# Patient Record
Sex: Female | Born: 1965
Health system: Southern US, Community
[De-identification: ages and names within clinical notes are randomized; demographics above are authoritative.]

## PROBLEM LIST (undated history)

## (undated) DIAGNOSIS — G8929 Other chronic pain: Secondary | ICD-10-CM

## (undated) DIAGNOSIS — K219 Gastro-esophageal reflux disease without esophagitis: Secondary | ICD-10-CM

## (undated) DIAGNOSIS — I471 Supraventricular tachycardia, unspecified: Secondary | ICD-10-CM

## (undated) DIAGNOSIS — I499 Cardiac arrhythmia, unspecified: Secondary | ICD-10-CM

## (undated) DIAGNOSIS — J45909 Unspecified asthma, uncomplicated: Secondary | ICD-10-CM

## (undated) DIAGNOSIS — J329 Chronic sinusitis, unspecified: Secondary | ICD-10-CM

## (undated) DIAGNOSIS — I4891 Unspecified atrial fibrillation: Secondary | ICD-10-CM

## (undated) DIAGNOSIS — J069 Acute upper respiratory infection, unspecified: Secondary | ICD-10-CM

## (undated) DIAGNOSIS — F419 Anxiety disorder, unspecified: Secondary | ICD-10-CM

## (undated) DIAGNOSIS — M1990 Unspecified osteoarthritis, unspecified site: Secondary | ICD-10-CM

## (undated) HISTORY — PX: TUBAL LIGATION: SHX77

## (undated) HISTORY — DX: Supraventricular tachycardia: I47.1

## (undated) HISTORY — DX: Chronic sinusitis, unspecified: J32.9

## (undated) HISTORY — DX: Anxiety disorder, unspecified: F41.9

## (undated) HISTORY — PX: CHOLECYSTECTOMY: SHX55

## (undated) HISTORY — DX: Acute upper respiratory infection, unspecified: J06.9

## (undated) HISTORY — DX: Other chronic pain: G89.29

## (undated) HISTORY — DX: Supraventricular tachycardia, unspecified: I47.10

---

## 2004-01-10 ENCOUNTER — Ambulatory Visit: Payer: Self-pay | Admitting: Cardiology

## 2004-01-30 ENCOUNTER — Ambulatory Visit: Payer: Self-pay | Admitting: Cardiology

## 2004-01-31 ENCOUNTER — Ambulatory Visit: Payer: Self-pay | Admitting: Internal Medicine

## 2004-02-12 ENCOUNTER — Ambulatory Visit: Payer: Self-pay | Admitting: Cardiology

## 2004-02-19 ENCOUNTER — Ambulatory Visit (HOSPITAL_COMMUNITY): Admission: RE | Admit: 2004-02-19 | Discharge: 2004-02-19 | Payer: Self-pay | Admitting: Internal Medicine

## 2004-02-19 ENCOUNTER — Ambulatory Visit: Payer: Self-pay | Admitting: Internal Medicine

## 2004-05-14 ENCOUNTER — Ambulatory Visit: Payer: Self-pay | Admitting: Internal Medicine

## 2004-10-16 ENCOUNTER — Ambulatory Visit: Payer: Self-pay | Admitting: Cardiology

## 2006-04-08 ENCOUNTER — Emergency Department (HOSPITAL_COMMUNITY): Admission: EM | Admit: 2006-04-08 | Discharge: 2006-04-08 | Payer: Self-pay | Admitting: Emergency Medicine

## 2010-07-30 ENCOUNTER — Encounter: Payer: Self-pay | Admitting: Physician Assistant

## 2010-07-30 DIAGNOSIS — E785 Hyperlipidemia, unspecified: Secondary | ICD-10-CM | POA: Insufficient documentation

## 2010-07-30 DIAGNOSIS — E1010 Type 1 diabetes mellitus with ketoacidosis without coma: Secondary | ICD-10-CM | POA: Insufficient documentation

## 2010-07-30 DIAGNOSIS — G43909 Migraine, unspecified, not intractable, without status migrainosus: Secondary | ICD-10-CM | POA: Insufficient documentation

## 2010-08-22 ENCOUNTER — Emergency Department (HOSPITAL_COMMUNITY)
Admission: EM | Admit: 2010-08-22 | Discharge: 2010-08-22 | Disposition: A | Discharge status: Home / Self Care | Payer: Medicare Other | Attending: Emergency Medicine | Admitting: Emergency Medicine

## 2010-08-22 ENCOUNTER — Emergency Department (HOSPITAL_COMMUNITY): Payer: Medicare Other

## 2010-08-22 DIAGNOSIS — E109 Type 1 diabetes mellitus without complications: Secondary | ICD-10-CM | POA: Insufficient documentation

## 2010-08-22 DIAGNOSIS — Z794 Long term (current) use of insulin: Secondary | ICD-10-CM | POA: Insufficient documentation

## 2010-08-22 DIAGNOSIS — Z79899 Other long term (current) drug therapy: Secondary | ICD-10-CM | POA: Insufficient documentation

## 2010-08-22 DIAGNOSIS — J45909 Unspecified asthma, uncomplicated: Secondary | ICD-10-CM | POA: Insufficient documentation

## 2010-08-22 DIAGNOSIS — J40 Bronchitis, not specified as acute or chronic: Secondary | ICD-10-CM | POA: Insufficient documentation

## 2010-08-22 LAB — DIFFERENTIAL
Basophils Absolute: 0.1 10*3/uL (ref 0.0–0.1)
Basophils Relative: 2 % — ABNORMAL HIGH (ref 0–1)
Eosinophils Absolute: 0.2 10*3/uL (ref 0.0–0.7)
Neutrophils Relative %: 55 % (ref 43–77)

## 2010-08-22 LAB — GLUCOSE, CAPILLARY
Glucose-Capillary: 315 mg/dL — ABNORMAL HIGH (ref 70–99)
Glucose-Capillary: 195 mg/dL — ABNORMAL HIGH (ref 70–99)

## 2010-08-22 LAB — BASIC METABOLIC PANEL
BUN: 8 mg/dL (ref 6–23)
Calcium: 8.6 mg/dL (ref 8.4–10.5)
GFR calc non Af Amer: >60 mL/min (ref >60)
Glucose, Bld: 349 mg/dL — ABNORMAL HIGH (ref 70–99)

## 2010-08-22 LAB — URINALYSIS, ROUTINE W REFLEX MICROSCOPIC
Bilirubin Urine: NEGATIVE
Ketones, ur: >80 mg/dL — AB
Nitrite: NEGATIVE
Urobilinogen, UA: 0.2 mg/dL (ref 0.0–1.0)

## 2010-08-22 LAB — CBC
HCT: 33.2 % — ABNORMAL LOW (ref 36.0–46.0)
MCHC: 33.7 g/dL (ref 30.0–36.0)
MCV: 88.1 fL (ref 78.0–100.0)
RDW: 13.6 % (ref 11.5–15.5)

## 2010-08-27 ENCOUNTER — Encounter: Payer: Self-pay | Admitting: Physician Assistant

## 2010-12-16 ENCOUNTER — Institutional Professional Consult (permissible substitution): Payer: Medicare Other | Admitting: Cardiovascular Disease

## 2010-12-19 ENCOUNTER — Other Ambulatory Visit: Payer: Self-pay

## 2010-12-19 ENCOUNTER — Emergency Department (HOSPITAL_COMMUNITY)
Admission: EM | Admit: 2010-12-19 | Discharge: 2010-12-19 | Disposition: A | Discharge status: Home / Self Care | Payer: PRIVATE HEALTH INSURANCE | Attending: Emergency Medicine | Admitting: Emergency Medicine

## 2010-12-19 ENCOUNTER — Emergency Department (HOSPITAL_COMMUNITY): Payer: PRIVATE HEALTH INSURANCE

## 2010-12-19 ENCOUNTER — Encounter (HOSPITAL_COMMUNITY): Payer: Self-pay

## 2010-12-19 DIAGNOSIS — G8929 Other chronic pain: Secondary | ICD-10-CM | POA: Insufficient documentation

## 2010-12-19 DIAGNOSIS — E1169 Type 2 diabetes mellitus with other specified complication: Secondary | ICD-10-CM | POA: Insufficient documentation

## 2010-12-19 DIAGNOSIS — R112 Nausea with vomiting, unspecified: Secondary | ICD-10-CM | POA: Insufficient documentation

## 2010-12-19 DIAGNOSIS — IMO0001 Reserved for inherently not codable concepts without codable children: Secondary | ICD-10-CM | POA: Insufficient documentation

## 2010-12-19 DIAGNOSIS — R079 Chest pain, unspecified: Secondary | ICD-10-CM | POA: Insufficient documentation

## 2010-12-19 DIAGNOSIS — G43909 Migraine, unspecified, not intractable, without status migrainosus: Secondary | ICD-10-CM | POA: Insufficient documentation

## 2010-12-19 DIAGNOSIS — F411 Generalized anxiety disorder: Secondary | ICD-10-CM | POA: Insufficient documentation

## 2010-12-19 DIAGNOSIS — Z79899 Other long term (current) drug therapy: Secondary | ICD-10-CM | POA: Insufficient documentation

## 2010-12-19 DIAGNOSIS — E162 Hypoglycemia, unspecified: Secondary | ICD-10-CM

## 2010-12-19 DIAGNOSIS — Z794 Long term (current) use of insulin: Secondary | ICD-10-CM | POA: Insufficient documentation

## 2010-12-19 LAB — CARDIAC PANEL(CRET KIN+CKTOT+MB+TROPI)
Relative Index: INVALID (ref 0.0–2.5)
Troponin I: <0.3 ng/mL (ref <0.30)

## 2010-12-19 LAB — BASIC METABOLIC PANEL
CO2: 26 mEq/L (ref 19–32)
Calcium: 8.7 mg/dL (ref 8.4–10.5)
Chloride: 100 mEq/L (ref 96–112)
Glucose, Bld: 55 mg/dL — ABNORMAL LOW (ref 70–99)
Sodium: 135 mEq/L (ref 135–145)

## 2010-12-19 LAB — CBC
HCT: 36.6 % (ref 36.0–46.0)
MCV: 88.8 fL (ref 78.0–100.0)
Platelets: 355 10*3/uL (ref 150–400)
RBC: 4.12 MIL/uL (ref 3.87–5.11)
WBC: 10.3 10*3/uL (ref 4.0–10.5)

## 2010-12-19 LAB — GLUCOSE, CAPILLARY: Glucose-Capillary: 63 mg/dL — ABNORMAL LOW (ref 70–99)

## 2010-12-19 LAB — HEPATIC FUNCTION PANEL
ALT: 19 U/L (ref 0–35)
AST: 19 U/L (ref 0–37)
Albumin: 3.7 g/dL (ref 3.5–5.2)
Bilirubin, Direct: 0.1 mg/dL (ref 0.0–0.3)
Total Protein: 7.3 g/dL (ref 6.0–8.3)

## 2010-12-19 LAB — BLOOD GAS, VENOUS
Acid-Base Excess: 0.1 mmol/L (ref 0.0–2.0)
Bicarbonate: 25 mEq/L — ABNORMAL HIGH (ref 20.0–24.0)
O2 Saturation: 49.7 %
TCO2: 23.2 mmol/L (ref 0–100)
pO2, Ven: 27.1 mmHg — CL (ref 30.0–45.0)

## 2010-12-19 LAB — LIPASE, BLOOD: Lipase: 18 U/L (ref 11–59)

## 2010-12-19 LAB — DIFFERENTIAL
Eosinophils Relative: 0 % (ref 0–5)
Lymphocytes Relative: 14 % (ref 12–46)
Lymphs Abs: 1.5 10*3/uL (ref 0.7–4.0)

## 2010-12-19 MED ORDER — DEXTROSE 50 % IV SOLN
50.0000 mL | Freq: Once | INTRAVENOUS | Status: AC
  Administered 2010-12-19: 50 mL via INTRAVENOUS

## 2010-12-19 MED ORDER — SODIUM CHLORIDE 0.9 % IV SOLN
Freq: Once | INTRAVENOUS | Status: AC
  Administered 2010-12-19: 13:00:00 via INTRAVENOUS

## 2010-12-19 MED ORDER — DEXTROSE 50 % IV SOLN
INTRAVENOUS | Status: AC
  Filled 2010-12-19: qty 50

## 2010-12-19 MED ORDER — KETOROLAC TROMETHAMINE 30 MG/ML IJ SOLN
30.0000 mg | Freq: Once | INTRAMUSCULAR | Status: AC
  Administered 2010-12-19: 30 mg via INTRAVENOUS
  Filled 2010-12-19: qty 1

## 2010-12-19 MED ORDER — METOCLOPRAMIDE HCL 5 MG/ML IJ SOLN
10.0000 mg | Freq: Once | INTRAMUSCULAR | Status: AC
  Administered 2010-12-19: 10 mg via INTRAVENOUS
  Filled 2010-12-19: qty 2

## 2010-12-19 MED ORDER — DIPHENHYDRAMINE HCL 25 MG PO CAPS
25.0000 mg | ORAL_CAPSULE | Freq: Once | ORAL | Status: AC
  Administered 2010-12-19: 25 mg via ORAL
  Filled 2010-12-19: qty 1

## 2010-12-19 MED ORDER — ONDANSETRON HCL 4 MG/2ML IJ SOLN
4.0000 mg | Freq: Once | INTRAMUSCULAR | Status: AC
  Administered 2010-12-19: 4 mg via INTRAVENOUS
  Filled 2010-12-19: qty 2

## 2010-12-19 MED ORDER — SODIUM CHLORIDE 0.9 % IV BOLUS (SEPSIS)
1000.0000 mL | Freq: Once | INTRAVENOUS | Status: AC
  Administered 2010-12-19: 1000 mL via INTRAVENOUS

## 2010-12-19 NOTE — ED Notes (Signed)
Pt and family updated on plan of care,  

## 2010-12-19 NOTE — ED Notes (Signed)
Pt also states that she has been having chest pain off and on since having her mi three weeks ago, pt placed on cardiac monitor, ekg done

## 2010-12-19 NOTE — ED Notes (Signed)
Pt states that she has been having problems with her blood sugar going up and down  since having a mi three weeks ago at [facility] in [place], pt also has migraine headache that started this am along with n/v, pt dry heaving in tx room, attempted to keep oj down in order to bring her blood sugar up but has been unable to

## 2010-12-19 NOTE — ED Provider Notes (Signed)
History     CSN: [id number] Arrival date & time: 12/19/2010 12:54 PM  Chief Complaint  Patient presents with  . Emesis  . Headache  . Hypoglycemia    HPI  (Consider location/radiation/quality/duration/timing/severity/associated sxs/prior treatment)  HPI Comments: This is type I diabetic presenting with labile blood sugars for the past 2 days. She reports her blood sugar was 60 this morning and she was unable to tolerate orange juice to bring it up. She also complains of a typical migraine headache is exactly like her previous and gradual in onset. She's had multiple episodes of nausea and vomiting today unable to keep anything down. This is associated with diffuse abdominal pain and intermittent chest pain. Her sensation having intermittent chest pain for the past 3 weeks it comes and goes every few seconds. She states she had a heart attack in [facility] on September 8. However her discharge summary shows that she had atypical chest pain and was ruled out 3 negative cardiac enzymes. She did have DKA was admitted to the ICU insulin drip for 2 days. She reports compliance with her insulin dosage and took her regular 8 units of Lantus this morning as well as yesterday. She has had no by mouth intake today and has not taken any sliding scale.  The history is provided by the patient.    Past Medical History  Diagnosis Date  . Diabetes with ketoacidosis   . URI (upper respiratory infection)   . Sinusitis   . Chronic anxiety   . Chronic pain   . Diabetes mellitus     Past Surgical History  Procedure Date  . Tubal ligation     History reviewed. No pertinent family history.  History  Substance Use Topics  . Smoking status: Never Smoker   . Smokeless tobacco: Not on file  . Alcohol Use: No    OB History    Grav Para Term Preterm Abortions TAB SAB Ect Mult Living                  Review of Systems  Review of Systems  Constitutional: Positive for activity change,  appetite change and fatigue. Negative for fever.  HENT: Negative for congestion and rhinorrhea.   Eyes: Negative for visual disturbance.  Respiratory: Positive for chest tightness. Negative for cough and shortness of breath.   Cardiovascular: Positive for chest pain.  Gastrointestinal: Positive for vomiting.  Genitourinary: Negative for dysuria and hematuria.  Musculoskeletal: Positive for myalgias and arthralgias. Negative for back pain.  Neurological: Positive for headaches. Negative for dizziness and weakness.    Allergies  Review of patient's allergies indicates no known allergies.  Home Medications   Current Outpatient Rx  Name Route Sig Dispense Refill  . ALPRAZOLAM 0.5 MG PO TABS Oral Take 0.5 mg by mouth at bedtime as needed.      . ASPIRIN 81 MG PO TABS Oral Take 81 mg by mouth daily.      . ATORVASTATIN CALCIUM 40 MG PO TABS Oral Take 40 mg by mouth at bedtime as needed.      Marland Kitchen PRODIGY AUTOCODE BLOOD GLUCOSE DEVI Does not apply by Does not apply route 3 (three) times daily.      . ALLEGRA-D PO Oral Take by mouth.      Marland Kitchen GLUCOSE BLOOD VI STRP Other 1 each by Other route 3 (three) times daily. Use as instructed     . HYDROCHLOROTHIAZIDE 25 MG PO TABS Oral Take 25 mg by mouth daily.      Marland Kitchen  INSULIN ASPART 100 UNIT/ML [place] SOLN Subcutaneous Inject 4 Units into the skin 3 (three) times daily before meals. Sliding scale 4 units+     . INSULIN GLARGINE 100 UNIT/ML [place] SOLN Subcutaneous Inject 30 Units into the skin at bedtime.      Marland Kitchen LEVOXYL PO Oral Take 50 mcg by mouth every morning.      Marland Kitchen OMEPRAZOLE 20 MG PO CPDR Oral Take 20 mg by mouth daily.      . OXYCODONE-ACETAMINOPHEN 7.5-500 MG PO TABS Oral Take 1 tablet by mouth as directed.      Marland Kitchen PRODIGY LANCETS 21G MISC Does not apply by Does not apply route 3 (three) times daily.      Marland Kitchen SIMVASTATIN 40 MG PO TABS Oral Take 40 mg by mouth at bedtime.        Physical Exam    BP 129/83  Pulse 94  Temp(Src) 98.3 F (36.8 C) (Oral)   Resp 24  Ht 5\' 8"  (1.727 m)  Wt 190 lb (86.183 kg)  BMI 28.89 kg/m2  SpO2 100%  Physical Exam  Constitutional: She is oriented to person, place, and time. She appears well-developed and well-nourished. No distress.  HENT:  Head: Normocephalic and atraumatic.  Mouth/Throat: Oropharynx is clear and moist. No oropharyngeal exudate.  Eyes: Conjunctivae are normal. Pupils are equal, round, and reactive to light.  Neck: Normal range of motion.  Cardiovascular: Normal rate, regular rhythm and normal heart sounds.   Pulmonary/Chest: Effort normal and breath sounds normal. No respiratory distress.  Abdominal: Soft. There is no tenderness. There is no rebound and no guarding.  Musculoskeletal: Normal range of motion. She exhibits no edema and no tenderness.  Neurological: She is alert and oriented to person, place, and time. No cranial nerve deficit.       Cranial nerves II through XII intact, 5 of 5 strength throughout, no focal deficits  Skin: Skin is warm.    ED Course  Procedures (including critical care time)  Labs Reviewed  GLUCOSE, CAPILLARY - Abnormal; Notable for the following:    Glucose-Capillary 61 (*)    All other components within normal limits  DIFFERENTIAL - Abnormal; Notable for the following:    Neutrophils Relative 82 (*)    Neutro Abs 8.5 (*)    All other components within normal limits  CBC  BASIC METABOLIC PANEL  BLOOD GAS, VENOUS  LACTIC ACID, PLASMA  LIPASE, BLOOD  HEPATIC FUNCTION PANEL  I-STAT TROPONIN I   No results found.   No diagnosis found.   MDM Nausea, vomiting, hypoglycemia with body aches and typical migraine headaches. Patient is afebrile and stable vital signs. We'll obtain electrolytes, ABG, provide IV hydration antiemetics. Abdomen soft and nontender. Records obtained from [facility] show that she actually did not have a heart attack as she reports,, we will obtain EKG to evaluate her atypical chest pain.   Date: 12/19/2010  Rate:  70  Rhythm: normal sinus rhythm  QRS Axis: normal  Intervals: normal  ST/T Wave abnormalities: normal  Conduction Disutrbances:none  Narrative Interpretation:   Old EKG Reviewed: none available  Patient feeling better after fluids and antiemetics. She is actually requesting discharge. States her headache is resolved she's had no further chest pain. Sugar is in the low side but she is tolerating by mouth in the department. We'll recheck sugar here before discharge.  Last blood sugar was 100. Her electrolytes are within normal limits and her troponin is negative. Not concerned about cardiac cause for  chest pain as it has been bleeding every few seconds for 3 weeks. This is very typical for ACS and she has no EKG changes.  Results for orders placed during the hospital encounter of 12/19/10  GLUCOSE, CAPILLARY      Component Value Range   Glucose-Capillary 61 (*) 70 - 99 (mg/dL)  CBC      Component Value Range   WBC 10.3  4.0 - 10.5 (K/uL)   RBC 4.12  3.87 - 5.11 (MIL/uL)   Hemoglobin 12.6  12.0 - 15.0 (g/dL)   HCT 36.6  36.0 - 46.0 (%)   MCV 88.8  78.0 - 100.0 (fL)   MCH 30.6  26.0 - 34.0 (pg)   MCHC 34.4  30.0 - 36.0 (g/dL)   RDW 13.3  11.5 - 15.5 (%)   Platelets 355  150 - 400 (K/uL)  DIFFERENTIAL      Component Value Range   Neutrophils Relative 82 (*) 43 - 77 (%)   Neutro Abs 8.5 (*) 1.7 - 7.7 (K/uL)   Lymphocytes Relative 14  12 - 46 (%)   Lymphs Abs 1.5  0.7 - 4.0 (K/uL)   Monocytes Relative 3  3 - 12 (%)   Monocytes Absolute 0.3  0.1 - 1.0 (K/uL)   Eosinophils Relative 0  0 - 5 (%)   Eosinophils Absolute 0.0  0.0 - 0.7 (K/uL)   Basophils Relative 0  0 - 1 (%)   Basophils Absolute 0.0  0.0 - 0.1 (K/uL)  BASIC METABOLIC PANEL      Component Value Range   Sodium 135  135 - 145 (mEq/L)   Potassium 3.2 (*) 3.5 - 5.1 (mEq/L)   Chloride 100  96 - 112 (mEq/L)   CO2 26  19 - 32 (mEq/L)   Glucose, Bld 55 (*) 70 - 99 (mg/dL)   BUN 9  6 - 23 (mg/dL)   Creatinine, Ser 0.50  0.50  - 1.10 (mg/dL)   Calcium 8.7  8.4 - 10.5 (mg/dL)   GFR calc non Af Amer >60  >60 (mL/min)   GFR calc Af Amer >60  >60 (mL/min)  LACTIC ACID, PLASMA      Component Value Range   Lactic Acid, Venous 1.5  0.5 - 2.2 (mmol/L)  LIPASE, BLOOD      Component Value Range   Lipase 18  11 - 59 (U/L)  HEPATIC FUNCTION PANEL      Component Value Range   Total Protein 7.3  6.0 - 8.3 (g/dL)   Albumin 3.7  3.5 - 5.2 (g/dL)   AST 19  0 - 37 (U/L)   ALT 19  0 - 35 (U/L)   Alkaline Phosphatase 134 (*) 39 - 117 (U/L)   Total Bilirubin 0.2 (*) 0.3 - 1.2 (mg/dL)   Bilirubin, Direct 0.1  0.0 - 0.3 (mg/dL)   Indirect Bilirubin 0.1 (*) 0.3 - 0.9 (mg/dL)  BLOOD GAS, VENOUS      Component Value Range   FIO2 21.00     pH, Ven 7.371 (*) 7.250 - 7.300    pCO2, Ven 44.2 (*) 45.0 - 50.0 (mmHg)   pO2, Ven 27.1 (*) 30.0 - 45.0 (mmHg)   Bicarbonate 25.0 (*) 20.0 - 24.0 (mEq/L)   TCO2 23.2  0 - 100 (mmol/L)   Acid-Base Excess 0.1  0.0 - 2.0 (mmol/L)   O2 Saturation 49.7     Patient temperature 37.0     Collection site HEEL OF FOOT     Drawn by  OUTSIDE SERVICE OPLAB     Sample type VENOUS    CARDIAC PANEL(CRET KIN+CKTOT+MB+TROPI)      Component Value Range   Total CK 43  7 - 177 (U/L)   CK, MB 1.6  0.3 - 4.0 (ng/mL)   Troponin I <0.30  <0.30 (ng/mL)   Relative Index RELATIVE INDEX IS INVALID  0.0 - 2.5   GLUCOSE, CAPILLARY      Component Value Range   Glucose-Capillary 100 (*) 70 - 99 (mg/dL)   Dg Chest Portable 1 View  12/19/2010  *RADIOLOGY REPORT*  Clinical Data: Chest pain  PORTABLE CHEST - 1 VIEW  Comparison: 08/22/2010  Findings: Heart size upper limits normal.  Lungs are clear.  No effusion.  Regional bones unremarkable.  Azygos fissure noted.  IMPRESSION:  1.  Borderline cardiomegaly.  Original Report Authenticated By: Trecia Rogers, M.D.           Ezequiel Essex, MD 12/19/10 [phone]

## 2010-12-19 NOTE — ED Notes (Signed)
Lab called with results of venous blood gas, EDP informed of results

## 2010-12-19 NOTE — ED Notes (Signed)
Pt brought in by mother for hypoglycemia and headache. Per mother pt glucose was 60 this am and pt has vomited OJ that pt attempted to drink. Pt to triage via w/c.

## 2010-12-19 NOTE — ED Notes (Signed)
EDP aware of blood sugar, pt given coke and clear liquids

## 2010-12-19 NOTE — ED Notes (Signed)
Pt states that she is feeling better,  

## 2010-12-19 NOTE — ED Notes (Signed)
Pt trop labwork done in er showed stars across the top, EDP advised to have lab do cardiac panel

## 2010-12-19 NOTE — ED Notes (Signed)
bsl 100

## 2011-01-04 ENCOUNTER — Encounter: Payer: Self-pay | Admitting: Cardiovascular Disease

## 2011-01-04 ENCOUNTER — Institutional Professional Consult (permissible substitution): Payer: Medicare Other | Admitting: Cardiovascular Disease

## 2011-01-04 ENCOUNTER — Ambulatory Visit (INDEPENDENT_AMBULATORY_CARE_PROVIDER_SITE_OTHER): Payer: Medicare Other | Admitting: Cardiovascular Disease

## 2011-01-04 DIAGNOSIS — R079 Chest pain, unspecified: Secondary | ICD-10-CM | POA: Insufficient documentation

## 2011-01-04 NOTE — Assessment & Plan Note (Signed)
Daily chest pain in a diabetic female. Will get an exercise myoview to exclude ischemia and echo to exclude structural heart disease.

## 2011-01-04 NOTE — Progress Notes (Signed)
History of Present Illness:44 yo WF with h/o DM, SVT s/p ablation per Dr. Lovena Le in 2005  who is here today as a new patient for cardiac evaluation. She was hospitalized at [facility] in [place], [place] with diabetic ketoacidosis 4 weeks ago. She had some chest pain during that admission and at her f/u appt in [place] Keeler, PA-C), she asked for cardiology referral.   She tells me that she has daily chest pain. The pain seems to worsen with exertion. This is described as a pressure. No radiation. Her left arm goes numb at times. Occasional SOB. She also has occasional palpitations. No prolonged runs of tachycardia. She has been on a PPI in the past for possible GERD but no help with her chest pressure.   Past Medical History  Diagnosis Date  . Diabetes with ketoacidosis   . URI (upper respiratory infection)   . Sinusitis   . Chronic anxiety   . Chronic pain   . Diabetes mellitus     Diagnosed 1997  . SVT (supraventricular tachycardia)     Ablation per Dr. Lovena Le in November 2005    Past Surgical History  Procedure Date  . Tubal ligation     Current Outpatient Prescriptions  Medication Sig Dispense Refill  . aspirin 81 MG tablet Take 81 mg by mouth daily.        . Blood Glucose Monitoring Suppl (PRODIGY AUTOCODE BLOOD GLUCOSE) DEVI by Does not apply route 3 (three) times daily.        Marland Kitchen glucose blood (PRODIGY AUTOCODE TEST) test strip 1 each by Other route 3 (three) times daily. Use as instructed       . insulin aspart (NOVOLOG) 100 UNIT/ML injection Inject 4 Units into the skin 3 (three) times daily before meals. Sliding scale 4 units+       . insulin glargine (LANTUS) 100 UNIT/ML injection Inject 30 Units into the skin at bedtime.        Marland Kitchen PRODIGY LANCETS 21G MISC by Does not apply route 3 (three) times daily.          No Known Allergies  History   Social History  . Marital Status: Married    Spouse Name: N/A    Number of Children: N/A  .  Years of Education: N/A   Occupational History  . Not on file.   Social History Main Topics  . Smoking status: Never Smoker   . Smokeless tobacco: Not on file  . Alcohol Use: No  . Drug Use: No  . Sexually Active:    Other Topics Concern  . Not on file   Social History Narrative  . No narrative on file    No family history on file.  Review of Systems:  As stated in the HPI and otherwise negative.   BP 120/70  Pulse 65  Resp 18  Ht 5\' 8"  (1.727 m)  Wt 178 lb 1.9 oz (80.795 kg)  BMI 27.08 kg/m2  Physical Examination: General: Well developed, well nourished, NAD HEENT: OP clear, mucus membranes moist SKIN: warm, dry. No rashes. Neuro: No focal deficits Musculoskeletal: Muscle strength 5/5 all ext Psychiatric: Mood and affect normal Neck: No JVD, no carotid bruits, no thyromegaly, no lymphadenopathy. Lungs:Clear bilaterally, no wheezes, rhonci, crackles Cardiovascular: Regular rate and rhythm. No murmurs, gallops or rubs. Abdomen:Soft. Bowel sounds present. Non-tender.  Extremities: No lower extremity edema. Pulses are 2 + in the bilateral DP/PT.  EKG: Normal sinus rhythm, rate 65.

## 2011-01-04 NOTE — Patient Instructions (Signed)
Your physician recommends that you schedule a follow-up appointment in:2-3 weeks  Your physician has requested that you have en exercise stress myoview. For further information please visit [email or web address]. Please follow instruction sheet, as given.  Your physician has requested that you have an echocardiogram. Echocardiography is a painless test that uses sound waves to create images of your heart. It provides your doctor with information about the size and shape of your heart and how well your heart's chambers and valves are working. This procedure takes approximately one hour. There are no restrictions for this procedure.

## 2011-02-01 ENCOUNTER — Ambulatory Visit (HOSPITAL_COMMUNITY): Payer: PRIVATE HEALTH INSURANCE | Attending: Cardiology | Admitting: Radiology

## 2011-02-01 ENCOUNTER — Other Ambulatory Visit (HOSPITAL_COMMUNITY): Payer: Medicare Other | Admitting: Radiology

## 2011-02-01 ENCOUNTER — Encounter (HOSPITAL_COMMUNITY): Payer: Medicare Other | Admitting: Radiology

## 2011-02-01 DIAGNOSIS — I379 Nonrheumatic pulmonary valve disorder, unspecified: Secondary | ICD-10-CM | POA: Insufficient documentation

## 2011-02-01 DIAGNOSIS — I059 Rheumatic mitral valve disease, unspecified: Secondary | ICD-10-CM | POA: Insufficient documentation

## 2011-02-01 DIAGNOSIS — R0989 Other specified symptoms and signs involving the circulatory and respiratory systems: Secondary | ICD-10-CM | POA: Insufficient documentation

## 2011-02-01 DIAGNOSIS — I498 Other specified cardiac arrhythmias: Secondary | ICD-10-CM | POA: Insufficient documentation

## 2011-02-01 DIAGNOSIS — R002 Palpitations: Secondary | ICD-10-CM | POA: Insufficient documentation

## 2011-02-01 DIAGNOSIS — I079 Rheumatic tricuspid valve disease, unspecified: Secondary | ICD-10-CM | POA: Insufficient documentation

## 2011-02-01 DIAGNOSIS — R0789 Other chest pain: Secondary | ICD-10-CM

## 2011-02-01 DIAGNOSIS — E119 Type 2 diabetes mellitus without complications: Secondary | ICD-10-CM | POA: Insufficient documentation

## 2011-02-01 DIAGNOSIS — R072 Precordial pain: Secondary | ICD-10-CM

## 2011-02-01 DIAGNOSIS — R0602 Shortness of breath: Secondary | ICD-10-CM

## 2011-02-01 DIAGNOSIS — R0609 Other forms of dyspnea: Secondary | ICD-10-CM | POA: Insufficient documentation

## 2011-02-01 DIAGNOSIS — R079 Chest pain, unspecified: Secondary | ICD-10-CM | POA: Insufficient documentation

## 2011-02-01 MED ORDER — TECHNETIUM TC 99M TETROFOSMIN IV KIT
11.0000 | PACK | Freq: Once | INTRAVENOUS | Status: AC | PRN
  Administered 2011-02-01: 11 via INTRAVENOUS

## 2011-02-01 MED ORDER — TECHNETIUM TC 99M TETROFOSMIN IV KIT
33.0000 | PACK | Freq: Once | INTRAVENOUS | Status: AC | PRN
  Administered 2011-02-01: 33 via INTRAVENOUS

## 2011-02-01 NOTE — Progress Notes (Signed)
[place] [address] [phone]  Cardiology Nuclear Med Study  Kathleen Norris is a 44 y.o. female [id number] 13-Aug-1965   Nuclear Med Background Indication for Stress Test:  Evaluation for Ischemia and 9/12 [facility]: Diabetic Ketoacidosis and Chest Pain, MI ruled out [name]) History:  No previous documented CAD, '05 Ablation (SVT), and '00 Myocardial Perfusion Study ([facility])No Report Cardiac Risk Factors: Family History - CAD, IDDM Type 1, and TIA  Symptoms:  Chest Pain, and Chest Pressure with and without Exertion (Chronic x 2 1/2 months,(last date of chest discomfort Today), DOE, Fatigue, Fatigue with Exertion, Nausea, Palpitations and SOB   Nuclear Pre-Procedure Caffeine/Decaff Intake:  None NPO After: 11:00pm   Lungs:  Clear IV 0.9% NS with Angio Cath:  22g  IV Site: R Hand  IV Started by:  Eliezer Lofts, EMT-P  Chest Size (in):  38 Cup Size: C  Height: 5\' 8"  (1.727 m)  Weight:  178 lb (80.74 kg)  BMI:  Body mass index is 27.06 kg/(m^2). Tech Comments:  CBG= 382 @ 6 am, per patient.    Nuclear Med Study 1 or 2 day study: 1 day  Stress Test Type:  Stress  Reading MD: Kirk Ruths, MD  Order Authorizing Provider:  Darlina Guys, MD  Resting Radionuclide: Technetium 6m Tetrofosmin  Resting Radionuclide Dose: 11 mCi   Stress Radionuclide:  Technetium 94m Tetrofosmin  Stress Radionuclide Dose: 33 mCi           Stress Protocol Rest HR: 70 Stress HR: 169  Rest BP: 128/87 Stress BP: 156/83  Exercise Time (min): 7:30 METS: 9.3   Predicted Max HR: 177 bpm % Max HR: 95.48 bpm Rate Pressure Product: [phone]   Dose of Adenosine (mg):  n/a Dose of Lexiscan: n/a mg  Dose of Atropine (mg): n/a Dose of Dobutamine: n/a mcg/kg/min (at max HR)  Stress Test Technologist: Irven Baltimore, RN  Nuclear Technologist:  Charlton Amor, CNMT     Rest Procedure:  Myocardial perfusion imaging was performed at rest 45  minutes following the intravenous administration of Technetium 50m Tetrofosmin. Rest ECG: NSR with Poor R wave Progression  Stress Procedure:  The patient exercised for 7 minutes and 30 seconds, RPE=15. The patient stopped due to DOE, Fatigue and Chest Pain at baseline 7.5/10 (chronic) with gradual increase at peak exercise to 8/10.  There were nonspecific ST-T wave changes. There was a rare PAC. There was a decrease BP to 148/71 immediately post exercise, without dizziness.  Technetium 49m Tetrofosmin was injected at peak exercise and myocardial perfusion imaging was performed after a brief delay. Stress ECG: Significant ST abnormalities consistent with ischemia.  QPS Raw Data Images:  Acquisition technically good; normal left ventricular size. Stress Images:  There is decreased uptake in the distal anterior wall and apex. Rest Images:  There is decreased uptake in the distal anterior wall. Subtraction (SDS):  These findings are consistent with soft tissue attenuation; cannot exclude very mild apical ischemia. Transient Ischemic Dilatation (Normal <1.22):  1.14 Lung/Heart Ratio (Normal <0.45):  .26  Quantitative Gated Spect Images QGS EDV:  92 ml QGS ESV:  39 ml QGS cine images:  NL LV Function; NL Wall Motion QGS EF: 57%  Impression Exercise Capacity:  Fair exercise capacity. BP Response:  Normal blood pressure response. Clinical Symptoms:  There is chest pain. ECG Impression:  Significant ST abnormalities consistent with ischemia. Comparison with Prior Nuclear Study: No images to compare  Overall Impression:  Low risk stress nuclear study with a small, partially reversible defect in the distal anterior wall and apex consistent with soft tissue attenuation; cannot exclude very mild apical ischemia.   Kirk Ruths

## 2011-02-09 ENCOUNTER — Ambulatory Visit: Payer: Self-pay | Admitting: Cardiovascular Disease

## 2011-02-12 ENCOUNTER — Encounter: Payer: Self-pay | Admitting: *Deleted

## 2011-02-12 ENCOUNTER — Ambulatory Visit (INDEPENDENT_AMBULATORY_CARE_PROVIDER_SITE_OTHER): Payer: PRIVATE HEALTH INSURANCE | Admitting: Cardiovascular Disease

## 2011-02-12 ENCOUNTER — Ambulatory Visit: Payer: Medicare Other | Admitting: Cardiovascular Disease

## 2011-02-12 ENCOUNTER — Encounter: Payer: Self-pay | Admitting: Cardiovascular Disease

## 2011-02-12 VITALS — BP 116/73 | HR 80 | Ht 68.0 in | Wt 181.0 lb

## 2011-02-12 DIAGNOSIS — R079 Chest pain, unspecified: Secondary | ICD-10-CM

## 2011-02-12 DIAGNOSIS — R9439 Abnormal result of other cardiovascular function study: Secondary | ICD-10-CM

## 2011-02-12 LAB — CBC WITH DIFFERENTIAL/PLATELET
Basophils Relative: 0.6 % (ref 0.0–3.0)
Eosinophils Absolute: 0.1 10*3/uL (ref 0.0–0.7)
HCT: 39.2 % (ref 36.0–46.0)
Lymphs Abs: 2 10*3/uL (ref 0.7–4.0)
MCHC: 33.9 g/dL (ref 30.0–36.0)
MCV: 91.4 fl (ref 78.0–100.0)
Monocytes Absolute: 0.4 10*3/uL (ref 0.1–1.0)
Neutrophils Relative %: 60.4 % (ref 43.0–77.0)
RBC: 4.29 Mil/uL (ref 3.87–5.11)

## 2011-02-12 LAB — BASIC METABOLIC PANEL
BUN: 10 mg/dL (ref 6–23)
CO2: 26 mEq/L (ref 19–32)
Calcium: 8.7 mg/dL (ref 8.4–10.5)
GFR: 149.33 mL/min (ref >60.00)
Glucose, Bld: 161 mg/dL — ABNORMAL HIGH (ref 70–99)

## 2011-02-12 NOTE — Assessment & Plan Note (Signed)
See above

## 2011-02-12 NOTE — Progress Notes (Signed)
History of Present Illness: 45 yo WF with h/o DM, SVT s/p ablation per Dr. Lovena Le in 2005 who is here today for cardiac follow up. I saw her as a new patient one month ago for w/u of chest pain.  She was hospitalized at [facility] in [place], [place] with diabetic ketoacidosis in September 2012.  She had some chest pain during that admission and at her f/u appt in [place] Worthy Keeler, PA-C), she asked for cardiology referral.  She told  me that she has daily chest pain. The pain seems to worsen with exertion. This is described as a pressure. No radiation. Her left arm goes numb at times. Occasional SOB. She also has occasional palpitations. No prolonged runs of tachycardia. She has been on a PPI in the past for possible GERD but no help with her chest pressure. I ordered an echo which was normal. Her stress myoview showed possible apical ischemia with normal LVEF and wall motion.  She is here today for follow up and tells me that she is still having chest pain. It is constant and never goes away. Slight worsening with exertion.    Past Medical History  Diagnosis Date  . Diabetes with ketoacidosis   . URI (upper respiratory infection)   . Sinusitis   . Chronic anxiety   . Chronic pain   . Diabetes mellitus     Diagnosed 1997  . SVT (supraventricular tachycardia)     Ablation per Dr. Lovena Le in November 2005    Past Surgical History  Procedure Date  . Tubal ligation     Current Outpatient Prescriptions  Medication Sig Dispense Refill  . ALPRAZolam (XANAX XR) 0.5 MG 24 hr tablet Take 0.5 mg by mouth every morning.        Marland Kitchen aspirin 81 MG tablet Take 81 mg by mouth daily.        . Blood Glucose Monitoring Suppl (PRODIGY AUTOCODE BLOOD GLUCOSE) DEVI by Does not apply route 3 (three) times daily.        Marland Kitchen glucose blood (PRODIGY AUTOCODE TEST) test strip 1 each by Other route 3 (three) times daily. Use as instructed       . insulin aspart (NOVOLOG) 100 UNIT/ML  injection Inject 4 Units into the skin 3 (three) times daily before meals. Sliding scale 4 units+       . insulin glargine (LANTUS) 100 UNIT/ML injection Inject 60 Units into the skin daily.       . NON FORMULARY Pt does take thyroid med but she is out       . PRODIGY LANCETS 21G MISC by Does not apply route 3 (three) times daily.          No Known Allergies  History   Social History  . Marital Status: Married    Spouse Name: N/A    Number of Children: 4  . Years of Education: N/A   Occupational History  . Unemployed-Disability    Social History Main Topics  . Smoking status: Never Smoker   . Smokeless tobacco: Not on file  . Alcohol Use: No  . Drug Use: No  . Sexually Active:    Other Topics Concern  . Not on file   Social History Narrative  . No narrative on file    Family History  Problem Relation Age of Onset  . Heart disease Maternal Uncle     Review of Systems:  As stated in the HPI and otherwise negative.  BP 116/73  Pulse 80  Ht 5\' 8"  (1.727 m)  Wt 181 lb (82.101 kg)  BMI 27.52 kg/m2  Physical Examination: General: Well developed, well nourished, NAD HEENT: OP clear, mucus membranes moist SKIN: warm, dry. No rashes. Neuro: No focal deficits Musculoskeletal: Muscle strength 5/5 all ext Psychiatric: Mood and affect normal Neck: No JVD, no carotid bruits, no thyromegaly, no lymphadenopathy. Lungs:Clear bilaterally, no wheezes, rhonci, crackles Cardiovascular: Regular rate and rhythm. No murmurs, gallops or rubs. Abdomen:Soft. Bowel sounds present. Non-tender.  Extremities: No lower extremity edema. Pulses are 2 + in the bilateral DP/PT.  Echo: 02/01/11:  Left ventricle: The cavity size was normal. Wall thickness was normal. Systolic function was normal. The estimated ejection fraction was in the range of 55% to 60%. Wall motion was normal; there were no regional wall motion abnormalities. Left ventricular diastolic function parameters were  normal.   Stress myoview: 02/01/11: Stress Procedure: The patient exercised for 7 minutes and 30 seconds, RPE=15. The patient stopped due to DOE, Fatigue and Chest Pain at baseline 7.5/10 (chronic) with gradual increase at peak exercise to 8/10. There were nonspecific ST-T wave changes. There was a rare PAC. There was a decrease BP to 148/71 immediately post exercise, without dizziness. Technetium 64m Tetrofosmin was injected at peak exercise and myocardial perfusion imaging was performed after a brief delay.  Stress ECG: Significant ST abnormalities consistent with ischemia.  QPS  Raw Data Images: Acquisition technically good; normal left ventricular size.  Stress Images: There is decreased uptake in the distal anterior wall and apex.  Rest Images: There is decreased uptake in the distal anterior wall.  Subtraction (SDS): These findings are consistent with soft tissue attenuation; cannot exclude very mild apical ischemia.  Transient Ischemic Dilatation (Normal <1.22): 1.14  Lung/Heart Ratio (Normal <0.45): .26  Quantitative Gated Spect Images  QGS EDV: 92 ml  QGS ESV: 39 ml  QGS cine images: NL LV Function; NL Wall Motion  QGS EF: 57%  Impression  Exercise Capacity: Fair exercise capacity.  BP Response: Normal blood pressure response.  Clinical Symptoms: There is chest pain.  ECG Impression: Significant ST abnormalities consistent with ischemia.  Comparison with Prior Nuclear Study: No images to compare  Overall Impression: Low risk stress nuclear study with a small, partially reversible defect in the distal anterior wall and apex consistent with soft tissue attenuation; cannot exclude very mild apical ischemia.

## 2011-02-12 NOTE — Assessment & Plan Note (Signed)
She has daily chest pains although atypical. Her stress test is abnormal suggesting apical ischemia. She has personal risk factors for CAD including DM with recent DKA suggesting poor control. Will arrange left heart cath with possible PCI on 02/16/11 in the main cath lab at [facility]. Risks and benefits reviewed with pt. She agrees to proceed. Pre-cath labs today.

## 2011-02-12 NOTE — Patient Instructions (Signed)
Your physician recommends that you schedule a follow-up appointment in: 3 weeks.   Your physician has requested that you have a cardiac catheterization. Cardiac catheterization is used to diagnose and/or treat various heart conditions. Doctors may recommend this procedure for a number of different reasons. The most common reason is to evaluate chest pain. Chest pain can be a symptom of coronary artery disease (CAD), and cardiac catheterization can show whether plaque is narrowing or blocking your heart's arteries. This procedure is also used to evaluate the valves, as well as measure the blood flow and oxygen levels in different parts of your heart. For further information please visit [email or web address]. Please follow instruction sheet, as given. To be done on February 16, 2011

## 2011-02-15 ENCOUNTER — Encounter (HOSPITAL_COMMUNITY): Payer: Self-pay | Admitting: Pharmacy Technician

## 2011-02-16 ENCOUNTER — Other Ambulatory Visit: Payer: Self-pay

## 2011-02-16 ENCOUNTER — Ambulatory Visit (HOSPITAL_COMMUNITY)
Admission: RE | Admit: 2011-02-16 | Discharge: 2011-02-16 | Disposition: A | Discharge status: Home / Self Care | Payer: PRIVATE HEALTH INSURANCE | Source: Ambulatory Visit | Attending: Cardiovascular Disease | Admitting: Cardiovascular Disease

## 2011-02-16 ENCOUNTER — Encounter (HOSPITAL_COMMUNITY)
Admission: RE | Disposition: A | Discharge status: Home / Self Care | Payer: Self-pay | Source: Ambulatory Visit | Attending: Cardiovascular Disease

## 2011-02-16 DIAGNOSIS — R9439 Abnormal result of other cardiovascular function study: Secondary | ICD-10-CM

## 2011-02-16 DIAGNOSIS — E119 Type 2 diabetes mellitus without complications: Secondary | ICD-10-CM | POA: Insufficient documentation

## 2011-02-16 DIAGNOSIS — R079 Chest pain, unspecified: Secondary | ICD-10-CM

## 2011-02-16 DIAGNOSIS — Z01810 Encounter for preprocedural cardiovascular examination: Secondary | ICD-10-CM | POA: Insufficient documentation

## 2011-02-16 HISTORY — PX: LEFT HEART CATHETERIZATION WITH CORONARY ANGIOGRAM: SHX5451

## 2011-02-16 LAB — GLUCOSE, CAPILLARY
Glucose-Capillary: 96 mg/dL (ref 70–99)
Glucose-Capillary: 117 mg/dL — ABNORMAL HIGH (ref 70–99)
Glucose-Capillary: 42 mg/dL — CL (ref 70–99)
Glucose-Capillary: 57 mg/dL — ABNORMAL LOW (ref 70–99)

## 2011-02-16 SURGERY — LEFT HEART CATHETERIZATION WITH CORONARY ANGIOGRAM
Anesthesia: LOCAL

## 2011-02-16 MED ORDER — MIDAZOLAM HCL 2 MG/2ML IJ SOLN
INTRAMUSCULAR | Status: AC
  Filled 2011-02-16: qty 2

## 2011-02-16 MED ORDER — SODIUM CHLORIDE 0.9 % IV SOLN: 1.0000 mL/kg/h | INTRAVENOUS | Status: DC

## 2011-02-16 MED ORDER — SODIUM CHLORIDE 0.9 % IV SOLN
INTRAVENOUS | Status: DC
  Administered 2011-02-16: 07:00:00 via INTRAVENOUS

## 2011-02-16 MED ORDER — ASPIRIN 81 MG PO CHEW: 324.0000 mg | CHEWABLE_TABLET | ORAL | Status: DC

## 2011-02-16 MED ORDER — FENTANYL CITRATE 0.05 MG/ML IJ SOLN
INTRAMUSCULAR | Status: AC
  Filled 2011-02-16: qty 2

## 2011-02-16 MED ORDER — ONDANSETRON HCL 4 MG/2ML IJ SOLN: 4.0000 mg | Freq: Four times a day (QID) | INTRAMUSCULAR | Status: DC | PRN

## 2011-02-16 MED ORDER — SODIUM CHLORIDE 0.9 % IV SOLN: INTRAVENOUS | Status: DC

## 2011-02-16 MED ORDER — INSULIN ASPART 100 UNIT/ML ~~LOC~~ SOLN: 0.0000 [IU] | SUBCUTANEOUS | Status: DC

## 2011-02-16 MED ORDER — LIDOCAINE HCL (PF) 1 % IJ SOLN
INTRAMUSCULAR | Status: AC
  Filled 2011-02-16: qty 30

## 2011-02-16 MED ORDER — ACETAMINOPHEN 325 MG PO TABS: 650.0000 mg | ORAL_TABLET | ORAL | Status: DC | PRN

## 2011-02-16 MED ORDER — SODIUM CHLORIDE 0.9 % IJ SOLN: 3.0000 mL | INTRAMUSCULAR | Status: DC | PRN

## 2011-02-16 MED ORDER — VERAPAMIL HCL 2.5 MG/ML IV SOLN
INTRAVENOUS | Status: AC
  Filled 2011-02-16: qty 2

## 2011-02-16 MED ORDER — HEPARIN SODIUM (PORCINE) 1000 UNIT/ML IJ SOLN
INTRAMUSCULAR | Status: AC
  Filled 2011-02-16: qty 1

## 2011-02-16 MED ORDER — HEPARIN (PORCINE) IN NACL 2-0.9 UNIT/ML-% IJ SOLN
INTRAMUSCULAR | Status: AC
  Filled 2011-02-16: qty 2000

## 2011-02-16 MED ORDER — DIAZEPAM 5 MG PO TABS
5.0000 mg | ORAL_TABLET | ORAL | Status: AC
  Administered 2011-02-16: 5 mg via ORAL
  Filled 2011-02-16: qty 1

## 2011-02-16 MED ORDER — ASPIRIN 81 MG PO CHEW
324.0000 mg | CHEWABLE_TABLET | ORAL | Status: AC
  Administered 2011-02-16: 324 mg via ORAL
  Filled 2011-02-16: qty 4

## 2011-02-16 MED ORDER — NITROGLYCERIN 0.2 MG/ML ON CALL CATH LAB
INTRAVENOUS | Status: AC
  Filled 2011-02-16: qty 1

## 2011-02-16 MED ORDER — DEXTROSE 50 % IV SOLN
25.0000 mL | Freq: Once | INTRAVENOUS | Status: AC | PRN
  Administered 2011-02-16: 25 mL via INTRAVENOUS

## 2011-02-16 MED ORDER — DEXTROSE 50 % IV SOLN
INTRAVENOUS | Status: AC
  Filled 2011-02-16: qty 50

## 2011-02-16 NOTE — Interval H&P Note (Signed)
History and Physical Interval Note:   02/16/2011   8:59 AM   The patient is here today for a left heart catheterization with possible PCI. I have personally reviewed the labs and examined the patient. The History and Physical has been reviewed and there are no changes. The risks and benefits of the procedure have been reviewed with the patient. Informed consent has been signed by the patient and is present in the chart. The patient agrees to proceed with the procedure.   Delpha Perko  MD

## 2011-02-16 NOTE — Op Note (Signed)
Cardiac Catheterization Operative Report  Kathleen Norris [id number] 11/20/20129:27 AM No primary provider on file.  Procedure Performed:  1. Left Heart Catheterization 2. Selective Coronary Angiography 3. Left ventricular angiogram  Operator: Lauree Chandler, MD  Indication: Chest pain, abnormal stress test                                       Procedure Details: The risks, benefits, complications, treatment options, and expected outcomes were discussed with the patient. The patient and/or family concurred with the proposed plan, giving informed consent. The patient was brought to the cath lab after IV hydration was begun and oral premedication was given. The patient was further sedated with Versed and Fentanyl. The right wrist was prepped and draped in a sterile fashion after the Allens test was positive. Using the modified Seldinger access technique, a 5 French sheath was placed in the right radial artery. Standard diagnostic catheters were used to perform selective coronary angiography. A pigtail catheter was used to perform a left ventricular angiogram. There were no immediate complications. The sheath was removed in the cath lab and a Terumo hemostasis band was applied to there arteriotomy. The patient was taken to the recovery area in stable condition.   Coronary Interventions:  Hemodynamic Findings: Central aortic pressure:121/67 Left ventricular pressure:125/6/10  Angiographic Findings:  Left main: No disease.  Left Anterior Descending Artery: No disease.   Circumflex Artery: No disease.  Right Coronary Artery: No disease.  Left Ventricular Angiogram: LVEF=55-60%.   Impression: 1. No angiographic evidence of CAD 2. Normal LV systolic function.   Recommendations: No further cardiac workup.        Complications:  None; patient tolerated the procedure well.

## 2011-02-16 NOTE — Progress Notes (Signed)
CBG 35 at 0645. PIV started and 25 ml of D50 given via PIV. CBG 96 when rechecked per protocol.

## 2011-02-16 NOTE — H&P (View-Only) (Signed)
History of Present Illness: 45 yo WF with h/o DM, SVT s/p ablation per Dr. Lovena Le in 2005 who is here today for cardiac follow up. I saw her as a new patient one month ago for w/u of chest pain.  Kathleen Norris was hospitalized at [facility] in [place], [place] with diabetic ketoacidosis in September 2012.  Kathleen Norris had some chest pain during that admission and at her f/u appt in [place] Worthy Keeler, PA-C), Kathleen Norris asked for cardiology referral.  Kathleen Norris told  me that Kathleen Norris has daily chest pain. The pain seems to worsen with exertion. This is described as a pressure. No radiation. Her left arm goes numb at times. Occasional SOB. Kathleen Norris also has occasional palpitations. No prolonged runs of tachycardia. Kathleen Norris has been on a PPI in the past for possible GERD but no help with her chest pressure. I ordered an echo which was normal. Her stress myoview showed possible apical ischemia with normal LVEF and wall motion.  Kathleen Norris is here today for follow up and tells me that Kathleen Norris is still having chest pain. It is constant and never goes away. Slight worsening with exertion.    Past Medical History  Diagnosis Date  . Diabetes with ketoacidosis   . URI (upper respiratory infection)   . Sinusitis   . Chronic anxiety   . Chronic pain   . Diabetes mellitus     Diagnosed 1997  . SVT (supraventricular tachycardia)     Ablation per Dr. Lovena Le in November 2005    Past Surgical History  Procedure Date  . Tubal ligation     Current Outpatient Prescriptions  Medication Sig Dispense Refill  . ALPRAZolam (XANAX XR) 0.5 MG 24 hr tablet Take 0.5 mg by mouth every morning.        Marland Kitchen aspirin 81 MG tablet Take 81 mg by mouth daily.        . Blood Glucose Monitoring Suppl (PRODIGY AUTOCODE BLOOD GLUCOSE) DEVI by Does not apply route 3 (three) times daily.        Marland Kitchen glucose blood (PRODIGY AUTOCODE TEST) test strip 1 each by Other route 3 (three) times daily. Use as instructed       . insulin aspart (NOVOLOG) 100 UNIT/ML  injection Inject 4 Units into the skin 3 (three) times daily before meals. Sliding scale 4 units+       . insulin glargine (LANTUS) 100 UNIT/ML injection Inject 60 Units into the skin daily.       . NON FORMULARY Pt does take thyroid med but Kathleen Norris is out       . PRODIGY LANCETS 21G MISC by Does not apply route 3 (three) times daily.          No Known Allergies  History   Social History  . Marital Status: Married    Spouse Name: N/A    Number of Children: 4  . Years of Education: N/A   Occupational History  . Unemployed-Disability    Social History Main Topics  . Smoking status: Never Smoker   . Smokeless tobacco: Not on file  . Alcohol Use: No  . Drug Use: No  . Sexually Active:    Other Topics Concern  . Not on file   Social History Narrative  . No narrative on file    Family History  Problem Relation Age of Onset  . Heart disease Maternal Uncle     Review of Systems:  As stated in the HPI and otherwise negative.  BP 116/73  Pulse 80  Ht 5\' 8"  (1.727 m)  Wt 181 lb (82.101 kg)  BMI 27.52 kg/m2  Physical Examination: General: Well developed, well nourished, NAD HEENT: OP clear, mucus membranes moist SKIN: warm, dry. No rashes. Neuro: No focal deficits Musculoskeletal: Muscle strength 5/5 all ext Psychiatric: Mood and affect normal Neck: No JVD, no carotid bruits, no thyromegaly, no lymphadenopathy. Lungs:Clear bilaterally, no wheezes, rhonci, crackles Cardiovascular: Regular rate and rhythm. No murmurs, gallops or rubs. Abdomen:Soft. Bowel sounds present. Non-tender.  Extremities: No lower extremity edema. Pulses are 2 + in the bilateral DP/PT.  Echo: 02/01/11:  Left ventricle: The cavity size was normal. Wall thickness was normal. Systolic function was normal. The estimated ejection fraction was in the range of 55% to 60%. Wall motion was normal; there were no regional wall motion abnormalities. Left ventricular diastolic function parameters were  normal.   Stress myoview: 02/01/11: Stress Procedure: The patient exercised for 7 minutes and 30 seconds, RPE=15. The patient stopped due to DOE, Fatigue and Chest Pain at baseline 7.5/10 (chronic) with gradual increase at peak exercise to 8/10. There were nonspecific ST-T wave changes. There was a rare PAC. There was a decrease BP to 148/71 immediately post exercise, without dizziness. Technetium 61m Tetrofosmin was injected at peak exercise and myocardial perfusion imaging was performed after a brief delay.  Stress ECG: Significant ST abnormalities consistent with ischemia.  QPS  Raw Data Images: Acquisition technically good; normal left ventricular size.  Stress Images: There is decreased uptake in the distal anterior wall and apex.  Rest Images: There is decreased uptake in the distal anterior wall.  Subtraction (SDS): These findings are consistent with soft tissue attenuation; cannot exclude very mild apical ischemia.  Transient Ischemic Dilatation (Normal <1.22): 1.14  Lung/Heart Ratio (Normal <0.45): .26  Quantitative Gated Spect Images  QGS EDV: 92 ml  QGS ESV: 39 ml  QGS cine images: NL LV Function; NL Wall Motion  QGS EF: 57%  Impression  Exercise Capacity: Fair exercise capacity.  BP Response: Normal blood pressure response.  Clinical Symptoms: There is chest pain.  ECG Impression: Significant ST abnormalities consistent with ischemia.  Comparison with Prior Nuclear Study: No images to compare  Overall Impression: Low risk stress nuclear study with a small, partially reversible defect in the distal anterior wall and apex consistent with soft tissue attenuation; cannot exclude very mild apical ischemia.

## 2011-03-02 ENCOUNTER — Ambulatory Visit: Payer: Self-pay | Admitting: Cardiovascular Disease

## 2011-03-08 ENCOUNTER — Ambulatory Visit: Payer: PRIVATE HEALTH INSURANCE | Admitting: Cardiovascular Disease

## 2012-07-17 ENCOUNTER — Ambulatory Visit: Payer: Self-pay | Admitting: General Practice

## 2012-07-24 ENCOUNTER — Other Ambulatory Visit: Payer: Self-pay | Admitting: *Deleted

## 2012-07-24 DIAGNOSIS — R9439 Abnormal result of other cardiovascular function study: Secondary | ICD-10-CM

## 2012-07-24 MED ORDER — OMEPRAZOLE 40 MG PO CPDR: 40.0000 mg | DELAYED_RELEASE_CAPSULE | Freq: Every day | ORAL | Status: DC

## 2012-07-24 MED ORDER — ALPRAZOLAM 0.5 MG PO TABS: ORAL_TABLET | ORAL | Status: DC

## 2012-07-24 MED ORDER — EZETIMIBE 10 MG PO TABS: 10.0000 mg | ORAL_TABLET | Freq: Every day | ORAL | Status: DC

## 2012-07-24 NOTE — Telephone Encounter (Signed)
These prescriptions are okay

## 2012-07-24 NOTE — Telephone Encounter (Signed)
RXs called into the [facility] Pt notified

## 2012-07-24 NOTE — Telephone Encounter (Signed)
LAST REFILL 05/19/12. LAST OV 03/20/12. PLEASE CALL IN TO THE DRUG STORE. ROUTE TO YOUR NURSE

## 2012-07-25 ENCOUNTER — Ambulatory Visit (INDEPENDENT_AMBULATORY_CARE_PROVIDER_SITE_OTHER): Payer: Medicare Other | Admitting: Pharmacist Clinician (PhC)/ Clinical Pharmacy Specialist

## 2012-07-25 VITALS — BP 132/70 | HR 74 | Temp 98.7°F | Resp 18 | Ht 67.0 in | Wt 205.0 lb

## 2012-07-25 DIAGNOSIS — E109 Type 1 diabetes mellitus without complications: Secondary | ICD-10-CM

## 2012-07-25 NOTE — Progress Notes (Signed)
  Subjective:    Patient ID: Kathleen Norris, female    DOB: 1966-03-27, 47 y.o.   MRN: [id number]  HPI    Review of Systems     Objective:   Physical Exam        Assessment & Plan:

## 2012-07-25 NOTE — Progress Notes (Signed)
  Subjective:    Patient ID: Kathleen Norris, female    DOB: 1966-01-11, 47 y.o.   MRN: [id number]  HPI:  Long standing pain from diabetic neuropathy.  Patient uses an insulin pump with history of poor control.  She is following an excellent dietary plan and seems highly motivated to control her diabetes.    Review of Systems  Constitutional: Negative for appetite change.  Eyes: Negative.   Respiratory: Negative.   Cardiovascular: Negative.   Genitourinary: Negative.   Musculoskeletal: Positive for back pain.  Allergic/Immunologic: Negative.   Neurological: Positive for headaches.  Hematological: Negative.   Psychiatric/Behavioral: Negative.        Objective:   Physical Exam  Constitutional: She appears well-developed and well-nourished.  Cardiovascular: Normal rate and regular rhythm.   Skin: Skin is warm and dry.  Psychiatric: She has a normal mood and affect. Her behavior is normal. Judgment and thought content normal.          Assessment & Plan:   Subjective:    Kathleen Norris is a 47 y.o. female who presents for evaluation of pain. Records reviewed, and patient examined. Nature of the pain: neuropathc pain in feet and also back pain can stand for limited amounts of time Location of the pain: legs, feet and back Intensity: 8 out of 10 Exacerbating/relieving factors: diabetes Adverse effects of medications: cymbalta, lyrica, gabapentin, and ultram did not work and had adverse effects to them.  Review of Systems Pertinent items are noted in HPI.    Objective:  Radiographs: none    Assessment:    Assessment: Diabetic Neuropathy  Plan:     1.  Patient received a prescription signed by Dr. Laurance Flatten for hydrocodone 5-325mg  qd #30 until she can get in with Dr. Chalmers Cater for assessment. 2.  Patient referred for insulin pump and brittle diabetes to Dr. Chalmers Cater. 3.  Patient's current basal rate of 2.0 was continued with bolus.   4.  Diet reviewed with patient.  Memory Argue

## 2012-07-26 ENCOUNTER — Other Ambulatory Visit: Payer: Self-pay | Admitting: *Deleted

## 2012-07-26 MED ORDER — ALBUTEROL SULFATE HFA 108 (90 BASE) MCG/ACT IN AERS: 2.0000 | INHALATION_SPRAY | Freq: Four times a day (QID) | RESPIRATORY_TRACT | Status: DC | PRN

## 2012-08-04 ENCOUNTER — Ambulatory Visit: Payer: Medicare Other | Admitting: General Practice

## 2012-08-04 ENCOUNTER — Encounter: Payer: Self-pay | Admitting: *Deleted

## 2012-08-04 NOTE — Progress Notes (Signed)
Dr Dorian Heckle office (oral surg) called triage about this pt. She is currently there undergoing a tooth extraction now and pt uses a insulin pump. Pt was to be Npo and she turned her pump off before leaving home. After her travel to their office her BS went from 272 to 89- pt was sweating and weak so they cked bs. That office started and iv with 50 % dextrose and they state that they can bearly keep it above normal. They wanted her seen in our office today to get this under control before the weekend. i (triage) made her an appt for 145 today- then after consulting other nursing and nurse pract. We decided that it would be in her best interest to go over to the ER at [facility] immediately after this oral appt instead of coming here. Especially since the oral surg is in [place] as well.

## 2012-08-07 ENCOUNTER — Ambulatory Visit (INDEPENDENT_AMBULATORY_CARE_PROVIDER_SITE_OTHER): Payer: Medicare Other | Admitting: General Practice

## 2012-08-07 ENCOUNTER — Telehealth: Payer: Self-pay | Admitting: Nurse Practitioner

## 2012-08-07 ENCOUNTER — Encounter: Payer: Self-pay | Admitting: General Practice

## 2012-08-07 VITALS — BP 137/83 | HR 65 | Temp 98.2°F | Ht 67.0 in | Wt 206.5 lb

## 2012-08-07 DIAGNOSIS — T801XXA Vascular complications following infusion, transfusion and therapeutic injection, initial encounter: Secondary | ICD-10-CM

## 2012-08-07 DIAGNOSIS — I998 Other disorder of circulatory system: Secondary | ICD-10-CM

## 2012-08-07 NOTE — Telephone Encounter (Signed)
APPT MADE

## 2012-08-07 NOTE — Patient Instructions (Addendum)
IV Infiltration Sometimes fluid from your IV leaks under your skin. This is called an infiltration. HOME CARE  Put an ice pack on the puffy (swollen) area until the puffiness goes down. Place the ice pack on the swollen area 4 times a day for 10 minutes at a time.  Put ice in a plastic bag.  Place the towel between your skin and the bag.  Keep the swollen area above your chest as much as you can until the swelling goes down. Prop your arm up on at least 3 to 4 pillows.  Avoid tight fitting clothing, watches, or jewelry near the swollen area. GET HELP RIGHT AWAY IF:   The skin around the place where the needle was put in becomes darker and peels.  You have red streaks on your skin from the place where the needle was put in.  Yellowish white fluid (pus) comes out from the place where the needle was put in.  You have a temperature by mouth of 102 F (38.9 C). MAKE SURE YOU:  Understand these instructions.  Will watch your condition.  Will get help right away if you are not doing well or get worse. Document Released: 12/23/2007 Document Revised: 06/07/2011 Document Reviewed: 01/01/2009 [facility] Patient Information 2013 [place].

## 2012-08-07 NOTE — Progress Notes (Signed)
  Subjective:    Patient ID: Kathleen Norris, female    DOB: [birth year], 47 y.o.   MRN: [id number]  Hand Pain  The incident occurred 3 to 5 days ago. The incident occurred at home. Injury mechanism: iv placed on Friday morning. The pain is present in the right hand and right forearm. The quality of the pain is described as aching. The pain is at a severity of 10/10. The pain has been constant since the incident. Pertinent negatives include no chest pain, numbness or tingling. Nothing aggravates the symptoms. She has tried ice, elevation, heat and acetaminophen for the symptoms.      Review of Systems  Constitutional: Negative for fever and chills.  Respiratory: Negative for chest tightness and shortness of breath.   Cardiovascular: Negative for chest pain.  Musculoskeletal: Negative for myalgias and joint swelling.  Skin: Negative.   Neurological: Negative for tingling and numbness.       Objective:   Physical Exam  Constitutional: She is oriented to person, place, and time. She appears well-developed and well-nourished.  Cardiovascular: Normal rate, regular rhythm and normal heart sounds.   Pulmonary/Chest: Effort normal and breath sounds normal. No respiratory distress. She exhibits no tenderness.  Musculoskeletal: She exhibits edema and tenderness.  Tenderness upon palpation. Denies being able to make a fist. Able to rotate arm from elbow externally and internally. 2+ radial pulse noted bilaterally and brachial pulses. Non pitting 1+ edema to right hand/forearm. Negative leaking or weeping from affected extremity. Unable to determine catheter insertion site, healed well.   Neurological: She is alert and oriented to person, place, and time.  Skin: Skin is warm and dry.  Psychiatric: She has a normal mood and affect.          Assessment & Plan:  IV infiltration, initial encounter  keep site clean and dry Elevate an ice as instructed Watch for signs and symptoms of infection as  discussed RTO if symptoms worsen or visit nearest ER

## 2012-08-28 ENCOUNTER — Encounter: Payer: Self-pay | Admitting: Pharmacist

## 2012-08-28 ENCOUNTER — Telehealth: Payer: Self-pay | Admitting: Pharmacist

## 2012-08-28 ENCOUNTER — Ambulatory Visit (INDEPENDENT_AMBULATORY_CARE_PROVIDER_SITE_OTHER): Payer: Medicare Other | Admitting: Pharmacist

## 2012-08-28 VITALS — Ht 69.0 in | Wt 208.0 lb

## 2012-08-28 DIAGNOSIS — G629 Polyneuropathy, unspecified: Secondary | ICD-10-CM | POA: Insufficient documentation

## 2012-08-28 DIAGNOSIS — E039 Hypothyroidism, unspecified: Secondary | ICD-10-CM

## 2012-08-28 DIAGNOSIS — G589 Mononeuropathy, unspecified: Secondary | ICD-10-CM

## 2012-08-28 DIAGNOSIS — E1010 Type 1 diabetes mellitus with ketoacidosis without coma: Secondary | ICD-10-CM

## 2012-08-28 LAB — POCT GLYCOSYLATED HEMOGLOBIN (HGB A1C): Hemoglobin A1C: 10.5

## 2012-08-28 LAB — COMPLETE METABOLIC PANEL WITH GFR
ALT: 14 U/L (ref 0–35)
AST: 13 U/L (ref 0–37)
Albumin: 3.7 g/dL (ref 3.5–5.2)
BUN: 11 mg/dL (ref 6–23)
CO2: 26 mEq/L (ref 19–32)
Calcium: 8.8 mg/dL (ref 8.4–10.5)
Chloride: 104 mEq/L (ref 96–112)
GFR, Est African American: >89 mL/min
Potassium: 4.5 mEq/L (ref 3.5–5.3)

## 2012-08-28 LAB — THYROID PANEL WITH TSH
Free Thyroxine Index: 2 (ref 1.0–3.9)
TSH: 22.354 u[IU]/mL — ABNORMAL HIGH (ref 0.350–4.500)

## 2012-08-28 LAB — GLUCOSE, POCT (MANUAL RESULT ENTRY): POC Glucose: 189 mg/dl — AB (ref 70–99)

## 2012-08-28 NOTE — Progress Notes (Signed)
Diabetes Follow-Up Visit Chief Complaint:   Chief Complaint  Patient presents with  . Diabetes    Filed Weights   08/28/12 1300  Weight: 208 lb (94.348 kg)     HPI: Patient with history of type 1 diabetes diagnosed in her 20's.  She currently has a Medtronic Paradigm 723 insulin pump and uses Novolog insulin in pump.  She reports that she has not been checking blood glucose for the last 1 and 1/2 months because her glucometer is broken, but states she is expecting a replacement any day.   Download of [employer] report of insulin pump reveals only 3 recent BG readings recorded - 39, 79 and 175 .  She is changing infusion site about every 5 days.    Current pump settings: Basal rate 1.05 units/hour daily CHO ratio: 9.0 (patient states she commonly overrides recommendation because causes hypoglycemia) Insulin Sensitivity:  25 BG goal - 115 Active Insulin - 5 hrs  Patient was last seen by Memory Argue, clinical pharmacist in our office in April 2014.  At that visit patient was referred to Dr. Chalmers Cater but patient states she hasn't heard anything regarding that appt.  I checked with our referral dept and found out that Dr. Chalmers Cater does not accept pt's insurance carrier so she was referred to Dr. Dorris Fetch in [place]. His office has tried to contact patient but have been unsuccessful.  I gave patient Dr Liliane Channel office number to call for appt.   Also at last visit patient c/o neuropathy.  Per note she had tried but could not tolerate lyrica, cymbalta, gabapentin and ultram.  She has been taking hydrocodone/APAP 5/325mg  daily since that time with good relief and requests refill today.  Reviewed [place] Controlled Drug Database for patient for last year.    Exam Edema:  negatvie  Polyuria:  positive  Polydipsia:  positive Polyphagia:  negative  BMI:  Body mass index is 30.7 kg/(m^2).   Weight changes:  stable General Appearance:  alert, oriented, no acute distress Mood/Affect:  normal   Low  fat/carbohydrate diet?  Yes Nicotine Abuse?  No Medication Compliance?  Yes Exercise?  No Alcohol Abuse?  No  Home BG Monitoring:  Checking 0 times a day.     Lab Results  Component Value Date   HGBA1C 10.5 % 08/28/2012    No results found for this basename: MICROALBUR,  MALB24HUR    No results found for this basename: CHOL,  HDL,  LDLCALC,  LDLDIRECT,  TRIG,  CHOLHDL      Assessment: 1.  Diabetes.  Uncontrolled and non compliant - difficult to assess adequately due to not checking BG regularly 2.  Diabetic neuropathy  Recommendations: 1.  Changes to insulin pump setting include:  Basal rate - continue 1.05 u/hr  CHO ratio - increase to 12 - patient advised not to override but call office if experiencing hypoglycemia  Insulin Sensitivity:  50  BG goal - 120  Active Insulin - 5 hrs  2.  Reviewed HBG goals:  Fasting 80-130 and 1-2 hour post prandial <180.  Patient is instructed to check BG 3-4 times per day.   Patient is given Freestyle insulinx glucometer and #20 test strips to use until her new glucometer arrives 3.  Refer patient to Dr. Francesco Runner in [place] for neuropathy / chronic pain treatment. 4.  Return to clinic in 2 wks   Time spent counseling patient:  30 minutes

## 2012-08-28 NOTE — Telephone Encounter (Signed)
Left message that not going to refill medication we discussed at appt (hydrocodone/APAP) and that we have referred her to specialist (pain clinic- Dr. Francesco Runner)

## 2012-08-29 ENCOUNTER — Other Ambulatory Visit: Payer: Self-pay | Admitting: *Deleted

## 2012-08-29 LAB — NMR LIPOPROFILE WITH LIPIDS
Cholesterol, Total: 187 mg/dL (ref <200)
HDL Particle Number: 40.8 umol/L (ref >=30.5)
Large VLDL-P: <0.8 nmol/L (ref <=2.7)
Small LDL Particle Number: 656 nmol/L — ABNORMAL HIGH (ref <=527)

## 2012-08-29 NOTE — Telephone Encounter (Signed)
Pt was last seen by you on 08/07/12, last filled on 07/24/12. If approved [facility]

## 2012-08-31 ENCOUNTER — Telehealth: Payer: Self-pay | Admitting: Nurse Practitioner

## 2012-08-31 MED ORDER — LEVOTHYROXINE SODIUM 50 MCG PO TABS: 50.0000 ug | ORAL_TABLET | Freq: Every day | ORAL | Status: DC

## 2012-08-31 MED ORDER — ATORVASTATIN CALCIUM 40 MG PO TABS: 40.0000 mg | ORAL_TABLET | Freq: Every day | ORAL | Status: DC

## 2012-08-31 MED ORDER — ALPRAZOLAM 0.5 MG PO TABS: ORAL_TABLET | ORAL | Status: DC

## 2012-08-31 NOTE — Telephone Encounter (Signed)
OK 

## 2012-08-31 NOTE — Telephone Encounter (Signed)
Called patient with lab results . She states she needs levothyroxine, albuterol (should have refill - rx from 06/2012 with 2 rf) and alprazolam called in to pharmacy - the [facility] in [place]

## 2012-09-01 NOTE — Telephone Encounter (Signed)
All meds were sent to [facility]

## 2012-09-26 ENCOUNTER — Other Ambulatory Visit: Payer: Self-pay | Admitting: *Deleted

## 2012-09-26 MED ORDER — ALPRAZOLAM 0.5 MG PO TABS: ORAL_TABLET | ORAL | Status: DC

## 2012-09-26 NOTE — Telephone Encounter (Signed)
LAST RF 08/31/12. LAST OV 08/28/12. CALL IN DRUG STORE IF APPROVED

## 2012-09-26 NOTE — Telephone Encounter (Signed)
[facility] 

## 2012-09-26 NOTE — Telephone Encounter (Signed)
The patient to try to reduce the dose to one half tablet 2 or 3 times daily

## 2012-10-02 ENCOUNTER — Telehealth: Payer: Self-pay | Admitting: Family Medicine

## 2012-10-02 NOTE — Telephone Encounter (Signed)
I think she is calling about Pain referral Will send to referral dept to call patient with information.

## 2012-10-11 ENCOUNTER — Telehealth: Payer: Self-pay | Admitting: Nurse Practitioner

## 2012-10-11 NOTE — Telephone Encounter (Signed)
Patient had appt with [facility] but she states that she missed appt because she could not find office. She would like referral to somewhere else - specifically asks for [facility] Will send request to referrals and see what they can find out but I suggested that she try to stay with the current physician. Patient requested Rx for pain medication - I told her that she would have to get when she sees pain management.

## 2012-10-19 ENCOUNTER — Other Ambulatory Visit: Payer: Self-pay

## 2012-10-19 DIAGNOSIS — M549 Dorsalgia, unspecified: Secondary | ICD-10-CM

## 2012-11-01 ENCOUNTER — Telehealth: Payer: Self-pay | Admitting: Pharmacist

## 2012-11-01 NOTE — Telephone Encounter (Signed)
FYI

## 2012-11-01 NOTE — Telephone Encounter (Signed)
ok 

## 2012-11-01 NOTE — Telephone Encounter (Signed)
Per [name] in referral department - [facility] need more information regarding patient's pain and also requested xray before they can determine if they should see her.   Will make appt with PCP - Kathleen Norris for evaluation.

## 2012-11-02 ENCOUNTER — Other Ambulatory Visit: Payer: Self-pay | Admitting: *Deleted

## 2012-11-02 MED ORDER — ALPRAZOLAM 0.5 MG PO TABS: ORAL_TABLET | ORAL | Status: DC

## 2012-11-02 NOTE — Telephone Encounter (Signed)
Patient last seen in office on 08-28-12 with Tammy and 08-07-12 with [place]. Please advise. If approved please route to pool B so nurse can [facility]

## 2012-11-02 NOTE — Telephone Encounter (Signed)
Please call into pharmacy.thx

## 2012-11-02 NOTE — Telephone Encounter (Signed)
Please make this appt and notify patient. I am unable to access the majority of [place] appointment slots.  Thanks - Daimien Patmon

## 2012-11-03 NOTE — Telephone Encounter (Signed)
rx called into pharmacy

## 2012-11-03 NOTE — Telephone Encounter (Signed)
appt given for Monday Aug 11 with Ronnald Collum

## 2012-11-06 ENCOUNTER — Ambulatory Visit (INDEPENDENT_AMBULATORY_CARE_PROVIDER_SITE_OTHER): Payer: Medicare Other | Admitting: Nurse Practitioner

## 2012-11-06 ENCOUNTER — Ambulatory Visit: Payer: Medicare Other

## 2012-11-06 ENCOUNTER — Encounter: Payer: Self-pay | Admitting: Nurse Practitioner

## 2012-11-06 ENCOUNTER — Ambulatory Visit (INDEPENDENT_AMBULATORY_CARE_PROVIDER_SITE_OTHER): Payer: Medicare Other

## 2012-11-06 VITALS — BP 126/77 | HR 88 | Temp 98.3°F | Ht 68.0 in | Wt 205.5 lb

## 2012-11-06 DIAGNOSIS — M25559 Pain in unspecified hip: Secondary | ICD-10-CM

## 2012-11-06 DIAGNOSIS — M79609 Pain in unspecified limb: Secondary | ICD-10-CM

## 2012-11-06 DIAGNOSIS — M79671 Pain in right foot: Secondary | ICD-10-CM

## 2012-11-06 DIAGNOSIS — M7730 Calcaneal spur, unspecified foot: Secondary | ICD-10-CM

## 2012-11-06 DIAGNOSIS — G629 Polyneuropathy, unspecified: Secondary | ICD-10-CM

## 2012-11-06 DIAGNOSIS — G589 Mononeuropathy, unspecified: Secondary | ICD-10-CM

## 2012-11-06 DIAGNOSIS — M79673 Pain in unspecified foot: Secondary | ICD-10-CM

## 2012-11-06 DIAGNOSIS — G8929 Other chronic pain: Secondary | ICD-10-CM

## 2012-11-06 DIAGNOSIS — M7731 Calcaneal spur, right foot: Secondary | ICD-10-CM

## 2012-11-06 DIAGNOSIS — M25552 Pain in left hip: Secondary | ICD-10-CM

## 2012-11-06 MED ORDER — NAPROXEN 500 MG PO TABS: 500.0000 mg | ORAL_TABLET | Freq: Two times a day (BID) | ORAL | Status: DC

## 2012-11-06 NOTE — Progress Notes (Signed)
Subjective:    Patient ID: Kathleen Norris, female    DOB: 1965/06/12, 47 y.o.   MRN: [id number]  HPI Patient here today C/O left hip pain and Bil heel pain- Patient says they have been hurting for awhile- We have referral in for Pain management but appointment not made yet- She hasn't tried anything new.    Review of Systems  Musculoskeletal: Positive for gait problem (favoring left side). Negative for joint swelling.  All other systems reviewed and are negative.       Objective:   Physical Exam  Constitutional: She appears well-developed and well-nourished.  Cardiovascular: Normal rate and normal heart sounds.   Pulmonary/Chest: Effort normal and breath sounds normal.  Musculoskeletal:  FROM of left hip with pain on ext rotation and abduction. Bil heel pain on palpation.    Left hip- No acute findings-Preliminary reading by Ronnald Collum, FNP  [facility] Right heel- Right heel spur-Preliminary reading by Ronnald Collum, FNP  [facility] Lefft heel- Janeal Holmes, FNP       Assessment & Plan:   1. Heel pain, unspecified laterality   2. Hip pain, chronic, left   3. Heel pain, right   4. Heel spur, right    Orders Placed This Encounter  Procedures  . DG Foot Complete Left    Standing Status: Future     Number of Occurrences: 1     Standing Expiration Date: 01/06/2014    Order Specific Question:  Reason for Exam (SYMPTOM  OR DIAGNOSIS REQUIRED)    Answer:  pain    Order Specific Question:  Is the patient pregnant?    Answer:  No    Order Specific Question:  Preferred imaging location?    Answer:  Internal  . DG Hip Complete Left    Standing Status: Future     Number of Occurrences: 1     Standing Expiration Date: 01/06/2014    Order Specific Question:  Reason for Exam (SYMPTOM  OR DIAGNOSIS REQUIRED)    Answer:  pain    Order Specific Question:  Is the patient pregnant?    Answer:  No    Order Specific Question:  Preferred imaging location?    Answer:  Internal   . DG Foot Complete Right    Standing Status: Future     Number of Occurrences: 1     Standing Expiration Date: 01/06/2014    Order Specific Question:  Reason for Exam (SYMPTOM  OR DIAGNOSIS REQUIRED)    Answer:  pain    Order Specific Question:  Is the patient pregnant?    Answer:  No    Order Specific Question:  Preferred imaging location?    Answer:  Internal   Meds ordered this encounter  Medications  . levothyroxine (SYNTHROID, LEVOTHROID) 100 MCG tablet    Sig: Take 100 mcg by mouth daily before breakfast.  . sitaGLIPtin (JANUVIA) 100 MG tablet    Sig: Take 100 mg by mouth daily.  . metFORMIN (GLUCOPHAGE) 500 MG tablet    Sig: Take 500 mg by mouth 2 (two) times daily with a meal.  . naproxen (NAPROSYN) 500 MG tablet    Sig: Take 1 tablet (500 mg total) by mouth 2 (two) times daily with a meal.    Dispense:  60 tablet    Refill:  1    Order Specific Question:  Supervising Provider    Answer:  Chipper Herb [[id number]]   Elevate legs when sitting-  If hurts stop what doing  If worsens will make apppointment- ortho  Mary-Margaret Hassell Done, FNP

## 2012-11-06 NOTE — Patient Instructions (Signed)
Heel Spur A heel spur is a hook of bone that can form on the calcaneus (the heel bone and the largest bone of the foot). Heel spurs are often associated with plantar fasciitis and usually come in people who have had the problem for an extended period of time. The cause of the relationship is unknown. The pain associated with them is thought to be caused by an inflammation (soreness and redness) of the plantar fascia rather than the spur itself. The plantar fascia is a thick fibrous like tissue that runs from the calcaneus (heel bone) to the ball of the foot. This strong, tight tissue helps maintain the arch of your foot. It helps distribute the weight across your foot as you walk or run. Stresses placed on the plantar fascia can be tremendous. When it is inflamed normal activities become painful. Pain is worse in the morning after sleeping. After sleeping the plantar fascia is tight. The first movements stretch the fascia and this causes pain. As the tendon loosens, the pain usually gets better. It often returns with too much standing or walking.  About 70% of patients with plantar fasciitis have a heel spur. About half of people without foot pain also have heel spurs. DIAGNOSIS  The diagnosis of a heel spur is made by X-ray. The X-ray shows a hook of bone protruding from the bottom of the calcaneus at the point where the plantar fascia is attached to the heel bone.  TREATMENT  It is necessary to find out what is causing the stretching of the plantar fascia. If the cause is over-pronation (flat feet), orthotics and proper foot ware may help.  Stretching exercises, losing weight, wearing shoes that have a cushioned heel that absorbs shock, and elevating the heel with the use of a heel cradle, heel cup, or orthotics may all help. Heel cradles and heel cups provide extra comfort and cushion to the heel, and reduce the amount of shock to the sore area. AVOIDING THE PAIN OF PLANTAR FASCIITIS AND HEEL  SPURS  Consult a sports medicine professional before beginning a new exercise program.  Walking programs offer a good workout. There is a lower chance of overuse injuries common to the runners. There is less impact and less jarring of the joints.  Begin all new exercise programs slowly. If problems or pains develop, decrease the amount of time or distance until you are at a comfortable level.  Wear good shoes and replace them regularly.  Stretch your foot and the heel cords at the back of the ankle (Achilles tendons) both before and after exercise.  Run or exercise on even surfaces that are not hard. For example, asphalt is better than pavement.  Do not run barefoot on hard surfaces.  If using a treadmill, vary the incline.  Do not continue to workout if you have foot or joint problems. Seek professional help if they do not improve. HOME CARE INSTRUCTIONS   Avoid activities that cause you pain until you recover.  Use ice or cold packs to the problem or painful areas after working out.  Only take over-the-counter or prescription medicines for pain, discomfort, or fever as directed by your caregiver.  Soft shoe inserts or athletic shoes with air or gel sole cushions may be helpful.  If problems continue or become more severe, consult a sports medicine caregiver. Cortisone is a potent anti-inflammatory medication that may be injected into the painful area. You can discuss this treatment with your caregiver. MAKE SURE YOU:  Understand these instructions.  Will watch your condition.  Will get help right away if you are not doing well or get worse. Document Released: 04/21/2005 Document Revised: 06/07/2011 Document Reviewed: 06/23/2005 [facility] Patient Information 2014 [place].

## 2012-11-20 ENCOUNTER — Telehealth: Payer: Self-pay

## 2012-11-20 NOTE — Telephone Encounter (Signed)
Need to know why needs to switch very difficult to change pain management

## 2012-11-20 NOTE — Telephone Encounter (Signed)
Got a call from pain management and wants to be referred to another one

## 2012-11-21 NOTE — Telephone Encounter (Signed)
Going to be very difficult to change to another pain clinic- because the are going to want to know why you were dismissed from previous- Where were going before so i can try to get you in somewhere else

## 2012-11-21 NOTE — Telephone Encounter (Signed)
Patient states that an appt was made for her at her current pain clinic and she was unaware so she missed the appt. When she called to reschedule they told her she could not be seen there due to missed appt and she would have to find another clinic

## 2012-11-22 ENCOUNTER — Other Ambulatory Visit: Payer: Self-pay | Admitting: Nurse Practitioner

## 2012-11-22 DIAGNOSIS — E1140 Type 2 diabetes mellitus with diabetic neuropathy, unspecified: Secondary | ICD-10-CM

## 2012-11-22 NOTE — Telephone Encounter (Signed)
Was going to [facility] in [place]- needs a new refferal somewhere

## 2012-11-22 NOTE — Telephone Encounter (Signed)
Referral made 

## 2012-12-01 ENCOUNTER — Other Ambulatory Visit: Payer: Self-pay | Admitting: *Deleted

## 2012-12-01 MED ORDER — ALPRAZOLAM 0.5 MG PO TABS: ORAL_TABLET | ORAL | Status: DC

## 2012-12-01 NOTE — Telephone Encounter (Signed)
Script called in

## 2012-12-01 NOTE — Telephone Encounter (Signed)
Please call in xanax rx 0 refills

## 2012-12-01 NOTE — Telephone Encounter (Signed)
Last filled 11/03/12, last seen 10/27/12. Route back to pool B so they can call into Drug store

## 2012-12-15 ENCOUNTER — Ambulatory Visit: Payer: Medicare Other | Admitting: Nurse Practitioner

## 2012-12-21 ENCOUNTER — Ambulatory Visit (INDEPENDENT_AMBULATORY_CARE_PROVIDER_SITE_OTHER): Payer: Medicare Other | Admitting: General Practice

## 2012-12-21 ENCOUNTER — Encounter: Payer: Self-pay | Admitting: General Practice

## 2012-12-21 VITALS — BP 132/83 | HR 73 | Temp 98.7°F | Ht 68.0 in | Wt 202.0 lb

## 2012-12-21 DIAGNOSIS — M79671 Pain in right foot: Secondary | ICD-10-CM

## 2012-12-21 DIAGNOSIS — M7730 Calcaneal spur, unspecified foot: Secondary | ICD-10-CM

## 2012-12-21 DIAGNOSIS — M79609 Pain in unspecified limb: Secondary | ICD-10-CM

## 2012-12-21 DIAGNOSIS — M7731 Calcaneal spur, right foot: Secondary | ICD-10-CM

## 2012-12-21 DIAGNOSIS — M7732 Calcaneal spur, left foot: Secondary | ICD-10-CM

## 2012-12-21 DIAGNOSIS — Z23 Encounter for immunization: Secondary | ICD-10-CM

## 2012-12-21 DIAGNOSIS — M79672 Pain in left foot: Secondary | ICD-10-CM

## 2012-12-21 MED ORDER — PREDNISONE (PAK) 10 MG PO TABS: ORAL_TABLET | ORAL | Status: DC

## 2012-12-21 MED ORDER — HYDROCODONE-ACETAMINOPHEN 5-300 MG PO TABS: 1.0000 | ORAL_TABLET | Freq: Two times a day (BID) | ORAL | Status: DC | PRN

## 2012-12-21 NOTE — Progress Notes (Signed)
  Subjective:    Patient ID: Kathleen Norris, female    DOB: 03-Apr-1965, 47 y.o.   MRN: [id number]  HPI Patient presents today with complaints of heel spur pain worsening. She reports onset to be 6 months ago. Nalda was seen in this office on 11/06/12 and received naproxen. Reports no relief. She would like to be referred to ortho specialists. Reports changing shoes (custom made) due to neuropathy and still no relief with heel spurs.     Review of Systems  Constitutional: Negative for fever and chills.  Respiratory: Negative for chest tightness and shortness of breath.   Cardiovascular: Negative for chest pain and palpitations.  Musculoskeletal: Positive for back pain.       Chronic back and hip pain. Bilateral heel pain       Objective:   Physical Exam  Constitutional: She is oriented to person, place, and time. She appears well-developed and well-nourished.  Cardiovascular: Normal rate, regular rhythm and normal heart sounds.   Pulmonary/Chest: Effort normal and breath sounds normal. No respiratory distress. She exhibits no tenderness.  Musculoskeletal: She exhibits tenderness.  Tenderness to bilateral heel with palpation  Neurological: She is alert and oriented to person, place, and time.  Skin: Skin is warm and dry.  Psychiatric: She has a normal mood and affect.          Assessment & Plan:  1. Heel spur, right - Ambulatory referral to Orthopedic Surgery  2. Heel spur, left - Ambulatory referral to Orthopedic Surgery  3. Heel pain, left - Hydrocodone-Acetaminophen (VICODIN) 5-300 MG TABS; Take 1 tablet by mouth every 12 (twelve) hours as needed.  Dispense: 20 each; Refill: 0 - predniSONE (STERAPRED UNI-PAK) 10 MG tablet; Take as directed  Dispense: 21 tablet; Refill: 0  4. Heel pain, right - Hydrocodone-Acetaminophen (VICODIN) 5-300 MG TABS; Take 1 tablet by mouth every 12 (twelve) hours as needed.  Dispense: 20 each; Refill: 0 - predniSONE (STERAPRED UNI-PAK) 10 MG  tablet; Take as directed  Dispense: 21 tablet; Refill: 0 -Patient verbalized understanding -Erby Pian, FNP-C

## 2012-12-21 NOTE — Patient Instructions (Addendum)
Heel Spur A heel spur is a hook of bone that can form on the calcaneus (the heel bone and the largest bone of the foot). Heel spurs are often associated with plantar fasciitis and usually come in people who have had the problem for an extended period of time. The cause of the relationship is unknown. The pain associated with them is thought to be caused by an inflammation (soreness and redness) of the plantar fascia rather than the spur itself. The plantar fascia is a thick fibrous like tissue that runs from the calcaneus (heel bone) to the ball of the foot. This strong, tight tissue helps maintain the arch of your foot. It helps distribute the weight across your foot as you walk or run. Stresses placed on the plantar fascia can be tremendous. When it is inflamed normal activities become painful. Pain is worse in the morning after sleeping. After sleeping the plantar fascia is tight. The first movements stretch the fascia and this causes pain. As the tendon loosens, the pain usually gets better. It often returns with too much standing or walking.  About 70% of patients with plantar fasciitis have a heel spur. About half of people without foot pain also have heel spurs. DIAGNOSIS  The diagnosis of a heel spur is made by X-ray. The X-ray shows a hook of bone protruding from the bottom of the calcaneus at the point where the plantar fascia is attached to the heel bone.  TREATMENT  It is necessary to find out what is causing the stretching of the plantar fascia. If the cause is over-pronation (flat feet), orthotics and proper foot ware may help.  Stretching exercises, losing weight, wearing shoes that have a cushioned heel that absorbs shock, and elevating the heel with the use of a heel cradle, heel cup, or orthotics may all help. Heel cradles and heel cups provide extra comfort and cushion to the heel, and reduce the amount of shock to the sore area. AVOIDING THE PAIN OF PLANTAR FASCIITIS AND HEEL  SPURS  Consult a sports medicine professional before beginning a new exercise program.  Walking programs offer a good workout. There is a lower chance of overuse injuries common to the runners. There is less impact and less jarring of the joints.  Begin all new exercise programs slowly. If problems or pains develop, decrease the amount of time or distance until you are at a comfortable level.  Wear good shoes and replace them regularly.  Stretch your foot and the heel cords at the back of the ankle (Achilles tendons) both before and after exercise.  Run or exercise on even surfaces that are not hard. For example, asphalt is better than pavement.  Do not run barefoot on hard surfaces.  If using a treadmill, vary the incline.  Do not continue to workout if you have foot or joint problems. Seek professional help if they do not improve. HOME CARE INSTRUCTIONS   Avoid activities that cause you pain until you recover.  Use ice or cold packs to the problem or painful areas after working out.  Only take over-the-counter or prescription medicines for pain, discomfort, or fever as directed by your caregiver.  Soft shoe inserts or athletic shoes with air or gel sole cushions may be helpful.  If problems continue or become more severe, consult a sports medicine caregiver. Cortisone is a potent anti-inflammatory medication that may be injected into the painful area. You can discuss this treatment with your caregiver. MAKE SURE YOU:  Understand these instructions.  Will watch your condition.  Will get help right away if you are not doing well or get worse. Document Released: 04/21/2005 Document Revised: 06/07/2011 Document Reviewed: 06/23/2005 [facility] Patient Information 2014 [place].

## 2013-01-02 ENCOUNTER — Telehealth: Payer: Self-pay | Admitting: General Practice

## 2013-01-02 NOTE — Telephone Encounter (Signed)
Xanax last filled 12/01/12, vicodin last filled on visit 12/21/12 for #20, pain appt 02/12/13, cal we fill till then?

## 2013-01-04 ENCOUNTER — Other Ambulatory Visit: Payer: Self-pay | Admitting: General Practice

## 2013-01-04 ENCOUNTER — Other Ambulatory Visit: Payer: Self-pay

## 2013-01-04 MED ORDER — ALPRAZOLAM 0.5 MG PO TABS: ORAL_TABLET | ORAL | Status: DC

## 2013-01-04 NOTE — Telephone Encounter (Signed)
Last seen 12/11/12  Mae  If approved route to nurse to [facility]

## 2013-01-04 NOTE — Telephone Encounter (Signed)
Please call into pharmacy

## 2013-01-05 ENCOUNTER — Other Ambulatory Visit: Payer: Self-pay | Admitting: General Practice

## 2013-01-05 DIAGNOSIS — G8929 Other chronic pain: Secondary | ICD-10-CM

## 2013-01-05 NOTE — Telephone Encounter (Signed)
Rx done. 

## 2013-01-05 NOTE — Telephone Encounter (Signed)
Pt aware.

## 2013-01-05 NOTE — Telephone Encounter (Signed)
Referral to pain management clinic has been made. It seems like she was involved in a clinic in the past, so let's see if we can get her back in one and maintain compliance. Pain med not being refill by provider.

## 2013-01-30 ENCOUNTER — Other Ambulatory Visit: Payer: Self-pay

## 2013-01-30 MED ORDER — OMEPRAZOLE 40 MG PO CPDR: 40.0000 mg | DELAYED_RELEASE_CAPSULE | Freq: Every day | ORAL | Status: DC

## 2013-01-30 NOTE — Telephone Encounter (Signed)
Last sen 11/28/12 mae  Last glucose 08/28/12   This med not on EPIC

## 2013-01-31 ENCOUNTER — Other Ambulatory Visit: Payer: Self-pay

## 2013-01-31 DIAGNOSIS — G894 Chronic pain syndrome: Secondary | ICD-10-CM

## 2013-01-31 NOTE — Telephone Encounter (Signed)
Last seen 12/21/12  Mae  If approved route to nurse to [facility]

## 2013-02-01 ENCOUNTER — Other Ambulatory Visit: Payer: Self-pay | Admitting: General Practice

## 2013-02-01 MED ORDER — ALPRAZOLAM 0.5 MG PO TABS: ORAL_TABLET | ORAL | Status: DC

## 2013-02-01 NOTE — Telephone Encounter (Signed)
Please verify with pharmacy that patient has been receiving medication

## 2013-02-02 NOTE — Telephone Encounter (Signed)
Doctor neda prescribes this for the patient told the drug store to call them

## 2013-02-05 NOTE — Telephone Encounter (Signed)
Called in.

## 2013-02-27 ENCOUNTER — Encounter: Payer: Self-pay | Admitting: Family Medicine

## 2013-02-27 ENCOUNTER — Ambulatory Visit (INDEPENDENT_AMBULATORY_CARE_PROVIDER_SITE_OTHER): Payer: Medicare Other | Admitting: Family Medicine

## 2013-02-27 VITALS — BP 122/77 | HR 67 | Temp 97.5°F | Ht 68.0 in | Wt 197.0 lb

## 2013-02-27 DIAGNOSIS — E119 Type 2 diabetes mellitus without complications: Secondary | ICD-10-CM

## 2013-02-27 NOTE — Progress Notes (Signed)
   Subjective:    Patient ID: Kathleen Norris, female    DOB: 12/24/1965, 47 y.o.   MRN: [id number]  HPI This 47 y.o. female presents for evaluation of diabetes.  She has been Seeing endocrinology in [place] and is doing a lot better.  She needs Some paperwork filled out for the [facility].  She reports no hypoglycemia.  Review of Systems No chest pain, SOB, HA, dizziness, vision change, N/V, diarrhea, constipation, dysuria, urinary urgency or frequency, myalgias, arthralgias or rash.     Objective:   Physical Exam  Vital signs noted  Well developed well nourished female.  HEENT - Head atraumatic Normocephalic                Eyes - PERRLA, Conjuctiva - clear Sclera- Clear EOMI                Ears - EAC's Wnl TM's Wnl Gross Hearing WNL                Throat - oropharanx wnl Respiratory - Lungs CTA bilateral Cardiac - RRR S1 and S2 without murmur GI - Abdomen soft Nontender and bowel sounds active x 4 Extremities - No edema. Neuro - Grossly intact.      Assessment & Plan:  Diabetes - Continue current regimen with [facility] endocrinology.  Lysbeth Penner FNP

## 2013-02-27 NOTE — Patient Instructions (Signed)

## 2013-03-02 ENCOUNTER — Telehealth: Payer: Self-pay | Admitting: Family Medicine

## 2013-03-09 ENCOUNTER — Other Ambulatory Visit: Payer: Self-pay | Admitting: General Practice

## 2013-03-09 ENCOUNTER — Telehealth: Payer: Self-pay | Admitting: Family Medicine

## 2013-03-09 DIAGNOSIS — F411 Generalized anxiety disorder: Secondary | ICD-10-CM

## 2013-03-09 MED ORDER — ALPRAZOLAM 0.5 MG PO TABS: ORAL_TABLET | ORAL | Status: DC

## 2013-03-09 NOTE — Telephone Encounter (Signed)
Xanax script at front,pt aware.

## 2013-03-09 NOTE — Telephone Encounter (Signed)
Script ready for pick up 

## 2013-04-02 ENCOUNTER — Other Ambulatory Visit: Payer: Self-pay | Admitting: *Deleted

## 2013-04-02 MED ORDER — ATORVASTATIN CALCIUM 40 MG PO TABS: 40.0000 mg | ORAL_TABLET | Freq: Every day | ORAL | Status: DC

## 2013-04-02 MED ORDER — SITAGLIPTIN PHOSPHATE 100 MG PO TABS: 100.0000 mg | ORAL_TABLET | Freq: Every day | ORAL | Status: DC

## 2013-04-27 ENCOUNTER — Ambulatory Visit: Payer: Medicare Other | Admitting: Family Medicine

## 2013-04-30 ENCOUNTER — Ambulatory Visit (INDEPENDENT_AMBULATORY_CARE_PROVIDER_SITE_OTHER): Payer: Medicare Other | Admitting: Family Medicine

## 2013-04-30 ENCOUNTER — Encounter: Payer: Self-pay | Admitting: Family Medicine

## 2013-04-30 VITALS — BP 116/68 | HR 82 | Temp 99.1°F | Ht 68.0 in | Wt 191.0 lb

## 2013-04-30 DIAGNOSIS — E119 Type 2 diabetes mellitus without complications: Secondary | ICD-10-CM
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
## 2013-04-30 NOTE — Patient Instructions (Signed)

## 2013-05-04 ENCOUNTER — Other Ambulatory Visit: Payer: Self-pay | Admitting: *Deleted

## 2013-05-04 DIAGNOSIS — F411 Generalized anxiety disorder: Secondary | ICD-10-CM

## 2013-05-04 NOTE — Telephone Encounter (Signed)
Last seen 04/30/13, last filled 04/07/13. Route to nurse, call into Drug store

## 2013-05-07 MED ORDER — ALPRAZOLAM 0.5 MG PO TABS: ORAL_TABLET | ORAL | Status: DC

## 2013-05-07 NOTE — Telephone Encounter (Signed)
Called in.

## 2013-05-09 ENCOUNTER — Other Ambulatory Visit: Payer: Self-pay | Admitting: *Deleted

## 2013-05-09 MED ORDER — SITAGLIPTIN PHOSPHATE 100 MG PO TABS: 100.0000 mg | ORAL_TABLET | Freq: Every day | ORAL | Status: DC

## 2013-05-11 ENCOUNTER — Other Ambulatory Visit: Payer: Self-pay | Admitting: *Deleted

## 2013-05-11 MED ORDER — ATORVASTATIN CALCIUM 40 MG PO TABS: 40.0000 mg | ORAL_TABLET | Freq: Every day | ORAL | Status: DC

## 2013-05-17 ENCOUNTER — Telehealth: Payer: Self-pay | Admitting: Nurse Practitioner

## 2013-05-17 ENCOUNTER — Ambulatory Visit (INDEPENDENT_AMBULATORY_CARE_PROVIDER_SITE_OTHER): Payer: Medicare Other | Admitting: Nurse Practitioner

## 2013-05-17 ENCOUNTER — Encounter: Payer: Self-pay | Admitting: Nurse Practitioner

## 2013-05-17 VITALS — BP 155/88 | HR 123 | Temp 97.9°F | Ht 68.0 in | Wt 182.0 lb

## 2013-05-17 DIAGNOSIS — J209 Acute bronchitis, unspecified: Secondary | ICD-10-CM

## 2013-05-17 DIAGNOSIS — R6883 Chills (without fever): Secondary | ICD-10-CM

## 2013-05-17 LAB — POCT INFLUENZA A/B
INFLUENZA B, POC: NEGATIVE
Influenza A, POC: NEGATIVE

## 2013-05-17 MED ORDER — AZITHROMYCIN 250 MG PO TABS: ORAL_TABLET | ORAL | Status: DC

## 2013-05-17 MED ORDER — HYDROCODONE-HOMATROPINE 5-1.5 MG/5ML PO SYRP: 5.0000 mL | ORAL_SOLUTION | Freq: Three times a day (TID) | ORAL | Status: DC | PRN

## 2013-05-17 NOTE — Progress Notes (Signed)
   Subjective:    Patient ID: Kathleen Norris, female    DOB: 11/29/1965, 48 y.o.   MRN: [id number]  HPI  Patient presents today complaining of chills, cough, congestion, wheezing, sore throat, and headaches that began 2 weeks ago. Denies fever and ear pain. Also states she has been short of breath at times. Has tried OTC Nyquil with no relief. Patient states symptoms have gotten worse.  Review of Systems  Constitutional: Positive for chills and fatigue. Negative for fever.  HENT: Positive for congestion and sore throat. Negative for ear pain.   Respiratory: Positive for cough, shortness of breath and wheezing.   Cardiovascular: Negative for chest pain.  Neurological: Positive for headaches.  All other systems reviewed and are negative.       Objective:   Physical Exam  Constitutional: She is oriented to person, place, and time. She appears well-developed and well-nourished.  HENT:  Right Ear: External ear normal.  Left Ear: External ear normal.  Eyes: Pupils are equal, round, and reactive to light.  Neck: Normal range of motion. Neck supple.  Cardiovascular: Normal rate and regular rhythm.   Pulmonary/Chest: Effort normal and breath sounds normal.  Neurological: She is alert and oriented to person, place, and time.  Skin: Skin is warm and dry.    BP 155/88  Pulse 123  Temp(Src) 97.9 F (36.6 C) (Oral)  Ht 5\' 8"  (1.727 m)  Wt 182 lb (82.555 kg)  BMI 27.68 kg/m2  Results for orders placed in visit on 05/17/13  POCT INFLUENZA A/B      Result Value Ref Range   Influenza A, POC Negative     Influenza B, POC Negative          Assessment & Plan:   1. Chills   2. Acute bronchitis    Meds ordered this encounter  Medications  . azithromycin (ZITHROMAX) 250 MG tablet    Sig: As directed    Dispense:  6 tablet    Refill:  0    Order Specific Question:  Supervising Provider    Answer:  Chipper Herb [[id number]]  . HYDROcodone-homatropine (HYCODAN) 5-1.5 MG/5ML syrup   Sig: Take 5 mLs by mouth every 8 (eight) hours as needed for cough.    Dispense:  120 mL    Refill:  0    Order Specific Question:  Supervising Provider    Answer:  Chipper Herb [[id number]]   1. Take meds as prescribed 2. Use a cool mist humidifier especially during the winter months and when heat has been humid. 3. Use saline nose sprays frequently 4. Saline irrigations of the nose can be very helpful if done frequently.  * 4X daily for 1 week*  * Use of a nettie pot can be helpful with this. Follow directions with this* 5. Drink plenty of fluids 6. Keep thermostat turn down low 7.For any cough or congestion  Use plain Mucinex- regular strength or max strength is fine   * Children- consult with Pharmacist for dosing 8. For fever or aces or pains- take tylenol or ibuprofen appropriate for age and weight.  * for fevers greater than 101 orally you may alternate ibuprofen and tylenol every  3 hours.   Mary-Margaret Hassell Done, FNP

## 2013-05-17 NOTE — Telephone Encounter (Signed)
appt scheduled

## 2013-05-17 NOTE — Telephone Encounter (Signed)
Has to come pick up rx= printer hasn't been printing correctly but we have it now.

## 2013-05-17 NOTE — Patient Instructions (Signed)

## 2013-05-18 NOTE — Telephone Encounter (Signed)
Patient aware.

## 2013-05-22 ENCOUNTER — Encounter: Payer: Self-pay | Admitting: Nurse Practitioner

## 2013-05-22 ENCOUNTER — Ambulatory Visit (INDEPENDENT_AMBULATORY_CARE_PROVIDER_SITE_OTHER): Payer: Medicare Other | Admitting: Nurse Practitioner

## 2013-05-22 VITALS — BP 116/79 | HR 88 | Temp 97.5°F | Ht 68.0 in | Wt 183.0 lb

## 2013-05-22 DIAGNOSIS — M7730 Calcaneal spur, unspecified foot: Secondary | ICD-10-CM

## 2013-05-22 MED ORDER — TRIAMCINOLONE ACETONIDE 40 MG/ML IJ SUSP
60.0000 mg | Freq: Once | INTRAMUSCULAR | Status: AC
Start: 2013-05-22 — End: 2013-05-22
  Administered 2013-05-22: 60 mg via INTRAMUSCULAR

## 2013-05-22 MED ORDER — BUPIVACAINE HCL 0.25 % IJ SOLN
1.0000 mL | Freq: Once | INTRAMUSCULAR | Status: AC
  Administered 2013-05-22: 1 mL

## 2013-05-22 NOTE — Patient Instructions (Signed)
Heel Spur A heel spur is a hook of bone that can form on the calcaneus (the heel bone and the largest bone of the foot). Heel spurs are often associated with plantar fasciitis and usually come in people who have had the problem for an extended period of time. The cause of the relationship is unknown. The pain associated with them is thought to be caused by an inflammation (soreness and redness) of the plantar fascia rather than the spur itself. The plantar fascia is a thick fibrous like tissue that runs from the calcaneus (heel bone) to the ball of the foot. This strong, tight tissue helps maintain the arch of your foot. It helps distribute the weight across your foot as you walk or run. Stresses placed on the plantar fascia can be tremendous. When it is inflamed normal activities become painful. Pain is worse in the morning after sleeping. After sleeping the plantar fascia is tight. The first movements stretch the fascia and this causes pain. As the tendon loosens, the pain usually gets better. It often returns with too much standing or walking.  About 70% of patients with plantar fasciitis have a heel spur. About half of people without foot pain also have heel spurs. DIAGNOSIS  The diagnosis of a heel spur is made by X-ray. The X-ray shows a hook of bone protruding from the bottom of the calcaneus at the point where the plantar fascia is attached to the heel bone.  TREATMENT  It is necessary to find out what is causing the stretching of the plantar fascia. If the cause is over-pronation (flat feet), orthotics and proper foot ware may help.  Stretching exercises, losing weight, wearing shoes that have a cushioned heel that absorbs shock, and elevating the heel with the use of a heel cradle, heel cup, or orthotics may all help. Heel cradles and heel cups provide extra comfort and cushion to the heel, and reduce the amount of shock to the sore area. AVOIDING THE PAIN OF PLANTAR FASCIITIS AND HEEL  SPURS  Consult a sports medicine professional before beginning a new exercise program.  Walking programs offer a good workout. There is a lower chance of overuse injuries common to the runners. There is less impact and less jarring of the joints.  Begin all new exercise programs slowly. If problems or pains develop, decrease the amount of time or distance until you are at a comfortable level.  Wear good shoes and replace them regularly.  Stretch your foot and the heel cords at the back of the ankle (Achilles tendons) both before and after exercise.  Run or exercise on even surfaces that are not hard. For example, asphalt is better than pavement.  Do not run barefoot on hard surfaces.  If using a treadmill, vary the incline.  Do not continue to workout if you have foot or joint problems. Seek professional help if they do not improve. HOME CARE INSTRUCTIONS   Avoid activities that cause you pain until you recover.  Use ice or cold packs to the problem or painful areas after working out.  Only take over-the-counter or prescription medicines for pain, discomfort, or fever as directed by your caregiver.  Soft shoe inserts or athletic shoes with air or gel sole cushions may be helpful.  If problems continue or become more severe, consult a sports medicine caregiver. Cortisone is a potent anti-inflammatory medication that may be injected into the painful area. You can discuss this treatment with your caregiver. MAKE SURE YOU:  Understand these instructions.  Will watch your condition.  Will get help right away if you are not doing well or get worse. Document Released: 04/21/2005 Document Revised: 06/07/2011 Document Reviewed: 06/23/2005 [facility] Patient Information 2014 [place].

## 2013-05-22 NOTE — Progress Notes (Signed)
   Subjective:    Patient ID: Kathleen Norris, female    DOB: 1966-02-21, 48 y.o.   MRN: [id number]  HPI  Patient in today for injection of known bil heel spurs    Review of Systems  Constitutional: Negative.   HENT: Negative.   Respiratory: Negative.   Cardiovascular: Negative.   All other systems reviewed and are negative.       Objective:   Physical Exam  Constitutional: She appears well-developed and well-nourished.  Cardiovascular: Regular rhythm and normal heart sounds.   Pulmonary/Chest: Breath sounds normal.  Musculoskeletal:  Point tenderness bil heels  BP 116/79  Pulse 88  Temp(Src) 97.5 F (36.4 C) (Oral)  Ht 5\' 8"  (1.727 m)  Wt 183 lb (83.008 kg)  BMI 27.83 kg/m2   Bil heel injection- Betadine prep- Kenolog 40mg /ml with marcaine 0.25% 5ml- injected in each heel- Patient tolerated well      Assessment & Plan:   1. Heel spur    Meds ordered this encounter  Medications  . triamcinolone acetonide (KENALOG-40) injection 60 mg    Sig:   . triamcinolone acetonide (KENALOG-40) injection 60 mg    Sig:   . bupivacaine (MARCAINE) 0.25 % (with pres) injection 1 mL    Sig:   . bupivacaine (MARCAINE) 0.25 % (with pres) injection 1 mL    Sig:    Ice BID Stay off of heels today RTO prn  Mary-Margaret Hassell Done, FNP

## 2013-05-29 ENCOUNTER — Other Ambulatory Visit: Payer: Medicare Other | Admitting: Nurse Practitioner

## 2013-06-01 NOTE — Telephone Encounter (Signed)
PT WAS SEEN

## 2013-06-15 ENCOUNTER — Other Ambulatory Visit: Payer: Self-pay | Admitting: Family Medicine

## 2013-06-15 NOTE — Telephone Encounter (Signed)
Deny refill.Patient NTBS for follow up and lab work

## 2013-07-11 ENCOUNTER — Telehealth: Payer: Self-pay | Admitting: Nurse Practitioner

## 2013-07-11 ENCOUNTER — Telehealth: Payer: Self-pay | Admitting: Family Medicine

## 2013-07-11 DIAGNOSIS — F411 Generalized anxiety disorder: Secondary | ICD-10-CM

## 2013-07-12 MED ORDER — ALPRAZOLAM 0.5 MG PO TABS: ORAL_TABLET | ORAL | Status: DC

## 2013-07-12 MED ORDER — ALBUTEROL SULFATE HFA 108 (90 BASE) MCG/ACT IN AERS: 2.0000 | INHALATION_SPRAY | Freq: Four times a day (QID) | RESPIRATORY_TRACT | Status: AC | PRN

## 2013-07-12 MED ORDER — OMEPRAZOLE 40 MG PO CPDR: 40.0000 mg | DELAYED_RELEASE_CAPSULE | Freq: Every day | ORAL | Status: DC

## 2013-07-12 NOTE — Telephone Encounter (Signed)
Please call in xanax 0.5mg  1 po BID prn #60  with 0 refills

## 2013-07-13 NOTE — Telephone Encounter (Signed)
Pt aware, script done.

## 2013-07-13 NOTE — Telephone Encounter (Signed)
Phone authorization into [facility].

## 2013-08-07 ENCOUNTER — Other Ambulatory Visit: Payer: Self-pay | Admitting: Nurse Practitioner

## 2013-08-08 NOTE — Telephone Encounter (Signed)
Last seen 2/14 last refilled 07/13/13

## 2013-08-09 NOTE — Telephone Encounter (Signed)
Please call in xanax with 1 refillsxanax 

## 2013-08-09 NOTE — Telephone Encounter (Signed)
Called in.

## 2013-08-22 ENCOUNTER — Other Ambulatory Visit: Payer: Self-pay | Admitting: Family Medicine

## 2013-08-23 NOTE — Telephone Encounter (Signed)
Last seen 05/22/13  MMM  Last lipid 08/28/12

## 2013-09-05 ENCOUNTER — Other Ambulatory Visit: Payer: Self-pay | Admitting: Nurse Practitioner

## 2013-09-06 LAB — HEMOGLOBIN A1C: HEMOGLOBIN A1C: 10.3 % — AB (ref 4.0–6.0)

## 2013-09-06 NOTE — Telephone Encounter (Signed)
Last sen 05/22/13  MMM  If approved route to [facility]

## 2013-09-07 NOTE — Telephone Encounter (Signed)
Called in with one refill per mmm

## 2013-09-07 NOTE — Telephone Encounter (Signed)
Please call in xanax with 1 refills 

## 2013-10-11 ENCOUNTER — Telehealth: Payer: Self-pay | Admitting: Nurse Practitioner

## 2013-10-11 NOTE — Telephone Encounter (Signed)
Ok

## 2013-12-25 ENCOUNTER — Encounter: Payer: Self-pay | Admitting: *Deleted

## 2014-03-05 ENCOUNTER — Ambulatory Visit: Payer: Medicare Other | Admitting: Podiatry

## 2014-03-06 ENCOUNTER — Encounter (HOSPITAL_COMMUNITY): Payer: Self-pay | Admitting: Cardiovascular Disease

## 2014-04-10 DIAGNOSIS — I10 Essential (primary) hypertension: Secondary | ICD-10-CM | POA: Diagnosis not present

## 2014-04-10 DIAGNOSIS — Z0000 Encounter for general adult medical examination without abnormal findings: Secondary | ICD-10-CM | POA: Diagnosis not present

## 2014-04-10 DIAGNOSIS — E119 Type 2 diabetes mellitus without complications: Secondary | ICD-10-CM | POA: Diagnosis not present

## 2014-04-10 DIAGNOSIS — J4522 Mild intermittent asthma with status asthmaticus: Secondary | ICD-10-CM | POA: Diagnosis not present

## 2014-04-10 DIAGNOSIS — K210 Gastro-esophageal reflux disease with esophagitis: Secondary | ICD-10-CM | POA: Diagnosis not present

## 2014-04-10 DIAGNOSIS — E782 Mixed hyperlipidemia: Secondary | ICD-10-CM | POA: Diagnosis not present

## 2014-04-10 DIAGNOSIS — M722 Plantar fascial fibromatosis: Secondary | ICD-10-CM | POA: Diagnosis not present

## 2014-04-10 DIAGNOSIS — J301 Allergic rhinitis due to pollen: Secondary | ICD-10-CM | POA: Diagnosis not present

## 2014-04-10 DIAGNOSIS — E039 Hypothyroidism, unspecified: Secondary | ICD-10-CM | POA: Diagnosis not present

## 2014-04-19 DIAGNOSIS — J45909 Unspecified asthma, uncomplicated: Secondary | ICD-10-CM | POA: Diagnosis not present

## 2014-04-22 DIAGNOSIS — R6883 Chills (without fever): Secondary | ICD-10-CM | POA: Diagnosis not present

## 2014-04-22 DIAGNOSIS — R404 Transient alteration of awareness: Secondary | ICD-10-CM | POA: Diagnosis not present

## 2014-04-22 DIAGNOSIS — E11649 Type 2 diabetes mellitus with hypoglycemia without coma: Secondary | ICD-10-CM | POA: Diagnosis not present

## 2014-04-22 DIAGNOSIS — R079 Chest pain, unspecified: Secondary | ICD-10-CM | POA: Diagnosis not present

## 2014-04-22 DIAGNOSIS — E876 Hypokalemia: Secondary | ICD-10-CM | POA: Diagnosis not present

## 2014-04-22 DIAGNOSIS — M1990 Unspecified osteoarthritis, unspecified site: Secondary | ICD-10-CM | POA: Diagnosis not present

## 2014-04-22 DIAGNOSIS — Z794 Long term (current) use of insulin: Secondary | ICD-10-CM | POA: Diagnosis not present

## 2014-04-22 DIAGNOSIS — G43909 Migraine, unspecified, not intractable, without status migrainosus: Secondary | ICD-10-CM | POA: Diagnosis not present

## 2014-04-22 DIAGNOSIS — R531 Weakness: Secondary | ICD-10-CM | POA: Diagnosis not present

## 2014-04-22 DIAGNOSIS — G459 Transient cerebral ischemic attack, unspecified: Secondary | ICD-10-CM | POA: Diagnosis not present

## 2014-04-22 DIAGNOSIS — E039 Hypothyroidism, unspecified: Secondary | ICD-10-CM | POA: Diagnosis not present

## 2014-04-22 DIAGNOSIS — J45909 Unspecified asthma, uncomplicated: Secondary | ICD-10-CM | POA: Diagnosis not present

## 2014-04-22 DIAGNOSIS — R1110 Vomiting, unspecified: Secondary | ICD-10-CM | POA: Diagnosis not present

## 2014-04-22 DIAGNOSIS — J0100 Acute maxillary sinusitis, unspecified: Secondary | ICD-10-CM | POA: Diagnosis not present

## 2014-04-22 DIAGNOSIS — R51 Headache: Secondary | ICD-10-CM | POA: Diagnosis not present

## 2014-04-22 DIAGNOSIS — E1140 Type 2 diabetes mellitus with diabetic neuropathy, unspecified: Secondary | ICD-10-CM | POA: Diagnosis not present

## 2014-04-22 DIAGNOSIS — R4182 Altered mental status, unspecified: Secondary | ICD-10-CM | POA: Diagnosis not present

## 2014-04-23 DIAGNOSIS — J0100 Acute maxillary sinusitis, unspecified: Secondary | ICD-10-CM | POA: Diagnosis not present

## 2014-04-23 DIAGNOSIS — R1110 Vomiting, unspecified: Secondary | ICD-10-CM | POA: Diagnosis not present

## 2014-04-23 DIAGNOSIS — R531 Weakness: Secondary | ICD-10-CM | POA: Diagnosis not present

## 2014-04-23 DIAGNOSIS — R51 Headache: Secondary | ICD-10-CM | POA: Diagnosis not present

## 2014-04-23 DIAGNOSIS — Z794 Long term (current) use of insulin: Secondary | ICD-10-CM | POA: Diagnosis not present

## 2014-04-23 DIAGNOSIS — G459 Transient cerebral ischemic attack, unspecified: Secondary | ICD-10-CM | POA: Diagnosis not present

## 2014-04-23 DIAGNOSIS — I6523 Occlusion and stenosis of bilateral carotid arteries: Secondary | ICD-10-CM | POA: Diagnosis not present

## 2014-04-23 DIAGNOSIS — E876 Hypokalemia: Secondary | ICD-10-CM | POA: Diagnosis not present

## 2014-04-23 DIAGNOSIS — E039 Hypothyroidism, unspecified: Secondary | ICD-10-CM | POA: Diagnosis not present

## 2014-04-23 DIAGNOSIS — J45909 Unspecified asthma, uncomplicated: Secondary | ICD-10-CM | POA: Diagnosis not present

## 2014-04-23 DIAGNOSIS — R6883 Chills (without fever): Secondary | ICD-10-CM | POA: Diagnosis not present

## 2014-04-23 DIAGNOSIS — G43909 Migraine, unspecified, not intractable, without status migrainosus: Secondary | ICD-10-CM | POA: Diagnosis not present

## 2014-04-23 DIAGNOSIS — E1140 Type 2 diabetes mellitus with diabetic neuropathy, unspecified: Secondary | ICD-10-CM | POA: Diagnosis not present

## 2014-04-23 DIAGNOSIS — M1990 Unspecified osteoarthritis, unspecified site: Secondary | ICD-10-CM | POA: Diagnosis not present

## 2014-04-26 DIAGNOSIS — G43919 Migraine, unspecified, intractable, without status migrainosus: Secondary | ICD-10-CM | POA: Diagnosis not present

## 2014-05-01 DIAGNOSIS — R928 Other abnormal and inconclusive findings on diagnostic imaging of breast: Secondary | ICD-10-CM | POA: Diagnosis not present

## 2014-05-01 DIAGNOSIS — N63 Unspecified lump in breast: Secondary | ICD-10-CM | POA: Diagnosis not present

## 2014-05-20 DIAGNOSIS — J45909 Unspecified asthma, uncomplicated: Secondary | ICD-10-CM | POA: Diagnosis not present

## 2014-05-30 DIAGNOSIS — R531 Weakness: Secondary | ICD-10-CM | POA: Diagnosis not present

## 2014-05-30 DIAGNOSIS — G43009 Migraine without aura, not intractable, without status migrainosus: Secondary | ICD-10-CM | POA: Diagnosis not present

## 2014-05-30 DIAGNOSIS — M5481 Occipital neuralgia: Secondary | ICD-10-CM | POA: Diagnosis not present

## 2014-06-12 DIAGNOSIS — J4521 Mild intermittent asthma with (acute) exacerbation: Secondary | ICD-10-CM | POA: Diagnosis not present

## 2014-06-18 DIAGNOSIS — J45909 Unspecified asthma, uncomplicated: Secondary | ICD-10-CM | POA: Diagnosis not present

## 2014-06-19 DIAGNOSIS — E039 Hypothyroidism, unspecified: Secondary | ICD-10-CM | POA: Diagnosis not present

## 2014-06-19 DIAGNOSIS — E782 Mixed hyperlipidemia: Secondary | ICD-10-CM | POA: Diagnosis not present

## 2014-06-19 DIAGNOSIS — F410 Panic disorder [episodic paroxysmal anxiety] without agoraphobia: Secondary | ICD-10-CM | POA: Diagnosis not present

## 2014-06-19 DIAGNOSIS — E119 Type 2 diabetes mellitus without complications: Secondary | ICD-10-CM | POA: Diagnosis not present

## 2014-06-19 DIAGNOSIS — K210 Gastro-esophageal reflux disease with esophagitis: Secondary | ICD-10-CM | POA: Diagnosis not present

## 2014-07-08 DIAGNOSIS — J301 Allergic rhinitis due to pollen: Secondary | ICD-10-CM | POA: Diagnosis not present

## 2014-07-08 DIAGNOSIS — F410 Panic disorder [episodic paroxysmal anxiety] without agoraphobia: Secondary | ICD-10-CM | POA: Diagnosis not present

## 2014-07-08 DIAGNOSIS — J0101 Acute recurrent maxillary sinusitis: Secondary | ICD-10-CM | POA: Diagnosis not present

## 2014-07-08 DIAGNOSIS — E782 Mixed hyperlipidemia: Secondary | ICD-10-CM | POA: Diagnosis not present

## 2014-07-08 DIAGNOSIS — E039 Hypothyroidism, unspecified: Secondary | ICD-10-CM | POA: Diagnosis not present

## 2014-07-08 DIAGNOSIS — I10 Essential (primary) hypertension: Secondary | ICD-10-CM | POA: Diagnosis not present

## 2014-07-08 DIAGNOSIS — G43919 Migraine, unspecified, intractable, without status migrainosus: Secondary | ICD-10-CM | POA: Diagnosis not present

## 2014-07-08 DIAGNOSIS — K219 Gastro-esophageal reflux disease without esophagitis: Secondary | ICD-10-CM | POA: Diagnosis not present

## 2014-08-11 DIAGNOSIS — J45909 Unspecified asthma, uncomplicated: Secondary | ICD-10-CM | POA: Diagnosis not present

## 2014-08-30 DIAGNOSIS — J45909 Unspecified asthma, uncomplicated: Secondary | ICD-10-CM | POA: Diagnosis not present

## 2014-10-25 DIAGNOSIS — K219 Gastro-esophageal reflux disease without esophagitis: Secondary | ICD-10-CM | POA: Diagnosis not present

## 2014-10-25 DIAGNOSIS — F410 Panic disorder [episodic paroxysmal anxiety] without agoraphobia: Secondary | ICD-10-CM | POA: Diagnosis not present

## 2014-10-25 DIAGNOSIS — J301 Allergic rhinitis due to pollen: Secondary | ICD-10-CM | POA: Diagnosis not present

## 2014-10-25 DIAGNOSIS — E039 Hypothyroidism, unspecified: Secondary | ICD-10-CM | POA: Diagnosis not present

## 2014-10-25 DIAGNOSIS — I10 Essential (primary) hypertension: Secondary | ICD-10-CM | POA: Diagnosis not present

## 2014-10-25 DIAGNOSIS — E119 Type 2 diabetes mellitus without complications: Secondary | ICD-10-CM | POA: Diagnosis not present

## 2014-10-25 DIAGNOSIS — G43919 Migraine, unspecified, intractable, without status migrainosus: Secondary | ICD-10-CM | POA: Diagnosis not present

## 2014-10-25 DIAGNOSIS — E782 Mixed hyperlipidemia: Secondary | ICD-10-CM | POA: Diagnosis not present

## 2014-11-06 IMAGING — CR DG FOOT COMPLETE 3+V*L*
3 series · 3 of 3 positions shown · non-contrast
Comparison: None.

CLINICAL DATA: Bilateral foot pain without trauma.

LEFT FOOT - COMPLETE 3+ VIEW

[view not recorded (1 of 3)]
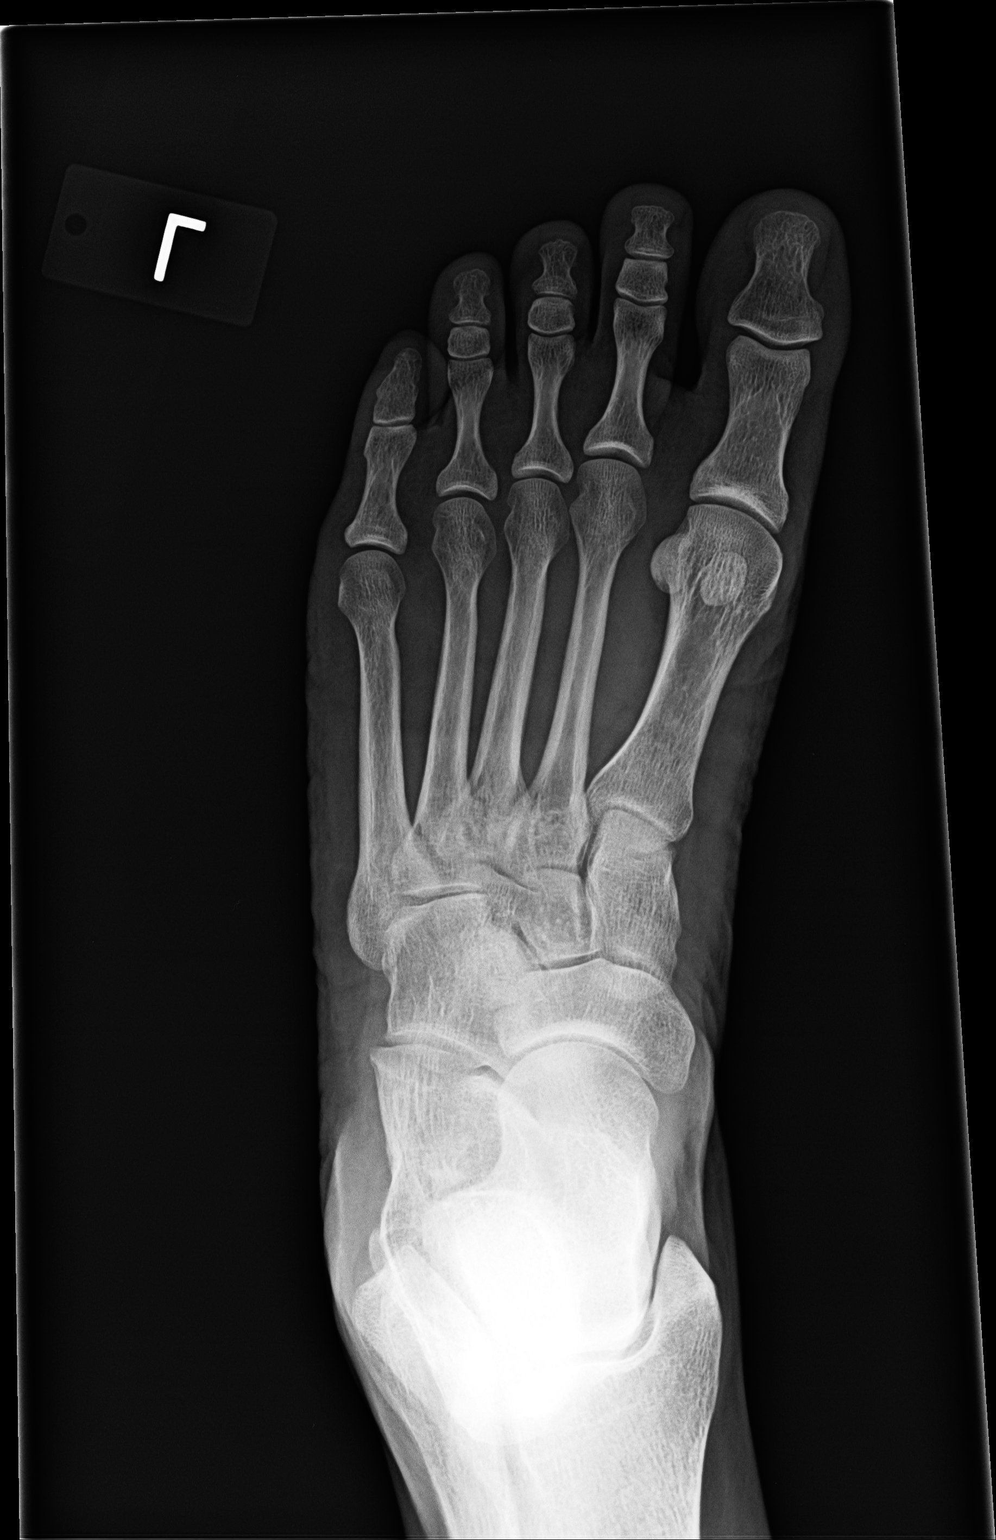

[view not recorded (2 of 3)]
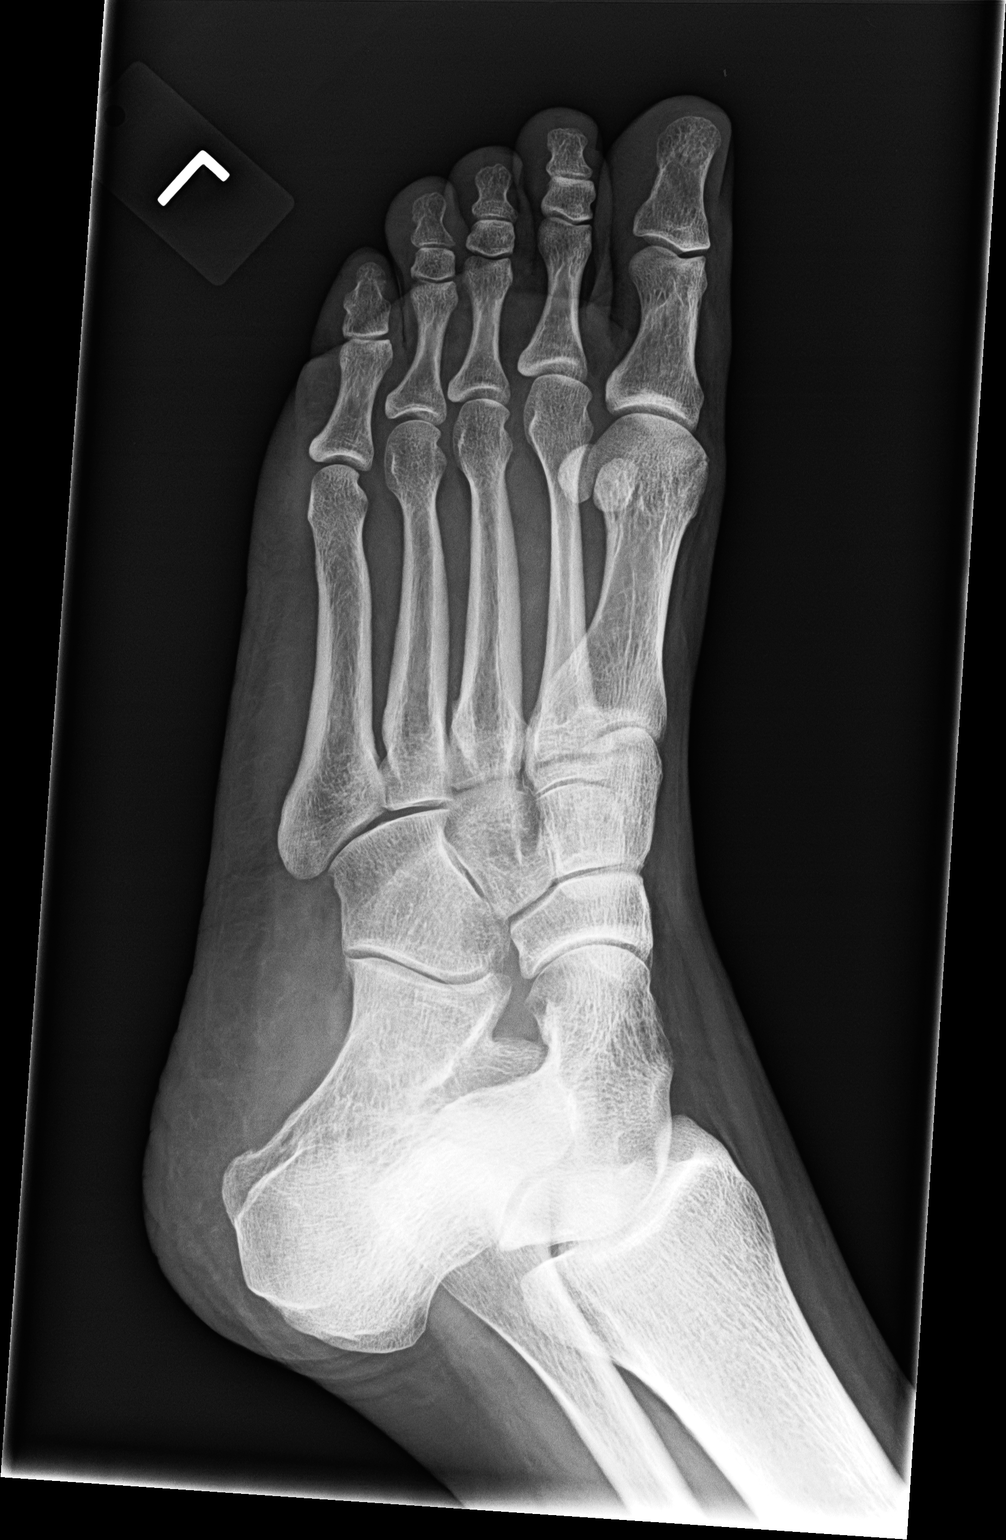

[view not recorded (3 of 3)]
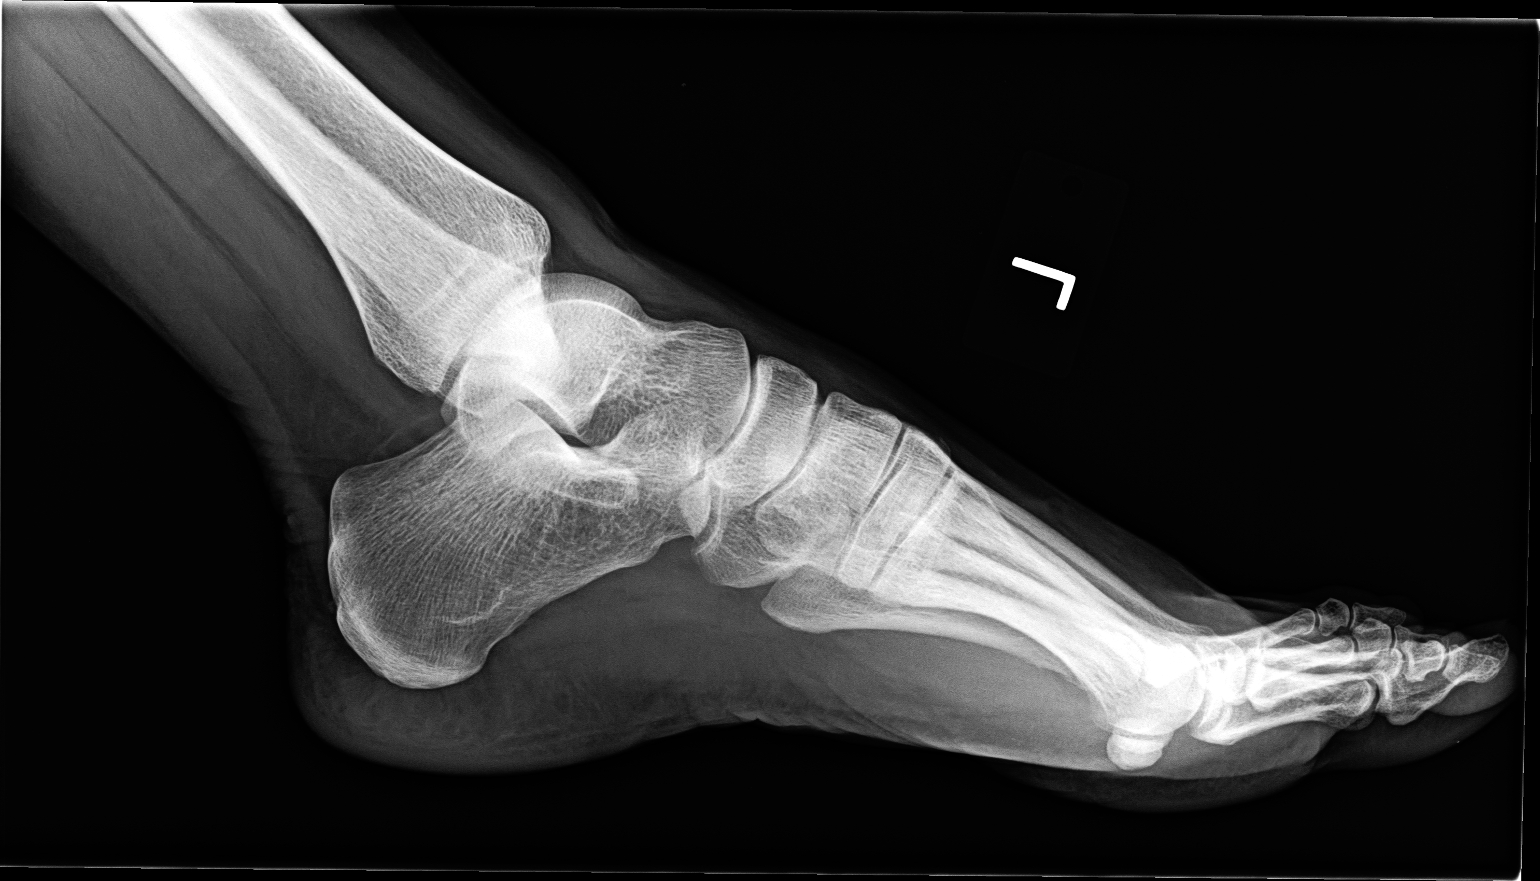

[3 of 3 positions shown; findings below may reference images not displayed]

FINDINGS: No acute fracture or dislocation.  Joint spaces
maintained.
IMPRESSION: No acute osseous abnormality.

Clinically significant discrepancy from primary report, if
provided: None

## 2014-12-27 DIAGNOSIS — Z23 Encounter for immunization: Secondary | ICD-10-CM | POA: Diagnosis not present

## 2015-02-13 ENCOUNTER — Encounter: Payer: Self-pay | Admitting: *Deleted

## 2015-02-25 DIAGNOSIS — R293 Abnormal posture: Secondary | ICD-10-CM | POA: Diagnosis not present

## 2015-02-25 DIAGNOSIS — M545 Low back pain: Secondary | ICD-10-CM | POA: Diagnosis not present

## 2015-02-25 DIAGNOSIS — M542 Cervicalgia: Secondary | ICD-10-CM | POA: Diagnosis not present

## 2015-02-26 DIAGNOSIS — N6489 Other specified disorders of breast: Secondary | ICD-10-CM | POA: Diagnosis not present

## 2015-02-26 DIAGNOSIS — R928 Other abnormal and inconclusive findings on diagnostic imaging of breast: Secondary | ICD-10-CM | POA: Diagnosis not present

## 2015-02-27 DIAGNOSIS — R293 Abnormal posture: Secondary | ICD-10-CM | POA: Diagnosis not present

## 2015-02-27 DIAGNOSIS — M542 Cervicalgia: Secondary | ICD-10-CM | POA: Diagnosis not present

## 2015-02-27 DIAGNOSIS — M545 Low back pain: Secondary | ICD-10-CM | POA: Diagnosis not present

## 2015-02-28 DIAGNOSIS — E782 Mixed hyperlipidemia: Secondary | ICD-10-CM | POA: Diagnosis not present

## 2015-02-28 DIAGNOSIS — E1165 Type 2 diabetes mellitus with hyperglycemia: Secondary | ICD-10-CM | POA: Diagnosis not present

## 2015-02-28 DIAGNOSIS — I10 Essential (primary) hypertension: Secondary | ICD-10-CM | POA: Diagnosis not present

## 2015-02-28 DIAGNOSIS — K219 Gastro-esophageal reflux disease without esophagitis: Secondary | ICD-10-CM | POA: Diagnosis not present

## 2015-02-28 DIAGNOSIS — E039 Hypothyroidism, unspecified: Secondary | ICD-10-CM | POA: Diagnosis not present

## 2015-02-28 DIAGNOSIS — F410 Panic disorder [episodic paroxysmal anxiety] without agoraphobia: Secondary | ICD-10-CM | POA: Diagnosis not present

## 2015-02-28 DIAGNOSIS — G43919 Migraine, unspecified, intractable, without status migrainosus: Secondary | ICD-10-CM | POA: Diagnosis not present

## 2015-04-18 DIAGNOSIS — L0292 Furuncle, unspecified: Secondary | ICD-10-CM | POA: Diagnosis not present

## 2015-04-18 DIAGNOSIS — N7689 Other specified inflammation of vagina and vulva: Secondary | ICD-10-CM | POA: Diagnosis not present

## 2015-04-19 DIAGNOSIS — E876 Hypokalemia: Secondary | ICD-10-CM | POA: Diagnosis not present

## 2015-04-19 DIAGNOSIS — K610 Anal abscess: Secondary | ICD-10-CM | POA: Diagnosis not present

## 2015-04-19 DIAGNOSIS — R079 Chest pain, unspecified: Secondary | ICD-10-CM | POA: Diagnosis not present

## 2015-04-19 DIAGNOSIS — E1310 Other specified diabetes mellitus with ketoacidosis without coma: Secondary | ICD-10-CM | POA: Diagnosis not present

## 2015-04-19 DIAGNOSIS — Z794 Long term (current) use of insulin: Secondary | ICD-10-CM | POA: Diagnosis not present

## 2015-04-19 DIAGNOSIS — L03315 Cellulitis of perineum: Secondary | ICD-10-CM | POA: Diagnosis not present

## 2015-04-19 DIAGNOSIS — E860 Dehydration: Secondary | ICD-10-CM | POA: Diagnosis not present

## 2015-04-19 DIAGNOSIS — E039 Hypothyroidism, unspecified: Secondary | ICD-10-CM | POA: Diagnosis not present

## 2015-04-19 DIAGNOSIS — A419 Sepsis, unspecified organism: Secondary | ICD-10-CM | POA: Diagnosis not present

## 2015-04-19 DIAGNOSIS — L02215 Cutaneous abscess of perineum: Secondary | ICD-10-CM | POA: Diagnosis not present

## 2015-04-19 DIAGNOSIS — E1010 Type 1 diabetes mellitus with ketoacidosis without coma: Secondary | ICD-10-CM | POA: Diagnosis not present

## 2015-04-19 DIAGNOSIS — E0810 Diabetes mellitus due to underlying condition with ketoacidosis without coma: Secondary | ICD-10-CM | POA: Diagnosis not present

## 2015-04-19 DIAGNOSIS — I252 Old myocardial infarction: Secondary | ICD-10-CM | POA: Diagnosis not present

## 2015-06-30 DIAGNOSIS — F410 Panic disorder [episodic paroxysmal anxiety] without agoraphobia: Secondary | ICD-10-CM | POA: Diagnosis not present

## 2015-06-30 DIAGNOSIS — I10 Essential (primary) hypertension: Secondary | ICD-10-CM | POA: Diagnosis not present

## 2015-06-30 DIAGNOSIS — J301 Allergic rhinitis due to pollen: Secondary | ICD-10-CM | POA: Diagnosis not present

## 2015-06-30 DIAGNOSIS — K219 Gastro-esophageal reflux disease without esophagitis: Secondary | ICD-10-CM | POA: Diagnosis not present

## 2015-06-30 DIAGNOSIS — J0100 Acute maxillary sinusitis, unspecified: Secondary | ICD-10-CM | POA: Diagnosis not present

## 2015-06-30 DIAGNOSIS — E039 Hypothyroidism, unspecified: Secondary | ICD-10-CM | POA: Diagnosis not present

## 2015-06-30 DIAGNOSIS — Z1389 Encounter for screening for other disorder: Secondary | ICD-10-CM | POA: Diagnosis not present

## 2015-06-30 DIAGNOSIS — Z9189 Other specified personal risk factors, not elsewhere classified: Secondary | ICD-10-CM | POA: Diagnosis not present

## 2015-06-30 DIAGNOSIS — E782 Mixed hyperlipidemia: Secondary | ICD-10-CM | POA: Diagnosis not present

## 2015-07-30 DIAGNOSIS — S161XXA Strain of muscle, fascia and tendon at neck level, initial encounter: Secondary | ICD-10-CM | POA: Diagnosis not present

## 2015-07-30 DIAGNOSIS — M542 Cervicalgia: Secondary | ICD-10-CM | POA: Diagnosis not present

## 2015-07-30 DIAGNOSIS — L0292 Furuncle, unspecified: Secondary | ICD-10-CM | POA: Diagnosis not present

## 2015-07-30 DIAGNOSIS — E1165 Type 2 diabetes mellitus with hyperglycemia: Secondary | ICD-10-CM | POA: Diagnosis not present

## 2015-08-21 DIAGNOSIS — L03317 Cellulitis of buttock: Secondary | ICD-10-CM | POA: Diagnosis not present

## 2015-08-22 DIAGNOSIS — E785 Hyperlipidemia, unspecified: Secondary | ICD-10-CM | POA: Diagnosis not present

## 2015-08-22 DIAGNOSIS — G43909 Migraine, unspecified, not intractable, without status migrainosus: Secondary | ICD-10-CM | POA: Diagnosis not present

## 2015-08-22 DIAGNOSIS — I2510 Atherosclerotic heart disease of native coronary artery without angina pectoris: Secondary | ICD-10-CM | POA: Diagnosis not present

## 2015-08-22 DIAGNOSIS — L02215 Cutaneous abscess of perineum: Secondary | ICD-10-CM | POA: Diagnosis not present

## 2015-08-22 DIAGNOSIS — K611 Rectal abscess: Secondary | ICD-10-CM | POA: Diagnosis not present

## 2015-08-22 DIAGNOSIS — E119 Type 2 diabetes mellitus without complications: Secondary | ICD-10-CM | POA: Diagnosis not present

## 2015-08-22 DIAGNOSIS — M19011 Primary osteoarthritis, right shoulder: Secondary | ICD-10-CM | POA: Diagnosis not present

## 2015-08-22 DIAGNOSIS — E7800 Pure hypercholesterolemia, unspecified: Secondary | ICD-10-CM | POA: Diagnosis not present

## 2015-08-22 DIAGNOSIS — E1042 Type 1 diabetes mellitus with diabetic polyneuropathy: Secondary | ICD-10-CM | POA: Diagnosis not present

## 2015-08-22 DIAGNOSIS — E039 Hypothyroidism, unspecified: Secondary | ICD-10-CM | POA: Diagnosis not present

## 2015-08-22 DIAGNOSIS — B9562 Methicillin resistant Staphylococcus aureus infection as the cause of diseases classified elsewhere: Secondary | ICD-10-CM | POA: Diagnosis not present

## 2015-08-22 DIAGNOSIS — E10628 Type 1 diabetes mellitus with other skin complications: Secondary | ICD-10-CM | POA: Diagnosis not present

## 2015-08-22 DIAGNOSIS — M19012 Primary osteoarthritis, left shoulder: Secondary | ICD-10-CM | POA: Diagnosis not present

## 2015-08-27 DIAGNOSIS — M19011 Primary osteoarthritis, right shoulder: Secondary | ICD-10-CM | POA: Diagnosis not present

## 2015-08-27 DIAGNOSIS — G629 Polyneuropathy, unspecified: Secondary | ICD-10-CM | POA: Diagnosis not present

## 2015-08-27 DIAGNOSIS — E109 Type 1 diabetes mellitus without complications: Secondary | ICD-10-CM | POA: Diagnosis not present

## 2015-08-27 DIAGNOSIS — K611 Rectal abscess: Secondary | ICD-10-CM | POA: Diagnosis not present

## 2015-08-27 DIAGNOSIS — E039 Hypothyroidism, unspecified: Secondary | ICD-10-CM | POA: Diagnosis not present

## 2015-08-27 DIAGNOSIS — F419 Anxiety disorder, unspecified: Secondary | ICD-10-CM | POA: Diagnosis not present

## 2015-08-27 DIAGNOSIS — Z4800 Encounter for change or removal of nonsurgical wound dressing: Secondary | ICD-10-CM | POA: Diagnosis not present

## 2015-08-27 DIAGNOSIS — M19012 Primary osteoarthritis, left shoulder: Secondary | ICD-10-CM | POA: Diagnosis not present

## 2015-08-29 DIAGNOSIS — L03317 Cellulitis of buttock: Secondary | ICD-10-CM | POA: Diagnosis not present

## 2015-09-02 DIAGNOSIS — M19012 Primary osteoarthritis, left shoulder: Secondary | ICD-10-CM | POA: Diagnosis not present

## 2015-09-02 DIAGNOSIS — Z4800 Encounter for change or removal of nonsurgical wound dressing: Secondary | ICD-10-CM | POA: Diagnosis not present

## 2015-09-02 DIAGNOSIS — E039 Hypothyroidism, unspecified: Secondary | ICD-10-CM | POA: Diagnosis not present

## 2015-09-02 DIAGNOSIS — G629 Polyneuropathy, unspecified: Secondary | ICD-10-CM | POA: Diagnosis not present

## 2015-09-02 DIAGNOSIS — K611 Rectal abscess: Secondary | ICD-10-CM | POA: Diagnosis not present

## 2015-09-02 DIAGNOSIS — M19011 Primary osteoarthritis, right shoulder: Secondary | ICD-10-CM | POA: Diagnosis not present

## 2015-09-02 DIAGNOSIS — E109 Type 1 diabetes mellitus without complications: Secondary | ICD-10-CM | POA: Diagnosis not present

## 2015-09-02 DIAGNOSIS — F419 Anxiety disorder, unspecified: Secondary | ICD-10-CM | POA: Diagnosis not present

## 2015-09-03 DIAGNOSIS — E039 Hypothyroidism, unspecified: Secondary | ICD-10-CM | POA: Diagnosis not present

## 2015-09-03 DIAGNOSIS — E1165 Type 2 diabetes mellitus with hyperglycemia: Secondary | ICD-10-CM | POA: Diagnosis not present

## 2015-09-10 DIAGNOSIS — Z4800 Encounter for change or removal of nonsurgical wound dressing: Secondary | ICD-10-CM | POA: Diagnosis not present

## 2015-09-10 DIAGNOSIS — F419 Anxiety disorder, unspecified: Secondary | ICD-10-CM | POA: Diagnosis not present

## 2015-09-10 DIAGNOSIS — E109 Type 1 diabetes mellitus without complications: Secondary | ICD-10-CM | POA: Diagnosis not present

## 2015-09-10 DIAGNOSIS — G629 Polyneuropathy, unspecified: Secondary | ICD-10-CM | POA: Diagnosis not present

## 2015-09-10 DIAGNOSIS — E039 Hypothyroidism, unspecified: Secondary | ICD-10-CM | POA: Diagnosis not present

## 2015-09-10 DIAGNOSIS — M19012 Primary osteoarthritis, left shoulder: Secondary | ICD-10-CM | POA: Diagnosis not present

## 2015-09-10 DIAGNOSIS — K611 Rectal abscess: Secondary | ICD-10-CM | POA: Diagnosis not present

## 2015-09-10 DIAGNOSIS — M19011 Primary osteoarthritis, right shoulder: Secondary | ICD-10-CM | POA: Diagnosis not present

## 2015-09-16 ENCOUNTER — Other Ambulatory Visit: Payer: Self-pay | Admitting: Nurse Practitioner

## 2015-09-16 DIAGNOSIS — K611 Rectal abscess: Secondary | ICD-10-CM | POA: Diagnosis not present

## 2015-09-20 DIAGNOSIS — M542 Cervicalgia: Secondary | ICD-10-CM | POA: Diagnosis not present

## 2015-09-25 DIAGNOSIS — E1165 Type 2 diabetes mellitus with hyperglycemia: Secondary | ICD-10-CM | POA: Diagnosis not present

## 2015-09-25 DIAGNOSIS — E039 Hypothyroidism, unspecified: Secondary | ICD-10-CM | POA: Diagnosis not present

## 2015-09-27 DIAGNOSIS — G43909 Migraine, unspecified, not intractable, without status migrainosus: Secondary | ICD-10-CM | POA: Diagnosis not present

## 2015-09-27 DIAGNOSIS — E10649 Type 1 diabetes mellitus with hypoglycemia without coma: Secondary | ICD-10-CM | POA: Diagnosis not present

## 2015-09-27 DIAGNOSIS — Z7984 Long term (current) use of oral hypoglycemic drugs: Secondary | ICD-10-CM | POA: Diagnosis not present

## 2015-09-27 DIAGNOSIS — W01198A Fall on same level from slipping, tripping and stumbling with subsequent striking against other object, initial encounter: Secondary | ICD-10-CM | POA: Diagnosis not present

## 2015-09-27 DIAGNOSIS — Z794 Long term (current) use of insulin: Secondary | ICD-10-CM | POA: Diagnosis not present

## 2015-09-27 DIAGNOSIS — F339 Major depressive disorder, recurrent, unspecified: Secondary | ICD-10-CM | POA: Diagnosis not present

## 2015-09-27 DIAGNOSIS — R0781 Pleurodynia: Secondary | ICD-10-CM | POA: Diagnosis not present

## 2015-09-27 DIAGNOSIS — E1165 Type 2 diabetes mellitus with hyperglycemia: Secondary | ICD-10-CM | POA: Diagnosis not present

## 2015-09-27 DIAGNOSIS — E876 Hypokalemia: Secondary | ICD-10-CM | POA: Diagnosis not present

## 2015-09-27 DIAGNOSIS — F419 Anxiety disorder, unspecified: Secondary | ICD-10-CM | POA: Diagnosis not present

## 2015-09-27 DIAGNOSIS — E871 Hypo-osmolality and hyponatremia: Secondary | ICD-10-CM | POA: Diagnosis not present

## 2015-09-27 DIAGNOSIS — E785 Hyperlipidemia, unspecified: Secondary | ICD-10-CM | POA: Diagnosis not present

## 2015-09-27 DIAGNOSIS — S20212A Contusion of left front wall of thorax, initial encounter: Secondary | ICD-10-CM | POA: Diagnosis not present

## 2015-09-29 DIAGNOSIS — E160 Drug-induced hypoglycemia without coma: Secondary | ICD-10-CM | POA: Diagnosis not present

## 2015-10-13 DIAGNOSIS — E1165 Type 2 diabetes mellitus with hyperglycemia: Secondary | ICD-10-CM | POA: Diagnosis not present

## 2015-10-13 DIAGNOSIS — K219 Gastro-esophageal reflux disease without esophagitis: Secondary | ICD-10-CM | POA: Diagnosis not present

## 2015-10-13 DIAGNOSIS — E782 Mixed hyperlipidemia: Secondary | ICD-10-CM | POA: Diagnosis not present

## 2015-10-13 DIAGNOSIS — E039 Hypothyroidism, unspecified: Secondary | ICD-10-CM | POA: Diagnosis not present

## 2015-10-13 DIAGNOSIS — I10 Essential (primary) hypertension: Secondary | ICD-10-CM | POA: Diagnosis not present

## 2015-10-15 DIAGNOSIS — Z6825 Body mass index (BMI) 25.0-25.9, adult: Secondary | ICD-10-CM | POA: Diagnosis not present

## 2015-10-15 DIAGNOSIS — F410 Panic disorder [episodic paroxysmal anxiety] without agoraphobia: Secondary | ICD-10-CM | POA: Diagnosis not present

## 2015-10-15 DIAGNOSIS — Z9189 Other specified personal risk factors, not elsewhere classified: Secondary | ICD-10-CM | POA: Diagnosis not present

## 2015-10-15 DIAGNOSIS — J301 Allergic rhinitis due to pollen: Secondary | ICD-10-CM | POA: Diagnosis not present

## 2015-10-15 DIAGNOSIS — E782 Mixed hyperlipidemia: Secondary | ICD-10-CM | POA: Diagnosis not present

## 2015-10-15 DIAGNOSIS — I10 Essential (primary) hypertension: Secondary | ICD-10-CM | POA: Diagnosis not present

## 2015-10-15 DIAGNOSIS — K219 Gastro-esophageal reflux disease without esophagitis: Secondary | ICD-10-CM | POA: Diagnosis not present

## 2015-10-15 DIAGNOSIS — E039 Hypothyroidism, unspecified: Secondary | ICD-10-CM | POA: Diagnosis not present

## 2015-12-24 DIAGNOSIS — J0101 Acute recurrent maxillary sinusitis: Secondary | ICD-10-CM | POA: Diagnosis not present

## 2015-12-24 DIAGNOSIS — J209 Acute bronchitis, unspecified: Secondary | ICD-10-CM | POA: Diagnosis not present

## 2015-12-24 DIAGNOSIS — Z6827 Body mass index (BMI) 27.0-27.9, adult: Secondary | ICD-10-CM | POA: Diagnosis not present

## 2015-12-26 DIAGNOSIS — J209 Acute bronchitis, unspecified: Secondary | ICD-10-CM | POA: Diagnosis not present

## 2015-12-26 DIAGNOSIS — Z23 Encounter for immunization: Secondary | ICD-10-CM | POA: Diagnosis not present

## 2015-12-26 DIAGNOSIS — J0101 Acute recurrent maxillary sinusitis: Secondary | ICD-10-CM | POA: Diagnosis not present

## 2015-12-26 DIAGNOSIS — T363X5A Adverse effect of macrolides, initial encounter: Secondary | ICD-10-CM | POA: Diagnosis not present

## 2015-12-26 DIAGNOSIS — Z6826 Body mass index (BMI) 26.0-26.9, adult: Secondary | ICD-10-CM | POA: Diagnosis not present

## 2016-01-16 DIAGNOSIS — I10 Essential (primary) hypertension: Secondary | ICD-10-CM | POA: Diagnosis not present

## 2016-01-16 DIAGNOSIS — E1010 Type 1 diabetes mellitus with ketoacidosis without coma: Secondary | ICD-10-CM | POA: Diagnosis not present

## 2016-01-16 DIAGNOSIS — E782 Mixed hyperlipidemia: Secondary | ICD-10-CM | POA: Diagnosis not present

## 2016-01-16 DIAGNOSIS — E039 Hypothyroidism, unspecified: Secondary | ICD-10-CM | POA: Diagnosis not present

## 2016-01-16 DIAGNOSIS — E1065 Type 1 diabetes mellitus with hyperglycemia: Secondary | ICD-10-CM | POA: Diagnosis not present

## 2016-01-21 DIAGNOSIS — E1065 Type 1 diabetes mellitus with hyperglycemia: Secondary | ICD-10-CM | POA: Diagnosis not present

## 2016-01-21 DIAGNOSIS — F410 Panic disorder [episodic paroxysmal anxiety] without agoraphobia: Secondary | ICD-10-CM | POA: Diagnosis not present

## 2016-01-21 DIAGNOSIS — E782 Mixed hyperlipidemia: Secondary | ICD-10-CM | POA: Diagnosis not present

## 2016-01-21 DIAGNOSIS — I10 Essential (primary) hypertension: Secondary | ICD-10-CM | POA: Diagnosis not present

## 2016-01-21 DIAGNOSIS — G43919 Migraine, unspecified, intractable, without status migrainosus: Secondary | ICD-10-CM | POA: Diagnosis not present

## 2016-01-21 DIAGNOSIS — K219 Gastro-esophageal reflux disease without esophagitis: Secondary | ICD-10-CM | POA: Diagnosis not present

## 2016-01-21 DIAGNOSIS — Z6826 Body mass index (BMI) 26.0-26.9, adult: Secondary | ICD-10-CM | POA: Diagnosis not present

## 2016-01-21 DIAGNOSIS — E039 Hypothyroidism, unspecified: Secondary | ICD-10-CM | POA: Diagnosis not present

## 2016-02-23 DIAGNOSIS — L03317 Cellulitis of buttock: Secondary | ICD-10-CM | POA: Diagnosis not present

## 2016-02-23 DIAGNOSIS — Z6827 Body mass index (BMI) 27.0-27.9, adult: Secondary | ICD-10-CM | POA: Diagnosis not present

## 2016-02-24 DIAGNOSIS — L03317 Cellulitis of buttock: Secondary | ICD-10-CM | POA: Diagnosis not present

## 2016-03-19 DIAGNOSIS — B029 Zoster without complications: Secondary | ICD-10-CM | POA: Diagnosis not present

## 2016-03-19 DIAGNOSIS — L03314 Cellulitis of groin: Secondary | ICD-10-CM | POA: Diagnosis not present

## 2016-04-03 ENCOUNTER — Other Ambulatory Visit: Payer: Self-pay | Admitting: "Endocrinology

## 2016-05-04 DIAGNOSIS — I10 Essential (primary) hypertension: Secondary | ICD-10-CM | POA: Diagnosis not present

## 2016-05-04 DIAGNOSIS — E1065 Type 1 diabetes mellitus with hyperglycemia: Secondary | ICD-10-CM | POA: Diagnosis not present

## 2016-05-04 DIAGNOSIS — E039 Hypothyroidism, unspecified: Secondary | ICD-10-CM | POA: Diagnosis not present

## 2016-05-04 DIAGNOSIS — E782 Mixed hyperlipidemia: Secondary | ICD-10-CM | POA: Diagnosis not present

## 2016-05-14 DIAGNOSIS — Z79899 Other long term (current) drug therapy: Secondary | ICD-10-CM | POA: Diagnosis not present

## 2016-05-14 DIAGNOSIS — I2510 Atherosclerotic heart disease of native coronary artery without angina pectoris: Secondary | ICD-10-CM | POA: Diagnosis not present

## 2016-05-14 DIAGNOSIS — Z833 Family history of diabetes mellitus: Secondary | ICD-10-CM | POA: Diagnosis not present

## 2016-05-14 DIAGNOSIS — Z794 Long term (current) use of insulin: Secondary | ICD-10-CM | POA: Diagnosis not present

## 2016-05-14 DIAGNOSIS — E039 Hypothyroidism, unspecified: Secondary | ICD-10-CM | POA: Diagnosis not present

## 2016-05-14 DIAGNOSIS — E7800 Pure hypercholesterolemia, unspecified: Secondary | ICD-10-CM | POA: Diagnosis not present

## 2016-05-14 DIAGNOSIS — E109 Type 1 diabetes mellitus without complications: Secondary | ICD-10-CM | POA: Diagnosis not present

## 2016-05-14 DIAGNOSIS — R079 Chest pain, unspecified: Secondary | ICD-10-CM | POA: Diagnosis not present

## 2016-05-14 DIAGNOSIS — I252 Old myocardial infarction: Secondary | ICD-10-CM | POA: Diagnosis not present

## 2016-05-14 DIAGNOSIS — R112 Nausea with vomiting, unspecified: Secondary | ICD-10-CM | POA: Diagnosis not present

## 2016-05-14 DIAGNOSIS — R0602 Shortness of breath: Secondary | ICD-10-CM | POA: Diagnosis not present

## 2016-05-14 DIAGNOSIS — Z8249 Family history of ischemic heart disease and other diseases of the circulatory system: Secondary | ICD-10-CM | POA: Diagnosis not present

## 2016-05-15 DIAGNOSIS — M1990 Unspecified osteoarthritis, unspecified site: Secondary | ICD-10-CM | POA: Diagnosis not present

## 2016-05-15 DIAGNOSIS — Z8249 Family history of ischemic heart disease and other diseases of the circulatory system: Secondary | ICD-10-CM | POA: Diagnosis not present

## 2016-05-15 DIAGNOSIS — E1110 Type 2 diabetes mellitus with ketoacidosis without coma: Secondary | ICD-10-CM | POA: Diagnosis not present

## 2016-05-15 DIAGNOSIS — I252 Old myocardial infarction: Secondary | ICD-10-CM | POA: Diagnosis not present

## 2016-05-15 DIAGNOSIS — E871 Hypo-osmolality and hyponatremia: Secondary | ICD-10-CM | POA: Diagnosis not present

## 2016-05-15 DIAGNOSIS — Z794 Long term (current) use of insulin: Secondary | ICD-10-CM | POA: Diagnosis not present

## 2016-05-15 DIAGNOSIS — G43909 Migraine, unspecified, not intractable, without status migrainosus: Secondary | ICD-10-CM | POA: Diagnosis not present

## 2016-05-15 DIAGNOSIS — R61 Generalized hyperhidrosis: Secondary | ICD-10-CM | POA: Diagnosis not present

## 2016-05-15 DIAGNOSIS — K219 Gastro-esophageal reflux disease without esophagitis: Secondary | ICD-10-CM | POA: Diagnosis not present

## 2016-05-15 DIAGNOSIS — E1065 Type 1 diabetes mellitus with hyperglycemia: Secondary | ICD-10-CM | POA: Diagnosis not present

## 2016-05-15 DIAGNOSIS — E785 Hyperlipidemia, unspecified: Secondary | ICD-10-CM | POA: Diagnosis not present

## 2016-05-15 DIAGNOSIS — E039 Hypothyroidism, unspecified: Secondary | ICD-10-CM | POA: Diagnosis not present

## 2016-05-15 DIAGNOSIS — M79602 Pain in left arm: Secondary | ICD-10-CM | POA: Diagnosis not present

## 2016-05-15 DIAGNOSIS — R079 Chest pain, unspecified: Secondary | ICD-10-CM | POA: Diagnosis not present

## 2016-05-15 DIAGNOSIS — Z7982 Long term (current) use of aspirin: Secondary | ICD-10-CM | POA: Diagnosis not present

## 2016-05-15 DIAGNOSIS — E1142 Type 2 diabetes mellitus with diabetic polyneuropathy: Secondary | ICD-10-CM | POA: Diagnosis not present

## 2016-05-15 DIAGNOSIS — R0602 Shortness of breath: Secondary | ICD-10-CM | POA: Diagnosis not present

## 2016-05-15 DIAGNOSIS — Z833 Family history of diabetes mellitus: Secondary | ICD-10-CM | POA: Diagnosis not present

## 2016-05-15 DIAGNOSIS — Z881 Allergy status to other antibiotic agents status: Secondary | ICD-10-CM | POA: Diagnosis not present

## 2016-05-15 DIAGNOSIS — J45909 Unspecified asthma, uncomplicated: Secondary | ICD-10-CM | POA: Diagnosis not present

## 2016-05-15 DIAGNOSIS — R0789 Other chest pain: Secondary | ICD-10-CM | POA: Diagnosis not present

## 2016-05-15 DIAGNOSIS — Z79899 Other long term (current) drug therapy: Secondary | ICD-10-CM | POA: Diagnosis not present

## 2016-05-16 DIAGNOSIS — R071 Chest pain on breathing: Secondary | ICD-10-CM | POA: Diagnosis not present

## 2016-05-16 DIAGNOSIS — R079 Chest pain, unspecified: Secondary | ICD-10-CM | POA: Diagnosis not present

## 2016-05-24 DIAGNOSIS — J209 Acute bronchitis, unspecified: Secondary | ICD-10-CM | POA: Diagnosis not present

## 2016-05-25 DIAGNOSIS — R931 Abnormal findings on diagnostic imaging of heart and coronary circulation: Secondary | ICD-10-CM | POA: Diagnosis not present

## 2016-05-25 DIAGNOSIS — R079 Chest pain, unspecified: Secondary | ICD-10-CM | POA: Diagnosis not present

## 2016-05-26 DIAGNOSIS — J209 Acute bronchitis, unspecified: Secondary | ICD-10-CM | POA: Diagnosis not present

## 2016-05-26 DIAGNOSIS — R0602 Shortness of breath: Secondary | ICD-10-CM | POA: Diagnosis not present

## 2016-05-26 DIAGNOSIS — R05 Cough: Secondary | ICD-10-CM | POA: Diagnosis not present

## 2016-06-04 ENCOUNTER — Telehealth: Payer: Self-pay | Admitting: Cardiology

## 2016-06-04 NOTE — Telephone Encounter (Signed)
06/04/2016 Received faxed referral from [facility] for upcoming appointment with Dr. Martinique on 06/07/2016.  Records given to [facility].  cbr

## 2016-06-06 NOTE — Progress Notes (Deleted)
Cardiology Office Note    Date:  06/06/2016   ID:  MCKENZE SLONE, DOB [birth year], MRN [id number]  PCP:  No primary care provider on file.  Cardiologist:  Peter Martinique, MD    History of Present Illness:  Kathleen Norris is a 51 y.o. female seen at the request of Dr. Quillian Quince for evaluation of an abnormal stress test. She has a history of DM. She is s/p ablation for SVT in 2005 by Dr. Lovena Le. She was seen by Dr. Angelena Form in 2012 for atypical chest pain. An Echo was normal. Myoview study showed apical ischemia. Subsequent cardiac cath was normal.      Past Medical History:  Diagnosis Date  . Chronic anxiety   . Chronic pain   . Diabetes mellitus    Diagnosed 1997  . Diabetes with ketoacidosis   . Sinusitis   . SVT (supraventricular tachycardia)    Ablation per Dr. Lovena Le in November 2005  . URI (upper respiratory infection)     Past Surgical History:  Procedure Laterality Date  . LEFT HEART CATHETERIZATION WITH CORONARY ANGIOGRAM N/A 02/16/2011   Procedure: LEFT HEART CATHETERIZATION WITH CORONARY ANGIOGRAM;  Surgeon: Burnell Blanks, MD;  Location[facility];  Service: Cardiovascular;  Laterality: N/A;  . TUBAL LIGATION      Current Medications: Outpatient Medications Prior to Visit  Medication Sig Dispense Refill  . albuterol (PROVENTIL HFA;VENTOLIN HFA) 108 (90 BASE) MCG/ACT inhaler Inhale 2 puffs into the lungs every 6 (six) hours as needed for wheezing. 1 Inhaler 2  . ALPRAZolam (XANAX) 0.5 MG tablet TAKE ONE TABLET TWICE DAILY AS NEEDED 60 tablet 0  . aspirin 81 MG tablet Take 81 mg by mouth daily.     Marland Kitchen atorvastatin (LIPITOR) 40 MG tablet TAKE ONE (1) TABLET EACH DAY 30 tablet 0  . Blood Glucose Monitoring Suppl (BAYER CONTOUR MONITOR) W/DEVICE KIT by Does not apply route.    Marland Kitchen glucose blood (PRODIGY AUTOCODE TEST) test strip 1 each by Other route 3 (three) times daily. Use as instructed     . insulin aspart (NOVOLOG) 100 UNIT/ML injection Inject 4 Units into  the skin 3 (three) times daily before meals. Sliding scale 5 units to 12 units    . levothyroxine (SYNTHROID, LEVOTHROID) 100 MCG tablet Take 100 mcg by mouth daily before breakfast.    . metFORMIN (GLUCOPHAGE) 500 MG tablet Take 500 mg by mouth 2 (two) times daily with a meal.    . naproxen (NAPROSYN) 500 MG tablet Take 1 tablet (500 mg total) by mouth 2 (two) times daily with a meal. 60 tablet 1  . omeprazole (PRILOSEC) 40 MG capsule Take 1 capsule (40 mg total) by mouth daily. 30 capsule 4  . PRODIGY LANCETS 21G MISC by Does not apply route 3 (three) times daily.      . sitaGLIPtin (JANUVIA) 100 MG tablet Take 1 tablet (100 mg total) by mouth daily. 30 tablet 2   No facility-administered medications prior to visit.      Allergies:   Patient has no known allergies.   Social History   Social History  . Marital status: Married    Spouse name: N/A  . Number of children: 4  . Years of education: N/A   Occupational History  . Unemployed-Disability    Social History Main Topics  . Smoking status: Never Smoker  . Smokeless tobacco: Not on file  . Alcohol use No  . Drug use: No  . Sexual activity:  Not on file   Other Topics Concern  . Not on file   Social History Narrative  . No narrative on file     Family History:  The patient's ***family history includes Cancer in her mother; Diabetes in her mother; Heart disease in her maternal uncle.   ROS:   Please see the history of present illness.    ROS All other systems reviewed and are negative.   PHYSICAL EXAM:   VS:  There were no vitals taken for this visit.   GEN: Well nourished, well developed, in no acute distress  HEENT: normal  Neck: no JVD, carotid bruits, or masses Cardiac: ***RRR; no murmurs, rubs, or gallops,no edema  Respiratory:  clear to auscultation bilaterally, normal work of breathing GI: soft, nontender, nondistended, + BS MS: no deformity or atrophy  Skin: warm and dry, no rash Neuro:  Alert and  Oriented x 3, Strength and sensation are intact Psych: euthymic mood, full affect  Wt Readings from Last 3 Encounters:  05/22/13 183 lb (83 kg)  05/17/13 182 lb (82.6 kg)  04/30/13 191 lb (86.6 kg)      Studies/Labs Reviewed:   EKG:  EKG is*** ordered today.  The ekg ordered today demonstrates ***  Recent Labs: No results found for requested labs within last 8760 hours.   Lipid Panel    Component Value Date/Time   CHOL 187 08/28/2012 1048   TRIG 56 08/28/2012 1048   HDL 60 08/28/2012 1048   LDLCALC 116 (H) 08/28/2012 1048   Labs dated 05/05/16: CMET normal. TSH 23.36. Cholesterol 154, triglycerides 42, HDL 65, LDL 81.   Additional studies/ records that were reviewed today include:  Echo 02/01/11: Study Conclusions  Left ventricle: The cavity size was normal. Wall thickness was normal. Systolic function was normal. The estimated ejection fraction was in the range of 55% to 60%. Wall motion was normal; there were no regional wall motion abnormalities. Left ventricular diastolic function parameters were normal.   Myoview 02/01/11: mild apical ischemia. Ecg at that time showed 1.5 mm ST depression in the inferior leads. And 1 mm depression in leads V5-6.   Cardiac Catheterization Operative Report  Kathleen Norris [id number] 11/20/20129:27 AM No primary provider on file.  Procedure Performed:  1. Left Heart Catheterization 2. Selective Coronary Angiography 3. Left ventricular angiogram  Operator: Lauree Chandler, MD  Indication: Chest pain, abnormal stress test                                       Procedure Details: The risks, benefits, complications, treatment options, and expected outcomes were discussed with the patient. The patient and/or family concurred with the proposed plan, giving informed consent. The patient was brought to the cath lab after IV hydration was begun and oral premedication was given. The patient was further sedated with Versed and  Fentanyl. The right wrist was prepped and draped in a sterile fashion after the Allens test was positive. Using the modified Seldinger access technique, a 5 French sheath was placed in the right radial artery. Standard diagnostic catheters were used to perform selective coronary angiography. A pigtail catheter was used to perform a left ventricular angiogram. There were no immediate complications. The sheath was removed in the cath lab and a Terumo hemostasis band was applied to there arteriotomy. The patient was taken to the recovery area in stable condition.   Coronary Interventions:  Hemodynamic Findings: Central aortic pressure:121/67 Left ventricular pressure:125/6/10  Angiographic Findings:  Left main: No disease.  Left Anterior Descending Artery: No disease.   Circumflex Artery: No disease.  Right Coronary Artery: No disease.  Left Ventricular Angiogram: LVEF=55-60%.   Impression: 1. No angiographic evidence of CAD 2. Normal LV systolic function.   Recommendations: No further cardiac workup.   ASSESSMENT:    No diagnosis found.   PLAN:  In order of problems listed above:  4. ***    Medication Adjustments/Labs and Tests Ordered: Current medicines are reviewed at length with the patient today.  Concerns regarding medicines are outlined above.  Medication changes, Labs and Tests ordered today are listed in the Patient Instructions below. There are no Patient Instructions on file for this visit.   Signed, Peter Martinique, MD  06/06/2016 8:11 AM    [facility] [address] [place], [place], [address] [phone]

## 2016-06-07 ENCOUNTER — Ambulatory Visit: Payer: Medicaid Other | Admitting: Cardiology

## 2016-06-09 ENCOUNTER — Ambulatory Visit: Payer: Self-pay | Admitting: "Endocrinology

## 2016-06-15 DIAGNOSIS — M545 Low back pain: Secondary | ICD-10-CM | POA: Diagnosis not present

## 2016-06-15 DIAGNOSIS — R072 Precordial pain: Secondary | ICD-10-CM | POA: Diagnosis not present

## 2016-06-15 DIAGNOSIS — M47816 Spondylosis without myelopathy or radiculopathy, lumbar region: Secondary | ICD-10-CM | POA: Diagnosis not present

## 2016-06-15 DIAGNOSIS — M5136 Other intervertebral disc degeneration, lumbar region: Secondary | ICD-10-CM | POA: Diagnosis not present

## 2016-06-15 DIAGNOSIS — J4521 Mild intermittent asthma with (acute) exacerbation: Secondary | ICD-10-CM | POA: Diagnosis not present

## 2016-06-18 ENCOUNTER — Ambulatory Visit: Payer: Medicaid Other | Admitting: Cardiology

## 2016-06-24 DIAGNOSIS — R0602 Shortness of breath: Secondary | ICD-10-CM | POA: Diagnosis not present

## 2016-06-30 DIAGNOSIS — S134XXA Sprain of ligaments of cervical spine, initial encounter: Secondary | ICD-10-CM | POA: Diagnosis not present

## 2016-06-30 DIAGNOSIS — M19071 Primary osteoarthritis, right ankle and foot: Secondary | ICD-10-CM | POA: Diagnosis not present

## 2016-06-30 DIAGNOSIS — M19072 Primary osteoarthritis, left ankle and foot: Secondary | ICD-10-CM | POA: Diagnosis not present

## 2016-06-30 DIAGNOSIS — M170 Bilateral primary osteoarthritis of knee: Secondary | ICD-10-CM | POA: Diagnosis not present

## 2016-07-24 NOTE — Progress Notes (Deleted)
Cardiology Office Note    Date:  07/24/2016   ID:  Kathleen Norris, DOB [birth year], MRN [id number]  PCP:  No primary care provider on file.  Cardiologist:  Peter Martinique, MD    History of Present Illness:  Kathleen Norris is a 51 y.o. female seen at the request of Dr. Quillian Quince for evaluation of an abnormal stress test. She has a history of IDDM. She was evaluated in 2012 with a normal Echo. A Myoview study showed mild apical ischemia. She was noted on exercise tracings to have inferior and lateral ST depression. She subsequently underwent cardiac cath by Dr. Angelena Form demonstrating normal coronary anatomy and normal LV function.     Past Medical History:  Diagnosis Date  . Chronic anxiety   . Chronic pain   . Diabetes mellitus    Diagnosed 1997  . Diabetes with ketoacidosis   . Sinusitis   . SVT (supraventricular tachycardia)    Ablation per Dr. Lovena Le in November 2005  . URI (upper respiratory infection)     Past Surgical History:  Procedure Laterality Date  . LEFT HEART CATHETERIZATION WITH CORONARY ANGIOGRAM N/A 02/16/2011   Procedure: LEFT HEART CATHETERIZATION WITH CORONARY ANGIOGRAM;  Surgeon: Burnell Blanks, MD;  Location: [facility] CATH LAB;  Service: Cardiovascular;  Laterality: N/A;  . TUBAL LIGATION      Current Medications: Outpatient Medications Prior to Visit  Medication Sig Dispense Refill  . albuterol (PROVENTIL HFA;VENTOLIN HFA) 108 (90 BASE) MCG/ACT inhaler Inhale 2 puffs into the lungs every 6 (six) hours as needed for wheezing. 1 Inhaler 2  . ALPRAZolam (XANAX) 0.5 MG tablet TAKE ONE TABLET TWICE DAILY AS NEEDED 60 tablet 0  . aspirin 81 MG tablet Take 81 mg by mouth daily.     Marland Kitchen atorvastatin (LIPITOR) 40 MG tablet TAKE ONE (1) TABLET EACH DAY 30 tablet 0  . Blood Glucose Monitoring Suppl (BAYER CONTOUR MONITOR) W/DEVICE KIT by Does not apply route.    Marland Kitchen glucose blood (PRODIGY AUTOCODE TEST) test strip 1 each by Other route 3 (three) times daily. Use as  instructed     . insulin aspart (NOVOLOG) 100 UNIT/ML injection Inject 4 Units into the skin 3 (three) times daily before meals. Sliding scale 5 units to 12 units    . levothyroxine (SYNTHROID, LEVOTHROID) 100 MCG tablet Take 100 mcg by mouth daily before breakfast.    . metFORMIN (GLUCOPHAGE) 500 MG tablet Take 500 mg by mouth 2 (two) times daily with a meal.    . naproxen (NAPROSYN) 500 MG tablet Take 1 tablet (500 mg total) by mouth 2 (two) times daily with a meal. 60 tablet 1  . omeprazole (PRILOSEC) 40 MG capsule Take 1 capsule (40 mg total) by mouth daily. 30 capsule 4  . PRODIGY LANCETS 21G MISC by Does not apply route 3 (three) times daily.      . sitaGLIPtin (JANUVIA) 100 MG tablet Take 1 tablet (100 mg total) by mouth daily. 30 tablet 2   No facility-administered medications prior to visit.      Allergies:   Patient has no known allergies.   Social History   Social History  . Marital status: Married    Spouse name: N/A  . Number of children: 4  . Years of education: N/A   Occupational History  . Unemployed-Disability    Social History Main Topics  . Smoking status: Never Smoker  . Smokeless tobacco: Not on file  . Alcohol use No  .  Drug use: No  . Sexual activity: Not on file   Other Topics Concern  . Not on file   Social History Narrative  . No narrative on file     Family History:  The patient's family history includes Cancer in her mother; Diabetes in her mother; Heart disease in her maternal uncle.   ROS:   Please see the history of present illness.    ROS All other systems reviewed and are negative.   PHYSICAL EXAM:   VS:  There were no vitals taken for this visit.   GEN: Well nourished, well developed, in no acute distress  HEENT: normal  Neck: no JVD, carotid bruits, or masses Cardiac: RRR; no murmurs, rubs, or gallops,no edema  Respiratory:  clear to auscultation bilaterally, normal work of breathing GI: soft, nontender, nondistended, + BS MS:  no deformity or atrophy  Skin: warm and dry, no rash Neuro:  Alert and Oriented x 3, Strength and sensation are intact Psych: euthymic mood, full affect  Wt Readings from Last 3 Encounters:  05/22/13 183 lb (83 kg)  05/17/13 182 lb (82.6 kg)  04/30/13 191 lb (86.6 kg)      Studies/Labs Reviewed:   EKG:  EKG is*** ordered today.  The ekg ordered today demonstrates ***  Recent Labs: No results found for requested labs within last 8760 hours.   Lipid Panel    Component Value Date/Time   CHOL 187 08/28/2012 1048   TRIG 56 08/28/2012 1048   HDL 60 08/28/2012 1048   LDLCALC 116 (H) 08/28/2012 1048    Additional studies/ records that were reviewed today include:   Labs dated 05/05/16: cholesterol 154, triglycerides 42, HDL 65, LDL 81. A1c 13.3%. TSH 23.36. CMET normal.   ASSESSMENT:    No diagnosis found.   PLAN:  In order of problems listed above:  1. ***    Medication Adjustments/Labs and Tests Ordered: Current medicines are reviewed at length with the patient today.  Concerns regarding medicines are outlined above.  Medication changes, Labs and Tests ordered today are listed in the Patient Instructions below. There are no Patient Instructions on file for this visit.   Signed, Peter Martinique, MD  07/24/2016 1:24 PM    [facility] [address], [place], [place], [address] [phone]

## 2016-07-27 ENCOUNTER — Encounter: Payer: Self-pay | Admitting: *Deleted

## 2016-07-27 ENCOUNTER — Ambulatory Visit: Payer: Medicaid Other | Admitting: Cardiology

## 2016-08-04 DIAGNOSIS — R0602 Shortness of breath: Secondary | ICD-10-CM | POA: Diagnosis not present

## 2016-08-04 DIAGNOSIS — J209 Acute bronchitis, unspecified: Secondary | ICD-10-CM | POA: Diagnosis not present

## 2016-08-04 DIAGNOSIS — E1065 Type 1 diabetes mellitus with hyperglycemia: Secondary | ICD-10-CM | POA: Diagnosis not present

## 2016-08-04 DIAGNOSIS — E039 Hypothyroidism, unspecified: Secondary | ICD-10-CM | POA: Diagnosis not present

## 2016-08-04 DIAGNOSIS — E782 Mixed hyperlipidemia: Secondary | ICD-10-CM | POA: Diagnosis not present

## 2016-08-04 DIAGNOSIS — I10 Essential (primary) hypertension: Secondary | ICD-10-CM | POA: Diagnosis not present

## 2016-08-04 DIAGNOSIS — R05 Cough: Secondary | ICD-10-CM | POA: Diagnosis not present

## 2016-08-04 DIAGNOSIS — Z1389 Encounter for screening for other disorder: Secondary | ICD-10-CM | POA: Diagnosis not present

## 2016-08-06 DIAGNOSIS — M79603 Pain in arm, unspecified: Secondary | ICD-10-CM | POA: Diagnosis not present

## 2016-08-06 DIAGNOSIS — M9981 Other biomechanical lesions of cervical region: Secondary | ICD-10-CM | POA: Diagnosis not present

## 2016-08-06 DIAGNOSIS — R200 Anesthesia of skin: Secondary | ICD-10-CM | POA: Diagnosis not present

## 2016-08-06 DIAGNOSIS — M47812 Spondylosis without myelopathy or radiculopathy, cervical region: Secondary | ICD-10-CM | POA: Diagnosis not present

## 2016-08-06 DIAGNOSIS — M542 Cervicalgia: Secondary | ICD-10-CM | POA: Diagnosis not present

## 2016-08-11 DIAGNOSIS — N6489 Other specified disorders of breast: Secondary | ICD-10-CM | POA: Diagnosis not present

## 2016-08-11 DIAGNOSIS — R928 Other abnormal and inconclusive findings on diagnostic imaging of breast: Secondary | ICD-10-CM | POA: Diagnosis not present

## 2016-08-19 ENCOUNTER — Other Ambulatory Visit: Payer: Self-pay | Admitting: Neurosurgery

## 2016-08-19 DIAGNOSIS — M5412 Radiculopathy, cervical region: Secondary | ICD-10-CM | POA: Diagnosis not present

## 2016-08-26 ENCOUNTER — Ambulatory Visit: Payer: Self-pay | Admitting: "Endocrinology

## 2016-08-27 ENCOUNTER — Encounter (HOSPITAL_COMMUNITY)
Admission: RE | Admit: 2016-08-27 | Discharge: 2016-08-27 | Disposition: A | Discharge status: Home / Self Care | Payer: Medicare Other | Source: Ambulatory Visit | Attending: Neurosurgery | Admitting: Neurosurgery

## 2016-08-27 ENCOUNTER — Encounter (HOSPITAL_COMMUNITY): Payer: Self-pay

## 2016-08-27 DIAGNOSIS — M4802 Spinal stenosis, cervical region: Secondary | ICD-10-CM | POA: Insufficient documentation

## 2016-08-27 DIAGNOSIS — Z7982 Long term (current) use of aspirin: Secondary | ICD-10-CM | POA: Diagnosis not present

## 2016-08-27 DIAGNOSIS — Z79899 Other long term (current) drug therapy: Secondary | ICD-10-CM | POA: Insufficient documentation

## 2016-08-27 DIAGNOSIS — J45909 Unspecified asthma, uncomplicated: Secondary | ICD-10-CM | POA: Insufficient documentation

## 2016-08-27 DIAGNOSIS — Z794 Long term (current) use of insulin: Secondary | ICD-10-CM | POA: Diagnosis not present

## 2016-08-27 DIAGNOSIS — Z01818 Encounter for other preprocedural examination: Secondary | ICD-10-CM | POA: Diagnosis not present

## 2016-08-27 DIAGNOSIS — E119 Type 2 diabetes mellitus without complications: Secondary | ICD-10-CM | POA: Insufficient documentation

## 2016-08-27 DIAGNOSIS — Z01812 Encounter for preprocedural laboratory examination: Secondary | ICD-10-CM | POA: Insufficient documentation

## 2016-08-27 DIAGNOSIS — K219 Gastro-esophageal reflux disease without esophagitis: Secondary | ICD-10-CM | POA: Insufficient documentation

## 2016-08-27 HISTORY — DX: Gastro-esophageal reflux disease without esophagitis: K21.9

## 2016-08-27 HISTORY — DX: Unspecified asthma, uncomplicated: J45.909

## 2016-08-27 HISTORY — DX: Unspecified osteoarthritis, unspecified site: M19.90

## 2016-08-27 LAB — BASIC METABOLIC PANEL
Anion gap: 9 (ref 5–15)
BUN: 16 mg/dL (ref 6–20)
CHLORIDE: 100 mmol/L — AB (ref 101–111)
CO2: 24 mmol/L (ref 22–32)
CREATININE: 0.78 mg/dL (ref 0.44–1.00)
Calcium: 8.7 mg/dL — ABNORMAL LOW (ref 8.9–10.3)
Glucose, Bld: 327 mg/dL — ABNORMAL HIGH (ref 65–99)
Potassium: 3.7 mmol/L (ref 3.5–5.1)
SODIUM: 133 mmol/L — AB (ref 135–145)

## 2016-08-27 LAB — SURGICAL PCR SCREEN
MRSA, PCR: POSITIVE — AB
STAPHYLOCOCCUS AUREUS: POSITIVE — AB

## 2016-08-27 LAB — CBC WITH DIFFERENTIAL/PLATELET
BASOS ABS: 0 10*3/uL (ref 0.0–0.1)
BASOS PCT: 1 %
EOS ABS: 0.2 10*3/uL (ref 0.0–0.7)
EOS PCT: 3 %
HCT: 40.7 % (ref 36.0–46.0)
HEMOGLOBIN: 13.9 g/dL (ref 12.0–15.0)
LYMPHS ABS: 2.7 10*3/uL (ref 0.7–4.0)
Lymphocytes Relative: 41 %
MCH: 30.4 pg (ref 26.0–34.0)
MCHC: 34.2 g/dL (ref 30.0–36.0)
MCV: 89.1 fL (ref 78.0–100.0)
Monocytes Absolute: 0.3 10*3/uL (ref 0.1–1.0)
Monocytes Relative: 4 %
NEUTROS PCT: 51 %
Neutro Abs: 3.4 10*3/uL (ref 1.7–7.7)
PLATELETS: 312 10*3/uL (ref 150–400)
RBC: 4.57 MIL/uL (ref 3.87–5.11)
RDW: 13 % (ref 11.5–15.5)
WBC: 6.6 10*3/uL (ref 4.0–10.5)

## 2016-08-27 LAB — GLUCOSE, CAPILLARY: GLUCOSE-CAPILLARY: 313 mg/dL — AB (ref 65–99)

## 2016-08-27 NOTE — Pre-Procedure Instructions (Addendum)
CYAN CLIPPINGER  08/27/2016      [facility] - [place] [place] - [address] [place] [address] Phone: [phone] Fax: [phone]    Your procedure is scheduled on 09/03/16  Report to [facility] Admitting at 1115 A.M.  Call this number if you have problems the morning of surgery:  [phone]   Remember:  Do not eat food or drink liquids after midnight.  Take these medicines the morning of surgery with A SIP OF WATER    Inhaler if needed (bring with you),xanax if needed,citalopram(celexa),levothyroxine,pantoprazole,topamax if needed  STOP all herbel meds, nsaids (aleve,naproxen,advil,ibuprofen)7days prior to surgery starting today 08/27/16 including all vitamns/supplements, aspirin   No metformin day of surgery    How to Manage Your Diabetes Before and After Surgery  Why is it important to control my blood sugar before and after surgery? . Improving blood sugar levels before and after surgery helps healing and can limit problems. . A way of improving blood sugar control is eating a healthy diet by: o  Eating less sugar and carbohydrates o  Increasing activity/exercise o  Talking with your doctor about reaching your blood sugar goals . High blood sugars (greater than 180 mg/dL) can raise your risk of infections and slow your recovery, so you will need to focus on controlling your diabetes during the weeks before surgery. . Make sure that the doctor who takes care of your diabetes knows about your planned surgery including the date and location.  How do I manage my blood sugar before surgery? . Check your blood sugar at least 4 times a day, starting 2 days before surgery, to make sure that the level is not too high or low. o Check your blood sugar the morning of your surgery when you wake up and every 2 hours until you get to the Short Stay unit. . If your blood sugar is less than 70 mg/dL, you will need to treat for low blood  sugar: o Do not take insulin. o Treat a low blood sugar (less than 70 mg/dL) with  cup of clear juice (cranberry or apple), 4 glucose tablets, OR glucose gel. o Recheck blood sugar in 15 minutes after treatment (to make sure it is greater than 70 mg/dL). If your blood sugar is not greater than 70 mg/dL on recheck, call [phone] for further instructions. . Report your blood sugar to the short stay nurse when you get to Short Stay.  . If you are admitted to the hospital after surgery: o Your blood sugar will be checked by the staff and you will probably be given insulin after surgery (instead of oral diabetes medicines) to make sure you have good blood sugar levels. o The goal for blood sugar control after surgery is 80-180 mg/dL  WHAT DO I DO ABOUT MY DIABETES MEDICATION?   Marland Kitchen Do not take oral diabetes medicines (pills) the morning of surgery.(No metformin)  . THE NIGHT BEFORE SURGERY, take  units of lantus insulin. Patient  To call dr Olena Heckle to get instructions on amount of insulin per dr a Jonah Blue .  Marland Kitchen No bedtime dose humalog --usual meal dose      . THE MORNING OF SURGERY, take units of lantus insulin. .  . Do not take humalog insulin except as instructed below  . If your CBG is greater than 220 mg/dL, you may take  of your sliding scale (correction) dose of insulin.  Do not wear jewelry, make-up or nail polish.  Do not wear lotions, powders, or perfumes, or deoderant.  Do not shave 48 hours prior to surgery.  Men may shave face and neck.  Do not bring valuables to the hospital.  [facility] is not responsible for any belongings or valuables.  Contacts, dentures or bridgework may not be worn into surgery.  Leave your suitcase in the car.  After surgery it may be brought to your room.  For patients admitted to the hospital, discharge time will be determined by your treatment team.  Patients discharged the day of surgery will not be allowed to drive home.   Special  instructions:   Special Instructions: [place] - Preparing for Surgery  Before surgery, you can play an important role.  Because skin is not sterile, your skin needs to be as free of germs as possible.  You can reduce the number of germs on you skin by washing with CHG (chlorahexidine gluconate) soap before surgery.  CHG is an antiseptic cleaner which kills germs and bonds with the skin to continue killing germs even after washing.  Please DO NOT use if you have an allergy to CHG or antibacterial soaps.  If your skin becomes reddened/irritated stop using the CHG and inform your nurse when you arrive at Short Stay.  Do not shave (including legs and underarms) for at least 48 hours prior to the first CHG shower.  You may shave your face.  Please follow these instructions carefully:   1.  Shower with CHG Soap the night before surgery and the morning of Surgery.  2.  If you choose to wash your hair, wash your hair first as usual with your normal shampoo.  3.  After you shampoo, rinse your hair and body thoroughly to remove the Shampoo.  4.  Use CHG as you would any other liquid soap.  You can apply chg directly  to the skin and wash gently with scrungie or a clean washcloth.  5.  Apply the CHG Soap to your body ONLY FROM THE NECK DOWN.  Do not use on open wounds or open sores.  Avoid contact with your eyes ears, mouth and genitals (private parts).  Wash genitals (private parts)       with your normal soap.  6.  Wash thoroughly, paying special attention to the area where your surgery will be performed.  7.  Thoroughly rinse your body with warm water from the neck down.  8.  DO NOT shower/wash with your normal soap after using and rinsing off the CHG Soap.  9.  Pat yourself dry with a clean towel.            10.  Wear clean pajamas.            11.  Place clean sheets on your bed the night of your first shower and do not sleep with pets.  Day of Surgery  Do not apply any lotions/deodorants the  morning of surgery.  Please wear clean clothes to the hospital/surgery center.  Please read over the  fact sheets that you were given.

## 2016-08-28 LAB — HEMOGLOBIN A1C
Hgb A1c MFr Bld: 13.2 % — ABNORMAL HIGH (ref 4.8–5.6)
Mean Plasma Glucose: 332 mg/dL

## 2016-08-30 NOTE — Progress Notes (Addendum)
Anesthesia Chart Review:  Pt is a 51 year old female scheduled for C4-5, C5-6 ACDF on 09/03/2016 with Earnie Larsson, MD  - PCP is Gar Ponto, MD in [place] has new pt appointment with cardiologist Carlyle Dolly, MD 08/31/16  PMH includes:  DM, SVT (s/p ablation 2009), asthma, GERD.  Never smoker. BMI 27.5  Medications include: Albuterol, ASA 81 mg, Lipitor, Lantus, Humalog, levothyroxine, metformin, Prilosec, Protonix, sitagliptin  Preoperative labs reviewed.  HbA1c 13.2, glucose 327.  This is consistent with prior results (A1c was 13.3 on 05/04/16)  EKG 08/27/16: Sinus bradycardia (59 bpm). Nonspecific T wave abnormality  Exercise tolerance test 05/25/16:  - Normal heart rate response, with a normal blood pressure response. Patient experienced no chest pain. Test was terminated due to fatigue, dyspnea and achievement of 85% MPHR. An adequate level of stress was achieved.  Cardiac cath 02/16/11:  1. No angiographic evidence of CAD 2. Normal LV systolic function.   Echo 02/01/11:  - Left ventricle: The cavity size was normal. Wall thickness was normal. Systolic function was normal. The estimated ejection fraction was in the range of 55% to 60%. Wall motion was normal; there were no regional wall motion abnormalities. Left ventricular diastolic function parameters were normal.    I notified Vanessa in Dr. Marchelle Folks office pt will need medical clearance for uncontrolled DM prior to surgery.   Will revisit chart after appointment with Dr. Harl Bowie.   Willeen Cass, FNP-BC [facility] Short Stay Surgical Center/Anesthesiology Phone: [phone] 08/30/2016 4:08 PM  Addendum:   Pt was evaluated by PCP 08/31/16 and "cleared to proceed with procedure as long as there is glucose control perioperatively and postoperatively".  I notified the diabetes coordinator of date/time of surgery.   Pt was a no show for cardiology appt 08/31/16.  When asked by surgeon's office the purpose of the appt, pt reported "she was  to see cardiologist because she had pneumonia a while ago".  Pt did have normal coronaries by cath in 2012 and a normal echo in 2012.   Reviewed case with Dr. Tobias Alexander. If blood glucose acceptable DOS, I anticipate pt can proceed with surgery as scheduled.   Willeen Cass, FNP-BC [facility] Short Stay Surgical Center/Anesthesiology Phone: [phone] 09/02/2016 3:05 PM

## 2016-08-31 ENCOUNTER — Ambulatory Visit: Payer: Medicaid Other | Admitting: Cardiology

## 2016-08-31 ENCOUNTER — Encounter: Payer: Self-pay | Admitting: Cardiology

## 2016-08-31 DIAGNOSIS — M542 Cervicalgia: Secondary | ICD-10-CM | POA: Diagnosis not present

## 2016-08-31 DIAGNOSIS — E1165 Type 2 diabetes mellitus with hyperglycemia: Secondary | ICD-10-CM | POA: Diagnosis not present

## 2016-08-31 NOTE — Progress Notes (Deleted)
Clinical Summary Kathleen Norris is a 51 y.o.female  1. Chest pain  - previous workup in 2012. Stress myoview 01/2011 with small partially reversible distal anterior defect and apex - 01/2011 cath no CAD - 04/2016 exercise stress test at [facility] with 1 mm ST depression inferior leads. Only exercised 3 minutes, 4.6 METs.   2. PSVT - history of ablation in 2005  3. Preoperative  being considered for neck surgery Past Medical History:  Diagnosis Date  . Arthritis   . Asthma   . Chronic anxiety   . Chronic pain   . Diabetes mellitus    Diagnosed 1997  . Diabetes with ketoacidosis   . GERD (gastroesophageal reflux disease)   . Sinusitis   . SVT (supraventricular tachycardia) ([place])    Ablation per Dr. Lovena Le in November 2005  . URI (upper respiratory infection)      No Known Allergies   Current Outpatient Prescriptions  Medication Sig Dispense Refill  . albuterol (PROVENTIL HFA;VENTOLIN HFA) 108 (90 BASE) MCG/ACT inhaler Inhale 2 puffs into the lungs every 6 (six) hours as needed for wheezing. 1 Inhaler 2  . ALPRAZolam (XANAX) 0.5 MG tablet TAKE ONE TABLET TWICE DAILY AS NEEDED (Patient taking differently: TAKE ONE TABLET THREE TIMES DAILY) 60 tablet 0  . aspirin 81 MG tablet Take 81 mg by mouth daily.     Marland Kitchen atorvastatin (LIPITOR) 80 MG tablet Take 80 mg by mouth daily.    . Blood Glucose Monitoring Suppl (BAYER CONTOUR MONITOR) W/DEVICE KIT by Does not apply route.    . citalopram (CELEXA) 20 MG tablet Take 20 mg by mouth daily.    Marland Kitchen glucose blood (PRODIGY AUTOCODE TEST) test strip 1 each by Other route 3 (three) times daily. Use as instructed     . ibuprofen (ADVIL,MOTRIN) 200 MG tablet Take 400 mg by mouth every 6 (six) hours as needed for mild pain or moderate pain.    Marland Kitchen insulin glargine (LANTUS) 100 UNIT/ML injection Inject 70 Units into the skin at bedtime.    . insulin lispro (HUMALOG) 100 UNIT/ML injection Inject 3-15 Units into the skin 3 (three) times daily before  meals. Per sliding scale    . levothyroxine (SYNTHROID, LEVOTHROID) 200 MCG tablet Take 200 mcg by mouth daily before breakfast.    . Lidocaine 4 % PTCH Apply 1 patch topically daily as needed (pain).    . metFORMIN (GLUCOPHAGE) 500 MG tablet Take 500 mg by mouth 4 (four) times daily -  before meals and at bedtime.     . naproxen (NAPROSYN) 500 MG tablet Take 1 tablet (500 mg total) by mouth 2 (two) times daily with a meal. (Patient taking differently: Take 500 mg by mouth 3 (three) times daily with meals. ) 60 tablet 1  . naproxen sodium (ANAPROX) 220 MG tablet Take 220 mg by mouth 3 (three) times daily as needed (pain).    Marland Kitchen omeprazole (PRILOSEC) 40 MG capsule Take 1 capsule (40 mg total) by mouth daily. (Patient taking differently: Take 40 mg by mouth 2 (two) times daily. ) 30 capsule 4  . pantoprazole (PROTONIX) 40 MG tablet Take 40 mg by mouth 2 (two) times daily.    Marland Kitchen PRODIGY LANCETS 21G MISC by Does not apply route 3 (three) times daily.      . sitaGLIPtin (JANUVIA) 100 MG tablet Take 1 tablet (100 mg total) by mouth daily. (Patient not taking: Reported on 08/25/2016) 30 tablet 2  . topiramate (TOPAMAX) 50 MG  tablet Take 50 mg by mouth 2 (two) times daily.     No current facility-administered medications for this visit.      Past Surgical History:  Procedure Laterality Date  . LEFT HEART CATHETERIZATION WITH CORONARY ANGIOGRAM N/A 02/16/2011   Procedure: LEFT HEART CATHETERIZATION WITH CORONARY ANGIOGRAM;  Surgeon: Burnell Blanks, MD;  Location[facility] CATH LAB;  Service: Cardiovascular;  Laterality: N/A;  . TUBAL LIGATION       No Known Allergies    Family History  Problem Relation Age of Onset  . Heart disease Maternal Uncle   . Diabetes Mother   . Cancer Mother      Social History Ms. Mendibles reports that she has never smoked. She has never used smokeless tobacco. Ms. Spindler reports that she does not drink alcohol.   Review of Systems CONSTITUTIONAL: No weight  loss, fever, chills, weakness or fatigue.  HEENT: Eyes: No visual loss, blurred vision, double vision or yellow sclerae.No hearing loss, sneezing, congestion, runny nose or sore throat.  SKIN: No rash or itching.  CARDIOVASCULAR:  RESPIRATORY: No shortness of breath, cough or sputum.  GASTROINTESTINAL: No anorexia, nausea, vomiting or diarrhea. No abdominal pain or blood.  GENITOURINARY: No burning on urination, no polyuria NEUROLOGICAL: No headache, dizziness, syncope, paralysis, ataxia, numbness or tingling in the extremities. No change in bowel or bladder control.  MUSCULOSKELETAL: No muscle, back pain, joint pain or stiffness.  LYMPHATICS: No enlarged nodes. No history of splenectomy.  PSYCHIATRIC: No history of depression or anxiety.  ENDOCRINOLOGIC: No reports of sweating, cold or heat intolerance. No polyuria or polydipsia.  Marland Kitchen   Physical Examination There were no vitals filed for this visit. There were no vitals filed for this visit.  Gen: resting comfortably, no acute distress HEENT: no scleral icterus, pupils equal round and reactive, no palptable cervical adenopathy,  CV Resp: Clear to auscultation bilaterally GI: abdomen is soft, non-tender, non-distended, normal bowel sounds, no hepatosplenomegaly MSK: extremities are warm, no edema.  Skin: warm, no rash Neuro:  no focal deficits Psych: appropriate affect   Diagnostic Studies 01/2011 cath Coronary Interventions:  Hemodynamic Findings: Central aortic pressure:121/67 Left ventricular pressure:125/6/10  Angiographic Findings:  Left main: No disease.  Left Anterior Descending Artery: No disease.   Circumflex Artery: No disease.  Right Coronary Artery: No disease.  Left Ventricular Angiogram: LVEF=55-60%.   Impression: 1. No angiographic evidence of CAD 2. Normal LV systolic function.   Recommendations: No further cardiac workup.        Complications:  None; patient tolerated the procedure  well.    Assessment and Plan        Arnoldo Lenis, M.D., F.A.C.C.

## 2016-09-03 ENCOUNTER — Ambulatory Visit (HOSPITAL_COMMUNITY): Payer: Medicare Other

## 2016-09-03 ENCOUNTER — Ambulatory Visit (HOSPITAL_COMMUNITY): Payer: Medicare Other | Admitting: Anesthesiology

## 2016-09-03 ENCOUNTER — Encounter (HOSPITAL_COMMUNITY)
Admission: RE | Disposition: A | Discharge status: Home / Self Care | Payer: Self-pay | Source: Ambulatory Visit | Attending: Neurosurgery

## 2016-09-03 ENCOUNTER — Observation Stay (HOSPITAL_COMMUNITY)
Admission: RE | Admit: 2016-09-03 | Discharge: 2016-09-04 | Disposition: A | Discharge status: Home / Self Care | Payer: Medicare Other | Source: Ambulatory Visit | Attending: Neurosurgery | Admitting: Neurosurgery

## 2016-09-03 ENCOUNTER — Ambulatory Visit (HOSPITAL_COMMUNITY): Payer: Medicare Other | Admitting: Emergency Medicine

## 2016-09-03 DIAGNOSIS — Z8249 Family history of ischemic heart disease and other diseases of the circulatory system: Secondary | ICD-10-CM | POA: Diagnosis not present

## 2016-09-03 DIAGNOSIS — M50122 Cervical disc disorder at C5-C6 level with radiculopathy: Secondary | ICD-10-CM | POA: Diagnosis not present

## 2016-09-03 DIAGNOSIS — M47812 Spondylosis without myelopathy or radiculopathy, cervical region: Secondary | ICD-10-CM | POA: Diagnosis not present

## 2016-09-03 DIAGNOSIS — Z791 Long term (current) use of non-steroidal anti-inflammatories (NSAID): Secondary | ICD-10-CM | POA: Diagnosis not present

## 2016-09-03 DIAGNOSIS — I471 Supraventricular tachycardia: Secondary | ICD-10-CM | POA: Diagnosis not present

## 2016-09-03 DIAGNOSIS — M50121 Cervical disc disorder at C4-C5 level with radiculopathy: Secondary | ICD-10-CM | POA: Diagnosis not present

## 2016-09-03 DIAGNOSIS — E1065 Type 1 diabetes mellitus with hyperglycemia: Secondary | ICD-10-CM | POA: Diagnosis not present

## 2016-09-03 DIAGNOSIS — M4722 Other spondylosis with radiculopathy, cervical region: Principal | ICD-10-CM | POA: Insufficient documentation

## 2016-09-03 DIAGNOSIS — Z419 Encounter for procedure for purposes other than remedying health state, unspecified: Secondary | ICD-10-CM

## 2016-09-03 DIAGNOSIS — K219 Gastro-esophageal reflux disease without esophagitis: Secondary | ICD-10-CM | POA: Diagnosis not present

## 2016-09-03 DIAGNOSIS — Z833 Family history of diabetes mellitus: Secondary | ICD-10-CM | POA: Diagnosis not present

## 2016-09-03 DIAGNOSIS — E119 Type 2 diabetes mellitus without complications: Secondary | ICD-10-CM | POA: Diagnosis not present

## 2016-09-03 DIAGNOSIS — Z794 Long term (current) use of insulin: Secondary | ICD-10-CM | POA: Diagnosis not present

## 2016-09-03 DIAGNOSIS — Z9889 Other specified postprocedural states: Secondary | ICD-10-CM | POA: Insufficient documentation

## 2016-09-03 DIAGNOSIS — Z9851 Tubal ligation status: Secondary | ICD-10-CM | POA: Diagnosis not present

## 2016-09-03 DIAGNOSIS — Z7982 Long term (current) use of aspirin: Secondary | ICD-10-CM | POA: Insufficient documentation

## 2016-09-03 DIAGNOSIS — M4802 Spinal stenosis, cervical region: Secondary | ICD-10-CM | POA: Insufficient documentation

## 2016-09-03 DIAGNOSIS — J45909 Unspecified asthma, uncomplicated: Secondary | ICD-10-CM | POA: Diagnosis not present

## 2016-09-03 DIAGNOSIS — M47817 Spondylosis without myelopathy or radiculopathy, lumbosacral region: Secondary | ICD-10-CM | POA: Diagnosis not present

## 2016-09-03 DIAGNOSIS — G8929 Other chronic pain: Secondary | ICD-10-CM | POA: Insufficient documentation

## 2016-09-03 DIAGNOSIS — E785 Hyperlipidemia, unspecified: Secondary | ICD-10-CM | POA: Diagnosis not present

## 2016-09-03 HISTORY — PX: ANTERIOR CERVICAL DECOMP/DISCECTOMY FUSION: SHX1161

## 2016-09-03 LAB — GLUCOSE, CAPILLARY
GLUCOSE-CAPILLARY: 46 mg/dL — AB (ref 65–99)
GLUCOSE-CAPILLARY: 386 mg/dL — AB (ref 65–99)
Glucose-Capillary: 47 mg/dL — ABNORMAL LOW (ref 65–99)
Glucose-Capillary: 59 mg/dL — ABNORMAL LOW (ref 65–99)
Glucose-Capillary: 90 mg/dL (ref 65–99)
Glucose-Capillary: 177 mg/dL — ABNORMAL HIGH (ref 65–99)

## 2016-09-03 SURGERY — ANTERIOR CERVICAL DECOMPRESSION/DISCECTOMY FUSION 2 LEVELS
Anesthesia: General

## 2016-09-03 MED ORDER — HYDROCODONE-ACETAMINOPHEN 5-325 MG PO TABS
1.0000 | ORAL_TABLET | ORAL | Status: DC | PRN
  Administered 2016-09-03 – 2016-09-04 (×5): 2 via ORAL
  Filled 2016-09-03 (×4): qty 2

## 2016-09-03 MED ORDER — THROMBIN 5000 UNITS EX SOLR
CUTANEOUS | Status: AC
  Filled 2016-09-03: qty 10000

## 2016-09-03 MED ORDER — CHLORHEXIDINE GLUCONATE CLOTH 2 % EX PADS
6.0000 | MEDICATED_PAD | Freq: Every day | CUTANEOUS | Status: DC
  Administered 2016-09-04: 6 via TOPICAL

## 2016-09-03 MED ORDER — CITALOPRAM HYDROBROMIDE 20 MG PO TABS
20.0000 mg | ORAL_TABLET | Freq: Every day | ORAL | Status: DC
  Administered 2016-09-04: 20 mg via ORAL
  Filled 2016-09-03 (×2): qty 1

## 2016-09-03 MED ORDER — LIDOCAINE 5 % EX PTCH
1.0000 | MEDICATED_PATCH | Freq: Every day | CUTANEOUS | Status: DC | PRN
  Filled 2016-09-03: qty 1

## 2016-09-03 MED ORDER — HYDROMORPHONE HCL 1 MG/ML IJ SOLN
INTRAMUSCULAR | Status: AC
  Filled 2016-09-03: qty 0.5

## 2016-09-03 MED ORDER — PROMETHAZINE HCL 25 MG/ML IJ SOLN: 6.2500 mg | INTRAMUSCULAR | Status: DC | PRN

## 2016-09-03 MED ORDER — DEXAMETHASONE SODIUM PHOSPHATE 10 MG/ML IJ SOLN
10.0000 mg | INTRAMUSCULAR | Status: AC
  Administered 2016-09-03: 10 mg via INTRAVENOUS
  Filled 2016-09-03: qty 1

## 2016-09-03 MED ORDER — ALPRAZOLAM 0.5 MG PO TABS
0.5000 mg | ORAL_TABLET | Freq: Three times a day (TID) | ORAL | Status: DC
  Administered 2016-09-03 – 2016-09-04 (×2): 0.5 mg via ORAL
  Filled 2016-09-03 (×2): qty 1

## 2016-09-03 MED ORDER — CYCLOBENZAPRINE HCL 10 MG PO TABS
10.0000 mg | ORAL_TABLET | Freq: Three times a day (TID) | ORAL | Status: DC | PRN
  Administered 2016-09-03 – 2016-09-04 (×2): 10 mg via ORAL
  Filled 2016-09-03: qty 1

## 2016-09-03 MED ORDER — METFORMIN HCL 500 MG PO TABS
500.0000 mg | ORAL_TABLET | Freq: Three times a day (TID) | ORAL | Status: DC
  Administered 2016-09-03 – 2016-09-04 (×2): 500 mg via ORAL
  Filled 2016-09-03 (×2): qty 1

## 2016-09-03 MED ORDER — CYCLOBENZAPRINE HCL 10 MG PO TABS
ORAL_TABLET | ORAL | Status: AC
  Filled 2016-09-03: qty 1

## 2016-09-03 MED ORDER — DEXTROSE 50 % IV SOLN
INTRAVENOUS | Status: AC
  Administered 2016-09-03: 12.5 mL via INTRAVENOUS
  Filled 2016-09-03: qty 50

## 2016-09-03 MED ORDER — MIDAZOLAM HCL 2 MG/2ML IJ SOLN
INTRAMUSCULAR | Status: DC | PRN
Start: 2016-09-03 — End: 2016-09-03
  Administered 2016-09-03: 2 mg via INTRAVENOUS

## 2016-09-03 MED ORDER — SUGAMMADEX SODIUM 200 MG/2ML IV SOLN
INTRAVENOUS | Status: DC | PRN
Start: 2016-09-03 — End: 2016-09-03
  Administered 2016-09-03: 170 mg via INTRAVENOUS

## 2016-09-03 MED ORDER — THROMBIN 5000 UNITS EX SOLR
CUTANEOUS | Status: DC | PRN
  Administered 2016-09-03 (×2): 5000 [IU] via TOPICAL

## 2016-09-03 MED ORDER — ROCURONIUM BROMIDE 100 MG/10ML IV SOLN
INTRAVENOUS | Status: DC | PRN
  Administered 2016-09-03: 40 mg via INTRAVENOUS

## 2016-09-03 MED ORDER — LIDOCAINE 2% (20 MG/ML) 5 ML SYRINGE
INTRAMUSCULAR | Status: DC | PRN
  Administered 2016-09-03: 80 mg via INTRAVENOUS

## 2016-09-03 MED ORDER — PHENYLEPHRINE 40 MCG/ML (10ML) SYRINGE FOR IV PUSH (FOR BLOOD PRESSURE SUPPORT)
PREFILLED_SYRINGE | INTRAVENOUS | Status: AC
  Filled 2016-09-03: qty 10

## 2016-09-03 MED ORDER — CHLORHEXIDINE GLUCONATE CLOTH 2 % EX PADS: 6.0000 | MEDICATED_PAD | Freq: Once | CUTANEOUS | Status: DC

## 2016-09-03 MED ORDER — CEFAZOLIN SODIUM-DEXTROSE 1-4 GM/50ML-% IV SOLN
1.0000 g | Freq: Three times a day (TID) | INTRAVENOUS | Status: AC
  Administered 2016-09-03 – 2016-09-04 (×2): 1 g via INTRAVENOUS
  Filled 2016-09-03 (×2): qty 50

## 2016-09-03 MED ORDER — LACTATED RINGERS IV SOLN
INTRAVENOUS | Status: DC | PRN
  Administered 2016-09-03: 13:00:00 via INTRAVENOUS

## 2016-09-03 MED ORDER — ALBUTEROL SULFATE (2.5 MG/3ML) 0.083% IN NEBU: 3.0000 mL | INHALATION_SOLUTION | Freq: Four times a day (QID) | RESPIRATORY_TRACT | Status: DC | PRN

## 2016-09-03 MED ORDER — ONDANSETRON HCL 4 MG PO TABS: 4.0000 mg | ORAL_TABLET | Freq: Four times a day (QID) | ORAL | Status: DC | PRN

## 2016-09-03 MED ORDER — FENTANYL CITRATE (PF) 100 MCG/2ML IJ SOLN
INTRAMUSCULAR | Status: DC | PRN
  Administered 2016-09-03: 100 ug via INTRAVENOUS
  Administered 2016-09-03: 50 ug via INTRAVENOUS
  Administered 2016-09-03: 100 ug via INTRAVENOUS

## 2016-09-03 MED ORDER — ATORVASTATIN CALCIUM 20 MG PO TABS
80.0000 mg | ORAL_TABLET | Freq: Every day | ORAL | Status: DC
  Administered 2016-09-04: 80 mg via ORAL
  Filled 2016-09-03: qty 4

## 2016-09-03 MED ORDER — SODIUM CHLORIDE 0.9% FLUSH
3.0000 mL | Freq: Two times a day (BID) | INTRAVENOUS | Status: DC
  Administered 2016-09-03: 3 mL via INTRAVENOUS

## 2016-09-03 MED ORDER — FENTANYL CITRATE (PF) 250 MCG/5ML IJ SOLN
INTRAMUSCULAR | Status: AC
  Filled 2016-09-03: qty 5

## 2016-09-03 MED ORDER — PROPOFOL 10 MG/ML IV BOLUS
INTRAVENOUS | Status: DC | PRN
  Administered 2016-09-03: 150 mg via INTRAVENOUS

## 2016-09-03 MED ORDER — ONDANSETRON HCL 4 MG/2ML IJ SOLN: 4.0000 mg | Freq: Four times a day (QID) | INTRAMUSCULAR | Status: DC | PRN

## 2016-09-03 MED ORDER — 0.9 % SODIUM CHLORIDE (POUR BTL) OPTIME
TOPICAL | Status: DC | PRN
  Administered 2016-09-03: 1000 mL

## 2016-09-03 MED ORDER — MIDAZOLAM HCL 2 MG/2ML IJ SOLN
INTRAMUSCULAR | Status: AC
  Filled 2016-09-03: qty 2

## 2016-09-03 MED ORDER — TOPIRAMATE 25 MG PO TABS
50.0000 mg | ORAL_TABLET | Freq: Two times a day (BID) | ORAL | Status: DC
  Administered 2016-09-03 – 2016-09-04 (×2): 50 mg via ORAL
  Filled 2016-09-03 (×3): qty 2

## 2016-09-03 MED ORDER — SUCCINYLCHOLINE CHLORIDE 200 MG/10ML IV SOSY
PREFILLED_SYRINGE | INTRAVENOUS | Status: DC | PRN
  Administered 2016-09-03: 100 mg via INTRAVENOUS

## 2016-09-03 MED ORDER — SODIUM CHLORIDE 0.9% FLUSH: 3.0000 mL | INTRAVENOUS | Status: DC | PRN

## 2016-09-03 MED ORDER — PHENOL 1.4 % MT LIQD: 1.0000 | OROMUCOSAL | Status: DC | PRN

## 2016-09-03 MED ORDER — LEVOTHYROXINE SODIUM 100 MCG PO TABS
200.0000 ug | ORAL_TABLET | Freq: Every day | ORAL | Status: DC
  Administered 2016-09-04: 200 ug via ORAL
  Filled 2016-09-03: qty 2

## 2016-09-03 MED ORDER — INSULIN ASPART 100 UNIT/ML ~~LOC~~ SOLN
0.0000 [IU] | Freq: Three times a day (TID) | SUBCUTANEOUS | Status: DC
  Administered 2016-09-04: 7 [IU] via SUBCUTANEOUS

## 2016-09-03 MED ORDER — GELATIN ABSORBABLE MT POWD
OROMUCOSAL | Status: DC | PRN
  Administered 2016-09-03: 14:00:00 via TOPICAL

## 2016-09-03 MED ORDER — HYDROCODONE-ACETAMINOPHEN 5-325 MG PO TABS
ORAL_TABLET | ORAL | Status: AC
  Filled 2016-09-03: qty 2

## 2016-09-03 MED ORDER — MENTHOL 3 MG MT LOZG: 1.0000 | LOZENGE | OROMUCOSAL | Status: DC | PRN

## 2016-09-03 MED ORDER — HYDROMORPHONE HCL 1 MG/ML IJ SOLN
0.5000 mg | INTRAMUSCULAR | Status: DC | PRN
  Administered 2016-09-03: 0.5 mg via INTRAVENOUS
  Filled 2016-09-03: qty 0.5

## 2016-09-03 MED ORDER — HEMOSTATIC AGENTS (NO CHARGE) OPTIME
TOPICAL | Status: DC | PRN
  Administered 2016-09-03: 1 via TOPICAL

## 2016-09-03 MED ORDER — CEFAZOLIN SODIUM-DEXTROSE 2-4 GM/100ML-% IV SOLN
2.0000 g | INTRAVENOUS | Status: AC
  Administered 2016-09-03: 2 g via INTRAVENOUS
  Filled 2016-09-03: qty 100

## 2016-09-03 MED ORDER — DEXTROSE 50 % IV SOLN: 12.5000 mL | Freq: Once | INTRAVENOUS | Status: DC

## 2016-09-03 MED ORDER — HYDROMORPHONE HCL 1 MG/ML IJ SOLN
0.2500 mg | INTRAMUSCULAR | Status: DC | PRN
  Administered 2016-09-03 (×3): 0.5 mg via INTRAVENOUS

## 2016-09-03 MED ORDER — INSULIN GLARGINE 100 UNIT/ML ~~LOC~~ SOLN
70.0000 [IU] | Freq: Every day | SUBCUTANEOUS | Status: DC
  Administered 2016-09-03: 70 [IU] via SUBCUTANEOUS
  Filled 2016-09-03 (×2): qty 0.7

## 2016-09-03 MED ORDER — SODIUM CHLORIDE 0.9 % IR SOLN
Status: DC | PRN
  Administered 2016-09-03: 13:00:00

## 2016-09-03 MED ORDER — ONDANSETRON HCL 4 MG/2ML IJ SOLN
INTRAMUSCULAR | Status: DC | PRN
  Administered 2016-09-03: 4 mg via INTRAVENOUS

## 2016-09-03 MED ORDER — PROPOFOL 10 MG/ML IV BOLUS
INTRAVENOUS | Status: AC
  Filled 2016-09-03: qty 20

## 2016-09-03 MED ORDER — PANTOPRAZOLE SODIUM 40 MG PO TBEC
40.0000 mg | DELAYED_RELEASE_TABLET | Freq: Two times a day (BID) | ORAL | Status: DC
  Administered 2016-09-03 – 2016-09-04 (×2): 40 mg via ORAL
  Filled 2016-09-03 (×2): qty 1

## 2016-09-03 MED ORDER — THROMBIN 5000 UNITS EX SOLR
CUTANEOUS | Status: AC
  Filled 2016-09-03: qty 5000

## 2016-09-03 SURGICAL SUPPLY — 60 items
BAG DECANTER FOR FLEXI CONT (MISCELLANEOUS) ×3 IMPLANT
BENZOIN TINCTURE PRP APPL 2/3 (GAUZE/BANDAGES/DRESSINGS) ×3 IMPLANT
BIT DRILL 13 (BIT) ×2 IMPLANT
BIT DRILL 13MM (BIT) ×1
BUR MATCHSTICK NEURO 3.0 LAGG (BURR) ×3 IMPLANT
CAGE PEEK 6X14X11 (Cage) ×2 IMPLANT
CANISTER SUCT 3000ML PPV (MISCELLANEOUS) ×3 IMPLANT
CARTRIDGE OIL MAESTRO DRILL (MISCELLANEOUS) ×1 IMPLANT
CLOSURE WOUND 1/2 X4 (GAUZE/BANDAGES/DRESSINGS) ×1
DERMABOND ADVANCED (GAUZE/BANDAGES/DRESSINGS) ×2
DERMABOND ADVANCED .7 DNX12 (GAUZE/BANDAGES/DRESSINGS) ×1 IMPLANT
DIFFUSER DRILL AIR PNEUMATIC (MISCELLANEOUS) ×3 IMPLANT
DRAPE C-ARM 42X72 X-RAY (DRAPES) ×6 IMPLANT
DRAPE LAPAROTOMY 100X72 PEDS (DRAPES) ×3 IMPLANT
DRAPE MICROSCOPE LEICA (MISCELLANEOUS) ×3 IMPLANT
DRAPE POUCH INSTRU U-SHP 10X18 (DRAPES) ×3 IMPLANT
DURAPREP 6ML APPLICATOR 50/CS (WOUND CARE) ×3 IMPLANT
ELECT COATED BLADE 2.86 ST (ELECTRODE) ×3 IMPLANT
ELECT REM PT RETURN 9FT ADLT (ELECTROSURGICAL) ×3
ELECTRODE REM PT RTRN 9FT ADLT (ELECTROSURGICAL) ×1 IMPLANT
GAUZE SPONGE 4X4 12PLY STRL (GAUZE/BANDAGES/DRESSINGS) ×3 IMPLANT
GAUZE SPONGE 4X4 16PLY XRAY LF (GAUZE/BANDAGES/DRESSINGS) IMPLANT
GLOVE ECLIPSE 7.5 STRL STRAW (GLOVE) ×9 IMPLANT
GLOVE ECLIPSE 9.0 STRL (GLOVE) ×3 IMPLANT
GLOVE EXAM NITRILE LRG STRL (GLOVE) IMPLANT
GLOVE EXAM NITRILE XL STR (GLOVE) IMPLANT
GLOVE EXAM NITRILE XS STR PU (GLOVE) IMPLANT
GLOVE INDICATOR 7.5 STRL GRN (GLOVE) ×6 IMPLANT
GOWN STRL REUS W/ TWL LRG LVL3 (GOWN DISPOSABLE) IMPLANT
GOWN STRL REUS W/ TWL XL LVL3 (GOWN DISPOSABLE) ×1 IMPLANT
GOWN STRL REUS W/TWL 2XL LVL3 (GOWN DISPOSABLE) ×3 IMPLANT
GOWN STRL REUS W/TWL LRG LVL3 (GOWN DISPOSABLE)
GOWN STRL REUS W/TWL XL LVL3 (GOWN DISPOSABLE) ×2
HALTER HD/CHIN CERV TRACTION D (MISCELLANEOUS) ×3 IMPLANT
HEMOSTAT POWDER SURGIFOAM 1G (HEMOSTASIS) ×3 IMPLANT
HEMOSTAT SURGICEL 2X14 (HEMOSTASIS) IMPLANT
KIT BASIN OR (CUSTOM PROCEDURE TRAY) ×3 IMPLANT
KIT ROOM TURNOVER OR (KITS) ×3 IMPLANT
NEEDLE SPNL 20GX3.5 QUINCKE YW (NEEDLE) ×3 IMPLANT
NS IRRIG 1000ML POUR BTL (IV SOLUTION) ×3 IMPLANT
OIL CARTRIDGE MAESTRO DRILL (MISCELLANEOUS) ×3
PACK LAMINECTOMY NEURO (CUSTOM PROCEDURE TRAY) ×3 IMPLANT
PAD ARMBOARD 7.5X6 YLW CONV (MISCELLANEOUS) ×9 IMPLANT
PEEK CAGE 7X14X11 (Cage) ×3 IMPLANT
PLATE VISION ELITE 40MM (Plate) ×3 IMPLANT
RUBBERBAND STERILE (MISCELLANEOUS) ×6 IMPLANT
SCREW ST 13X4XST VA NS SPNE (Screw) ×6 IMPLANT
SCREW ST VAR 4 ATL (Screw) ×12 IMPLANT
SPACER SPNL 11X14X6XPEEK CVD (Cage) ×1 IMPLANT
SPCR SPNL 11X14X6XPEEK CVD (Cage) ×1 IMPLANT
SPONGE INTESTINAL PEANUT (DISPOSABLE) ×3 IMPLANT
SPONGE SURGIFOAM ABS GEL SZ50 (HEMOSTASIS) ×3 IMPLANT
STRIP CLOSURE SKIN 1/2X4 (GAUZE/BANDAGES/DRESSINGS) ×2 IMPLANT
SUT VIC AB 3-0 SH 8-18 (SUTURE) ×3 IMPLANT
SUT VIC AB 4-0 RB1 18 (SUTURE) ×3 IMPLANT
TAPE CLOTH 4X10 WHT NS (GAUZE/BANDAGES/DRESSINGS) ×3 IMPLANT
TOWEL GREEN STERILE (TOWEL DISPOSABLE) ×3 IMPLANT
TOWEL GREEN STERILE FF (TOWEL DISPOSABLE) ×3 IMPLANT
TRAP SPECIMEN MUCOUS 40CC (MISCELLANEOUS) ×3 IMPLANT
WATER STERILE IRR 1000ML POUR (IV SOLUTION) ×3 IMPLANT

## 2016-09-03 NOTE — Progress Notes (Signed)
Patient's blood sugar 47 on arrival to short stay.   Dr. Orene Desanctis made aware of patient's blood sugar.  Per Dr. Orene Desanctis, no dextrose given and will recheck blood sugar at 1130.  IV was started on arrival.    Patient states that she took 70 units of Lantus in AM on 09/02/16 and 6 units of Humalog in evening on 09/02/16.    Diabetes coordinator had been notified of patient's surgery date and time.  DM coordinator called nurse due to patient's blood sugar on arrival.  Will continue to monitor patient.

## 2016-09-03 NOTE — Transfer of Care (Signed)
Immediate Anesthesia Transfer of Care Note  Patient: Emelia Loron  Procedure(s) Performed: Procedure(s): ANTERIOR CERVICAL DECOMPRESSION FUSION  - CERVICAL FOUR-FIVE - CERVICAL FIVE-SIX (N/A)  Patient Location: PACU  Anesthesia Type:General  Level of Consciousness: drowsy  Airway & Oxygen Therapy: Patient Spontanous Breathing and Patient connected to nasal cannula oxygen  Post-op Assessment: Report given to RN, Post -op Vital signs reviewed and stable and Patient moving all extremities  Post vital signs: Reviewed and stable  Last Vitals:  Vitals:   09/03/16 1057 09/03/16 1500  BP: (!) 159/80   Pulse: 73   Resp: 18   Temp: 36.9 C (P) 36.6 C    Last Pain: There were no vitals filed for this visit.       Complications: No apparent anesthesia complications

## 2016-09-03 NOTE — Progress Notes (Signed)
Report to Dr. Orene Desanctis of last CBG.

## 2016-09-03 NOTE — Progress Notes (Signed)
Report to Dr. Orene Desanctis & Larene [place] diabetes coordinator re: current CBG. Order rec'd fr. Dr. Orene Desanctis.

## 2016-09-03 NOTE — Brief Op Note (Signed)
09/03/2016  2:46 PM  PATIENT:  Kathleen Norris  51 y.o. female  PRE-OPERATIVE DIAGNOSIS:  Stenosis  POST-OPERATIVE DIAGNOSIS:  Stenosis  PROCEDURE:  Procedure(s): ANTERIOR CERVICAL DECOMPRESSION FUSION  - CERVICAL FOUR-FIVE - CERVICAL FIVE-SIX (N/A)  SURGEON:  Surgeon(s) and Role:    * Earnie Larsson, MD - Primary  PHYSICIAN ASSISTANT:   ASSISTANTS: [place]    ANESTHESIA:   general  EBL:  Total I/O In: 600 [I.V.:600] Out: -   BLOOD ADMINISTERED:none  DRAINS: none   LOCAL MEDICATIONS USED:  NONE  SPECIMEN:  No Specimen  DISPOSITION OF SPECIMEN:  N/A  COUNTS:  YES  TOURNIQUET:  * No tourniquets in log *  DICTATION: .Dragon Dictation  PLAN OF CARE: Admit for overnight observation  PATIENT DISPOSITION:  PACU - hemodynamically stable.   Delay start of Pharmacological VTE agent (>24hrs) due to surgical blood loss or risk of bleeding: yes

## 2016-09-03 NOTE — Op Note (Signed)
Date of procedure: 09/03/2016  Date of dictation: Same  Service: Neurosurgery  Preoperative diagnosis: C4-5, C5-6 spondylosis  Postoperative diagnosis: Same  Procedure Name: C4-5, C5-6 anterior cervical discectomy with interbody fusion utilizing interbody peek cages, locally harvested autograft, and anterior plate instrumentation  Surgeon:Deosha Werden A.Journei Thomassen, M.D.  Asst. Surgeon: Ronnald Ramp  Anesthesia: General  Indication: 51 year old female with chronic neck and upper extremity symptoms with associated headaches failing conservative management. Workup demonstrates evidence of progressive disc degeneration with associated spondylosis and foraminal stenosis at C4-5 and C5-6. Patient presents now for two-level anterior cervical discectomy and fusion in hopes of improving her symptoms.  Operative note: After induction of anesthesia, patient position supine with Extended and held place of halter traction. Patient's anterior cervical region prepped and draped sterilely. Incision made overlying C5. Dissection performed on the right. Retractor placed. Fluoroscopy used. Levels confirmed. Disks incised and discectomies performed using various instruments. Disc material removed down to level posterior annulus. Microscope brought field used throughout the remainder of the discectomy. Remaining aspects of annulus and osteophytes removed using high-speed drill down to level posterior lateral limb. Posterior longitudinal was then elevated and resected in piecemeal fashion. Underlying thecal sac was then identified. A wide central decompression and perform undercutting the bodies of C4 and C5. Decompression then proceeded H neural foramen. Wide anterior foraminotomies performed on course exiting C5 nerve roots bilaterally. At this point a very thorough decompression been achieved. There was no evidence of injury to thecal sac and nerve respect procedure then repeated at C5-6 again without complications. Wound is then  irrigated out like solution. [employer] anatomic peek cages were then packed with locally harvested autograft. Each cage was then impacted into place and recessed slightly from the anterior cortical margin. 40 mm Atlantis anterior cervical plate was then placed over the C4 to C6 levels. This an attachment or fluoroscopic guidance using 13 monitor very bundle screws to reach it all 3 levels. All 6 screws given a final tightening found be solidly within the bone. Locking screws engaged L3 levels. Final images revealed good position the bone graft and hardware at proper upper level with normal alignment of spine. Wounds and irrigated one final time. Hemostasis was assured. Wounds and close in layers with Vicryl sutures. Steri-Strips and sterile dressing were applied. No apparent complications. Patient tolerated procedure well and returns to the recovery room postop.

## 2016-09-03 NOTE — H&P (Signed)
Kathleen Norris is an 51 y.o. female.   Chief Complaint: Neck pain HPI: 51 year old female with neck pain and upper extremity symptoms failing conservative management. Workup demonstrates evidence of significant disc degeneration with broad-based disc bulging and foraminal stenosis at C4-5 and C5-6. Patient has failed conservative management presents now for two-level anterior cervical decompression and fusion.  Past Medical History:  Diagnosis Date  . Arthritis   . Asthma   . Chronic anxiety   . Chronic pain   . Diabetes mellitus    Diagnosed 1997  . Diabetes with ketoacidosis   . GERD (gastroesophageal reflux disease)   . Sinusitis   . SVT (supraventricular tachycardia) ([place])    Ablation per Dr. Lovena Le in November 2005  . URI (upper respiratory infection)     Past Surgical History:  Procedure Laterality Date  . LEFT HEART CATHETERIZATION WITH CORONARY ANGIOGRAM N/A 02/16/2011   Procedure: LEFT HEART CATHETERIZATION WITH CORONARY ANGIOGRAM;  Surgeon: Burnell Blanks, MD;  Location: [facility] CATH LAB;  Service: Cardiovascular;  Laterality: N/A;  . TUBAL LIGATION      Family History  Problem Relation Age of Onset  . Heart disease Maternal Uncle   . Diabetes Mother   . Cancer Mother    Social History:  reports that she has never smoked. She has never used smokeless tobacco. She reports that she does not drink alcohol or use drugs.  Allergies:  Allergies  Allergen Reactions  . No Known Allergies     Medications Prior to Admission  Medication Sig Dispense Refill  . ALPRAZolam (XANAX) 0.5 MG tablet TAKE ONE TABLET TWICE DAILY AS NEEDED (Patient taking differently: TAKE ONE TABLET THREE TIMES DAILY) 60 tablet 0  . aspirin 81 MG tablet Take 81 mg by mouth daily.     Marland Kitchen atorvastatin (LIPITOR) 80 MG tablet Take 80 mg by mouth daily.    . citalopram (CELEXA) 20 MG tablet Take 20 mg by mouth daily.    Marland Kitchen ibuprofen (ADVIL,MOTRIN) 200 MG tablet Take 400 mg by mouth every 6 (six)  hours as needed for mild pain or moderate pain.    Marland Kitchen insulin glargine (LANTUS) 100 UNIT/ML injection Inject 70 Units into the skin at bedtime. pcp-told pt. To take 70 units yesterday a.m. 09/02/2016- pt. Reports that it was done    . insulin lispro (HUMALOG) 100 UNIT/ML injection Inject 3-15 Units into the skin 3 (three) times daily before meals. Per sliding scale    . levothyroxine (SYNTHROID, LEVOTHROID) 200 MCG tablet Take 200 mcg by mouth daily before breakfast.    . Lidocaine 4 % PTCH Apply 1 patch topically daily as needed (pain).    . metFORMIN (GLUCOPHAGE) 500 MG tablet Take 500 mg by mouth 4 (four) times daily -  before meals and at bedtime.     . naproxen (NAPROSYN) 500 MG tablet Take 1 tablet (500 mg total) by mouth 2 (two) times daily with a meal. (Patient taking differently: Take 500 mg by mouth 3 (three) times daily with meals. ) 60 tablet 1  . naproxen sodium (ANAPROX) 220 MG tablet Take 220 mg by mouth 3 (three) times daily as needed (pain).    . pantoprazole (PROTONIX) 40 MG tablet Take 40 mg by mouth 2 (two) times daily.    Marland Kitchen topiramate (TOPAMAX) 50 MG tablet Take 50 mg by mouth 2 (two) times daily.    Marland Kitchen albuterol (PROVENTIL HFA;VENTOLIN HFA) 108 (90 BASE) MCG/ACT inhaler Inhale 2 puffs into the lungs every 6 (six)  hours as needed for wheezing. 1 Inhaler 2  . Blood Glucose Monitoring Suppl (BAYER CONTOUR MONITOR) W/DEVICE KIT by Does not apply route.    Marland Kitchen glucose blood (PRODIGY AUTOCODE TEST) test strip 1 each by Other route 3 (three) times daily. Use as instructed     . omeprazole (PRILOSEC) 40 MG capsule Take 1 capsule (40 mg total) by mouth daily. (Patient taking differently: Take 40 mg by mouth 2 (two) times daily. ) 30 capsule 4  . PRODIGY LANCETS 21G MISC by Does not apply route 3 (three) times daily.      . sitaGLIPtin (JANUVIA) 100 MG tablet Take 1 tablet (100 mg total) by mouth daily. (Patient not taking: Reported on 08/25/2016) 30 tablet 2    Results for orders placed or  performed during the hospital encounter of 09/03/16 (from the past 48 hour(s))  Glucose, capillary     Status: Abnormal   Collection Time: 09/03/16 11:00 AM  Result Value Ref Range   Glucose-Capillary 47 (L) 65 - 99 mg/dL  Glucose, capillary     Status: Abnormal   Collection Time: 09/03/16 11:33 AM  Result Value Ref Range   Glucose-Capillary 46 (L) 65 - 99 mg/dL  Glucose, capillary     Status: Abnormal   Collection Time: 09/03/16 12:23 PM  Result Value Ref Range   Glucose-Capillary 59 (L) 65 - 99 mg/dL   No results found.  Pertinent items noted in HPI and remainder of comprehensive ROS otherwise negative.  Blood pressure (!) 159/80, pulse 73, temperature 98.4 F (36.9 C), resp. rate 18, weight 82.1 kg (181 lb), SpO2 100 %.  Patient is awake and alert. She is oriented and appropriate. Speech is fluent. Judgment and insight are intact. Cranial nerve function normal. Motor examination of the extremities reveal some slight weakness of left wrist extensors otherwise motor strength intact. Sensory examination decreased sensation to light touch and pinprick left C6. Deep tenderness and normal active. No evidence of long track signs. Gait and posture normal. Examination head ears eyes nose and throat is unremarkable her cervical spine with midline airway. Carotid pulses normal bilaterally. Chest clear to auscultation. Heart regular in rhythm. Abdomen benign. Extremities free from injury deformity. Assessment/Plan Spondylosis with foraminal stenosis and radiculopathy. Plan C4-5, C5-6 anterior cervical discectomy and interbody fusion utilizing interbody peek cages, locally harvested autograft, and anterior Plate instrumentation. Risks and benefits explained. Patient wishes to proceed.    [name] A 09/03/2016, 12:35 PM

## 2016-09-03 NOTE — Anesthesia Procedure Notes (Signed)
Procedure Name: Intubation Date/Time: 09/03/2016 1:10 PM Performed by: Lavell Luster Pre-anesthesia Checklist: Patient identified, Emergency Drugs available, Suction available and Patient being monitored Patient Re-evaluated:Patient Re-evaluated prior to inductionOxygen Delivery Method: Circle System Utilized Preoxygenation: Pre-oxygenation with 100% oxygen Intubation Type: IV induction Ventilation: Mask ventilation without difficulty Laryngoscope Size: Mac and 3 Grade View: Grade I Tube type: Oral Tube size: 7.0 mm Number of attempts: 1 Airway Equipment and Method: Stylet and Oral airway Placement Confirmation: ETT inserted through vocal cords under direct vision,  positive ETCO2 and breath sounds checked- equal and bilateral Secured at: 21 cm Tube secured with: Tape Dental Injury: Teeth and Oropharynx as per pre-operative assessment

## 2016-09-03 NOTE — Anesthesia Preprocedure Evaluation (Addendum)
Anesthesia Evaluation  Patient identified by MRN, date of birth, ID band Patient awake    Reviewed: Allergy & Precautions, NPO status , Patient's Chart, lab work & pertinent test results  History of Anesthesia Complications Negative for: history of anesthetic complications  Airway Mallampati: II   Neck ROM: Full    Dental no notable dental hx.    Pulmonary asthma ,    breath sounds clear to auscultation       Cardiovascular hypertension,  Rhythm:Regular Rate:Normal     Neuro/Psych negative neurological ROS     GI/Hepatic GERD  ,  Endo/Other  diabetes, Poorly Controlled, Type 1  Renal/GU      Musculoskeletal  (+) Arthritis ,   Abdominal   Peds  Hematology negative hematology ROS (+)   Anesthesia Other Findings   Reproductive/Obstetrics                             Anesthesia Physical Anesthesia Plan  ASA: III  Anesthesia Plan: General   Post-op Pain Management:    Induction: Intravenous  PONV Risk Score and Plan: 4 or greater and Ondansetron, Dexamethasone, Propofol, Midazolam, Scopolamine patch - Pre-op and Treatment may vary due to age  Airway Management Planned: Oral ETT  Additional Equipment:   Intra-op Plan:   Post-operative Plan: Extubation in OR  Informed Consent: I have reviewed the patients History and Physical, chart, labs and discussed the procedure including the risks, benefits and alternatives for the proposed anesthesia with the patient or authorized representative who has indicated his/her understanding and acceptance.   Dental advisory given  Plan Discussed with: CRNA  Anesthesia Plan Comments:         Anesthesia Quick Evaluation

## 2016-09-04 ENCOUNTER — Encounter (HOSPITAL_COMMUNITY): Payer: Self-pay | Admitting: *Deleted

## 2016-09-04 DIAGNOSIS — Z8249 Family history of ischemic heart disease and other diseases of the circulatory system: Secondary | ICD-10-CM | POA: Diagnosis not present

## 2016-09-04 DIAGNOSIS — M50122 Cervical disc disorder at C5-C6 level with radiculopathy: Secondary | ICD-10-CM | POA: Diagnosis not present

## 2016-09-04 DIAGNOSIS — Z833 Family history of diabetes mellitus: Secondary | ICD-10-CM | POA: Diagnosis not present

## 2016-09-04 DIAGNOSIS — K219 Gastro-esophageal reflux disease without esophagitis: Secondary | ICD-10-CM | POA: Diagnosis not present

## 2016-09-04 DIAGNOSIS — Z794 Long term (current) use of insulin: Secondary | ICD-10-CM | POA: Diagnosis not present

## 2016-09-04 DIAGNOSIS — Z791 Long term (current) use of non-steroidal anti-inflammatories (NSAID): Secondary | ICD-10-CM | POA: Diagnosis not present

## 2016-09-04 DIAGNOSIS — G8929 Other chronic pain: Secondary | ICD-10-CM | POA: Diagnosis not present

## 2016-09-04 DIAGNOSIS — Z9889 Other specified postprocedural states: Secondary | ICD-10-CM | POA: Diagnosis not present

## 2016-09-04 DIAGNOSIS — M4722 Other spondylosis with radiculopathy, cervical region: Secondary | ICD-10-CM | POA: Diagnosis not present

## 2016-09-04 DIAGNOSIS — J45909 Unspecified asthma, uncomplicated: Secondary | ICD-10-CM | POA: Diagnosis not present

## 2016-09-04 DIAGNOSIS — E1065 Type 1 diabetes mellitus with hyperglycemia: Secondary | ICD-10-CM | POA: Diagnosis not present

## 2016-09-04 DIAGNOSIS — Z7982 Long term (current) use of aspirin: Secondary | ICD-10-CM | POA: Diagnosis not present

## 2016-09-04 DIAGNOSIS — M50121 Cervical disc disorder at C4-C5 level with radiculopathy: Secondary | ICD-10-CM | POA: Diagnosis not present

## 2016-09-04 DIAGNOSIS — I471 Supraventricular tachycardia: Secondary | ICD-10-CM | POA: Diagnosis not present

## 2016-09-04 DIAGNOSIS — M4802 Spinal stenosis, cervical region: Secondary | ICD-10-CM | POA: Diagnosis not present

## 2016-09-04 LAB — GLUCOSE, CAPILLARY: Glucose-Capillary: 220 mg/dL — ABNORMAL HIGH (ref 65–99)

## 2016-09-04 MED ORDER — HYDROCODONE-ACETAMINOPHEN 5-325 MG PO TABS: 1.0000 | ORAL_TABLET | ORAL | 0 refills | Status: DC | PRN

## 2016-09-04 MED ORDER — CYCLOBENZAPRINE HCL 10 MG PO TABS: 10.0000 mg | ORAL_TABLET | Freq: Three times a day (TID) | ORAL | 3 refills | Status: DC | PRN

## 2016-09-04 NOTE — Discharge Summary (Signed)
Physician Discharge Summary  Patient ID: Kathleen Norris MRN: [id number] DOB/AGE: [birth year] 51 y.o.  Admit date: 09/03/2016 Discharge date: 09/04/2016  Admission Diagnoses:Cervical spondylosis C4-5 C5-6 with cervical radiculopathy  Discharge Diagnoses: Cervical spondylosis C4-5 and C5-6 with cervical radiculopathy Active Problems:   Cervical spondylosis with radiculopathy   Discharged Condition: good  Hospital Course: Patient was admitted to undergo surgical decompression arthrodesis which she tolerated well.  Consults: None  Significant Diagnostic Studies: None  Treatments: surgery: Anterior cervical decompression C4-5 C5-6 arthrodesis with peek spacers and allograft anterior plate fixation [id number]  Discharge Exam: Blood pressure 118/71, pulse 79, temperature 97.8 F (36.6 C), resp. rate 18, weight 82.1 kg (181 lb), SpO2 100 %. Incision is clean and dry station and gait are intact.  Disposition: 01-Home or Self Care  Discharge Instructions    Call MD for:  redness, tenderness, or signs of infection (pain, swelling, redness, odor or green/yellow discharge around incision site)    Complete by:  As directed    Call MD for:  severe uncontrolled pain    Complete by:  As directed    Call MD for:  temperature >100.4    Complete by:  As directed    Diet - low sodium heart healthy    Complete by:  As directed    Increase activity slowly    Complete by:  As directed      Allergies as of 09/04/2016      Reactions   No Known Allergies       Medication List    TAKE these medications   albuterol 108 (90 Base) MCG/ACT inhaler Commonly known as:  PROVENTIL HFA;VENTOLIN HFA Inhale 2 puffs into the lungs every 6 (six) hours as needed for wheezing.   ALPRAZolam 0.5 MG tablet Commonly known as:  XANAX TAKE ONE TABLET TWICE DAILY AS NEEDED What changed:  See the new instructions.   aspirin 81 MG tablet Take 81 mg by mouth daily.   BAYER CONTOUR MONITOR w/Device Kit by Does not  apply route.   citalopram 20 MG tablet Commonly known as:  CELEXA Take 20 mg by mouth daily.   cyclobenzaprine 10 MG tablet Commonly known as:  FLEXERIL Take 1 tablet (10 mg total) by mouth 3 (three) times daily as needed for muscle spasms.   HYDROcodone-acetaminophen 5-325 MG tablet Commonly known as:  NORCO/VICODIN Take 1-2 tablets by mouth every 4 (four) hours as needed (breakthrough pain).   ibuprofen 200 MG tablet Commonly known as:  ADVIL,MOTRIN Take 400 mg by mouth every 6 (six) hours as needed for mild pain or moderate pain.   insulin glargine 100 UNIT/ML injection Commonly known as:  LANTUS Inject 70 Units into the skin at bedtime. pcp-told pt. To take 70 units yesterday a.m. 09/02/2016- pt. Reports that it was done   insulin lispro 100 UNIT/ML injection Commonly known as:  HUMALOG Inject 3-15 Units into the skin 3 (three) times daily before meals. Per sliding scale   levothyroxine 200 MCG tablet Commonly known as:  SYNTHROID, LEVOTHROID Take 200 mcg by mouth daily before breakfast.   Lidocaine 4 % Ptch Apply 1 patch topically daily as needed (pain).   LIPITOR 80 MG tablet Generic drug:  atorvastatin Take 80 mg by mouth daily.   metFORMIN 500 MG tablet Commonly known as:  GLUCOPHAGE Take 500 mg by mouth 4 (four) times daily -  before meals and at bedtime.   naproxen 500 MG tablet Commonly known as:  NAPROSYN Take 1 tablet (500  mg total) by mouth 2 (two) times daily with a meal. What changed:  when to take this   naproxen sodium 220 MG tablet Commonly known as:  ANAPROX Take 220 mg by mouth 3 (three) times daily as needed (pain).   omeprazole 40 MG capsule Commonly known as:  PRILOSEC Take 1 capsule (40 mg total) by mouth daily. What changed:  when to take this   pantoprazole 40 MG tablet Commonly known as:  PROTONIX Take 40 mg by mouth 2 (two) times daily.   PRODIGY AUTOCODE TEST test strip Generic drug:  glucose blood 1 each by Other route 3  (three) times daily. Use as instructed   PRODIGY LANCETS 21G Misc by Does not apply route 3 (three) times daily.   sitaGLIPtin 100 MG tablet Commonly known as:  JANUVIA Take 1 tablet (100 mg total) by mouth daily.   topiramate 50 MG tablet Commonly known as:  TOPAMAX Take 50 mg by mouth 2 (two) times daily.        SignedEarleen Newport 09/04/2016, 8:34 AM

## 2016-09-04 NOTE — Progress Notes (Signed)
Patient is dischraged from room 3C09 at this time. Alert and in stable condition. IV site d/c'd and instructions read to patient with understanding verbalized. Left unit via wheelchair with all belongings at side.

## 2016-09-06 ENCOUNTER — Encounter (HOSPITAL_COMMUNITY): Payer: Self-pay | Admitting: Neurosurgery

## 2016-09-20 NOTE — Anesthesia Postprocedure Evaluation (Signed)
Anesthesia Post Note  Patient: NARIYAH OSIAS  Procedure(s) Performed: Procedure(s) (LRB): ANTERIOR CERVICAL DECOMPRESSION FUSION  - CERVICAL FOUR-FIVE - CERVICAL FIVE-SIX (N/A)     Patient location during evaluation: PACU Anesthesia Type: General Level of consciousness: awake and alert Pain management: pain level controlled Vital Signs Assessment: post-procedure vital signs reviewed and stable Respiratory status: spontaneous breathing, nonlabored ventilation, respiratory function stable and patient connected to nasal cannula oxygen Cardiovascular status: blood pressure returned to baseline and stable Postop Assessment: no signs of nausea or vomiting Anesthetic complications: no    Last Vitals:  Vitals:   09/04/16 0411 09/04/16 0809  BP: 112/70 118/71  Pulse: 87 79  Resp: 18 18  Temp: 36.6 C 36.6 C    Last Pain:  Vitals:   09/04/16 0936  TempSrc:   PainSc: 4                  Jireh Elmore,[name]

## 2016-10-01 DIAGNOSIS — L03317 Cellulitis of buttock: Secondary | ICD-10-CM | POA: Diagnosis not present

## 2016-10-01 DIAGNOSIS — L0291 Cutaneous abscess, unspecified: Secondary | ICD-10-CM | POA: Diagnosis not present

## 2016-10-01 NOTE — Addendum Note (Signed)
Addendum  created 10/01/16 [id number] by Rica Koyanagi, MD   Sign clinical note

## 2016-10-01 NOTE — Anesthesia Postprocedure Evaluation (Signed)
Anesthesia Post Note  Patient: Kathleen Norris  Procedure(s) Performed: Procedure(s) (LRB): ANTERIOR CERVICAL DECOMPRESSION FUSION  - CERVICAL FOUR-FIVE - CERVICAL FIVE-SIX (N/A)     Patient location during evaluation: PACU Anesthesia Type: General Level of consciousness: awake and sedated Pain management: pain level controlled Vital Signs Assessment: post-procedure vital signs reviewed and stable Respiratory status: spontaneous breathing, nonlabored ventilation, respiratory function stable and patient connected to nasal cannula oxygen Cardiovascular status: blood pressure returned to baseline and stable Postop Assessment: no signs of nausea or vomiting Anesthetic complications: no    Last Vitals:  Vitals:   09/04/16 0411 09/04/16 0809  BP: 112/70 118/71  Pulse: 87 79  Resp: 18 18  Temp: 36.6 C 36.6 C    Last Pain:  Vitals:   09/04/16 0936  TempSrc:   PainSc: 4                  Teliyah Royal,[name]

## 2016-10-06 DIAGNOSIS — M5412 Radiculopathy, cervical region: Secondary | ICD-10-CM | POA: Diagnosis not present

## 2016-11-10 DIAGNOSIS — M5412 Radiculopathy, cervical region: Secondary | ICD-10-CM | POA: Diagnosis not present

## 2016-11-13 DIAGNOSIS — Z79899 Other long term (current) drug therapy: Secondary | ICD-10-CM | POA: Diagnosis not present

## 2016-11-13 DIAGNOSIS — E109 Type 1 diabetes mellitus without complications: Secondary | ICD-10-CM | POA: Diagnosis not present

## 2016-11-13 DIAGNOSIS — Z794 Long term (current) use of insulin: Secondary | ICD-10-CM | POA: Diagnosis not present

## 2016-11-13 DIAGNOSIS — L02214 Cutaneous abscess of groin: Secondary | ICD-10-CM | POA: Diagnosis not present

## 2016-11-13 DIAGNOSIS — E7800 Pure hypercholesterolemia, unspecified: Secondary | ICD-10-CM | POA: Diagnosis not present

## 2016-11-13 DIAGNOSIS — E039 Hypothyroidism, unspecified: Secondary | ICD-10-CM | POA: Diagnosis not present

## 2016-11-15 DIAGNOSIS — L0291 Cutaneous abscess, unspecified: Secondary | ICD-10-CM | POA: Diagnosis not present

## 2016-12-22 DIAGNOSIS — R030 Elevated blood-pressure reading, without diagnosis of hypertension: Secondary | ICD-10-CM | POA: Diagnosis not present

## 2016-12-22 DIAGNOSIS — M5412 Radiculopathy, cervical region: Secondary | ICD-10-CM | POA: Diagnosis not present

## 2017-01-02 DIAGNOSIS — E7800 Pure hypercholesterolemia, unspecified: Secondary | ICD-10-CM | POA: Diagnosis not present

## 2017-01-02 DIAGNOSIS — E162 Hypoglycemia, unspecified: Secondary | ICD-10-CM | POA: Diagnosis not present

## 2017-01-02 DIAGNOSIS — Z794 Long term (current) use of insulin: Secondary | ICD-10-CM | POA: Diagnosis not present

## 2017-01-02 DIAGNOSIS — R918 Other nonspecific abnormal finding of lung field: Secondary | ICD-10-CM | POA: Diagnosis not present

## 2017-01-02 DIAGNOSIS — E1065 Type 1 diabetes mellitus with hyperglycemia: Secondary | ICD-10-CM | POA: Diagnosis not present

## 2017-01-02 DIAGNOSIS — R404 Transient alteration of awareness: Secondary | ICD-10-CM | POA: Diagnosis not present

## 2017-01-02 DIAGNOSIS — E039 Hypothyroidism, unspecified: Secondary | ICD-10-CM | POA: Diagnosis not present

## 2017-01-02 DIAGNOSIS — D72829 Elevated white blood cell count, unspecified: Secondary | ICD-10-CM | POA: Diagnosis not present

## 2017-01-02 DIAGNOSIS — Z79899 Other long term (current) drug therapy: Secondary | ICD-10-CM | POA: Diagnosis not present

## 2017-01-02 DIAGNOSIS — R531 Weakness: Secondary | ICD-10-CM | POA: Diagnosis not present

## 2017-01-28 DIAGNOSIS — E039 Hypothyroidism, unspecified: Secondary | ICD-10-CM | POA: Diagnosis not present

## 2017-01-28 DIAGNOSIS — E782 Mixed hyperlipidemia: Secondary | ICD-10-CM | POA: Diagnosis not present

## 2017-01-28 DIAGNOSIS — Z9189 Other specified personal risk factors, not elsewhere classified: Secondary | ICD-10-CM | POA: Diagnosis not present

## 2017-01-28 DIAGNOSIS — E1165 Type 2 diabetes mellitus with hyperglycemia: Secondary | ICD-10-CM | POA: Diagnosis not present

## 2017-01-28 DIAGNOSIS — I10 Essential (primary) hypertension: Secondary | ICD-10-CM | POA: Diagnosis not present

## 2017-02-10 DIAGNOSIS — H1013 Acute atopic conjunctivitis, bilateral: Secondary | ICD-10-CM | POA: Diagnosis not present

## 2017-02-18 DIAGNOSIS — I10 Essential (primary) hypertension: Secondary | ICD-10-CM | POA: Diagnosis not present

## 2017-02-18 DIAGNOSIS — E782 Mixed hyperlipidemia: Secondary | ICD-10-CM | POA: Diagnosis not present

## 2017-02-18 DIAGNOSIS — E1065 Type 1 diabetes mellitus with hyperglycemia: Secondary | ICD-10-CM | POA: Diagnosis not present

## 2017-02-18 DIAGNOSIS — K219 Gastro-esophageal reflux disease without esophagitis: Secondary | ICD-10-CM | POA: Diagnosis not present

## 2017-02-18 DIAGNOSIS — E039 Hypothyroidism, unspecified: Secondary | ICD-10-CM | POA: Diagnosis not present

## 2017-02-18 DIAGNOSIS — Z23 Encounter for immunization: Secondary | ICD-10-CM | POA: Diagnosis not present

## 2017-03-25 ENCOUNTER — Ambulatory Visit: Payer: Self-pay | Admitting: "Endocrinology

## 2017-04-24 DIAGNOSIS — E1010 Type 1 diabetes mellitus with ketoacidosis without coma: Secondary | ICD-10-CM | POA: Diagnosis not present

## 2017-04-24 DIAGNOSIS — Z794 Long term (current) use of insulin: Secondary | ICD-10-CM | POA: Diagnosis not present

## 2017-04-24 DIAGNOSIS — Z825 Family history of asthma and other chronic lower respiratory diseases: Secondary | ICD-10-CM | POA: Diagnosis not present

## 2017-04-24 DIAGNOSIS — K8080 Other cholelithiasis without obstruction: Secondary | ICD-10-CM | POA: Diagnosis not present

## 2017-04-24 DIAGNOSIS — R739 Hyperglycemia, unspecified: Secondary | ICD-10-CM | POA: Diagnosis not present

## 2017-04-24 DIAGNOSIS — K2970 Gastritis, unspecified, without bleeding: Secondary | ICD-10-CM | POA: Diagnosis not present

## 2017-04-24 DIAGNOSIS — J4540 Moderate persistent asthma, uncomplicated: Secondary | ICD-10-CM | POA: Diagnosis not present

## 2017-04-24 DIAGNOSIS — R112 Nausea with vomiting, unspecified: Secondary | ICD-10-CM | POA: Diagnosis not present

## 2017-04-24 DIAGNOSIS — Z7951 Long term (current) use of inhaled steroids: Secondary | ICD-10-CM | POA: Diagnosis not present

## 2017-04-24 DIAGNOSIS — G43909 Migraine, unspecified, not intractable, without status migrainosus: Secondary | ICD-10-CM | POA: Diagnosis not present

## 2017-04-24 DIAGNOSIS — R1111 Vomiting without nausea: Secondary | ICD-10-CM | POA: Diagnosis not present

## 2017-04-24 DIAGNOSIS — Z7982 Long term (current) use of aspirin: Secondary | ICD-10-CM | POA: Diagnosis not present

## 2017-04-24 DIAGNOSIS — E1042 Type 1 diabetes mellitus with diabetic polyneuropathy: Secondary | ICD-10-CM | POA: Diagnosis not present

## 2017-04-24 DIAGNOSIS — K828 Other specified diseases of gallbladder: Secondary | ICD-10-CM | POA: Diagnosis not present

## 2017-04-24 DIAGNOSIS — Z79899 Other long term (current) drug therapy: Secondary | ICD-10-CM | POA: Diagnosis not present

## 2017-04-24 DIAGNOSIS — I252 Old myocardial infarction: Secondary | ICD-10-CM | POA: Diagnosis not present

## 2017-04-24 DIAGNOSIS — E785 Hyperlipidemia, unspecified: Secondary | ICD-10-CM | POA: Diagnosis not present

## 2017-04-24 DIAGNOSIS — Z833 Family history of diabetes mellitus: Secondary | ICD-10-CM | POA: Diagnosis not present

## 2017-04-24 DIAGNOSIS — E876 Hypokalemia: Secondary | ICD-10-CM | POA: Diagnosis not present

## 2017-04-24 DIAGNOSIS — K219 Gastro-esophageal reflux disease without esophagitis: Secondary | ICD-10-CM | POA: Diagnosis not present

## 2017-04-24 DIAGNOSIS — R1110 Vomiting, unspecified: Secondary | ICD-10-CM | POA: Diagnosis not present

## 2017-04-24 DIAGNOSIS — K811 Chronic cholecystitis: Secondary | ICD-10-CM | POA: Diagnosis not present

## 2017-04-24 DIAGNOSIS — E039 Hypothyroidism, unspecified: Secondary | ICD-10-CM | POA: Diagnosis not present

## 2017-04-24 DIAGNOSIS — K8000 Calculus of gallbladder with acute cholecystitis without obstruction: Secondary | ICD-10-CM | POA: Diagnosis not present

## 2017-04-24 DIAGNOSIS — K8013 Calculus of gallbladder with acute and chronic cholecystitis with obstruction: Secondary | ICD-10-CM | POA: Diagnosis not present

## 2017-04-24 DIAGNOSIS — Z800 Family history of malignant neoplasm of digestive organs: Secondary | ICD-10-CM | POA: Diagnosis not present

## 2017-04-24 DIAGNOSIS — E7800 Pure hypercholesterolemia, unspecified: Secondary | ICD-10-CM | POA: Diagnosis not present

## 2017-04-24 DIAGNOSIS — K8020 Calculus of gallbladder without cholecystitis without obstruction: Secondary | ICD-10-CM | POA: Diagnosis not present

## 2017-04-24 DIAGNOSIS — K8012 Calculus of gallbladder with acute and chronic cholecystitis without obstruction: Secondary | ICD-10-CM | POA: Diagnosis not present

## 2017-04-24 DIAGNOSIS — Z881 Allergy status to other antibiotic agents status: Secondary | ICD-10-CM | POA: Diagnosis not present

## 2017-04-28 DIAGNOSIS — Z7982 Long term (current) use of aspirin: Secondary | ICD-10-CM | POA: Diagnosis not present

## 2017-04-28 DIAGNOSIS — Z836 Family history of other diseases of the respiratory system: Secondary | ICD-10-CM | POA: Diagnosis not present

## 2017-04-28 DIAGNOSIS — R1084 Generalized abdominal pain: Secondary | ICD-10-CM | POA: Diagnosis not present

## 2017-04-28 DIAGNOSIS — Z981 Arthrodesis status: Secondary | ICD-10-CM | POA: Diagnosis not present

## 2017-04-28 DIAGNOSIS — Z794 Long term (current) use of insulin: Secondary | ICD-10-CM | POA: Diagnosis not present

## 2017-04-28 DIAGNOSIS — Z79899 Other long term (current) drug therapy: Secondary | ICD-10-CM | POA: Diagnosis not present

## 2017-04-28 DIAGNOSIS — E1142 Type 2 diabetes mellitus with diabetic polyneuropathy: Secondary | ICD-10-CM | POA: Diagnosis not present

## 2017-04-28 DIAGNOSIS — G8918 Other acute postprocedural pain: Secondary | ICD-10-CM | POA: Diagnosis not present

## 2017-04-28 DIAGNOSIS — Z881 Allergy status to other antibiotic agents status: Secondary | ICD-10-CM | POA: Diagnosis not present

## 2017-04-28 DIAGNOSIS — G43909 Migraine, unspecified, not intractable, without status migrainosus: Secondary | ICD-10-CM | POA: Diagnosis not present

## 2017-04-28 DIAGNOSIS — E785 Hyperlipidemia, unspecified: Secondary | ICD-10-CM | POA: Diagnosis not present

## 2017-04-28 DIAGNOSIS — R1010 Upper abdominal pain, unspecified: Secondary | ICD-10-CM | POA: Diagnosis not present

## 2017-04-28 DIAGNOSIS — E876 Hypokalemia: Secondary | ICD-10-CM | POA: Diagnosis not present

## 2017-04-28 DIAGNOSIS — E039 Hypothyroidism, unspecified: Secondary | ICD-10-CM | POA: Diagnosis not present

## 2017-04-28 DIAGNOSIS — K219 Gastro-esophageal reflux disease without esophagitis: Secondary | ICD-10-CM | POA: Diagnosis not present

## 2017-04-28 DIAGNOSIS — J4540 Moderate persistent asthma, uncomplicated: Secondary | ICD-10-CM | POA: Diagnosis not present

## 2017-04-28 DIAGNOSIS — I252 Old myocardial infarction: Secondary | ICD-10-CM | POA: Diagnosis not present

## 2017-04-28 DIAGNOSIS — Z800 Family history of malignant neoplasm of digestive organs: Secondary | ICD-10-CM | POA: Diagnosis not present

## 2017-04-28 DIAGNOSIS — Z9049 Acquired absence of other specified parts of digestive tract: Secondary | ICD-10-CM | POA: Diagnosis not present

## 2017-04-28 DIAGNOSIS — R945 Abnormal results of liver function studies: Secondary | ICD-10-CM | POA: Diagnosis not present

## 2017-04-28 DIAGNOSIS — Z7951 Long term (current) use of inhaled steroids: Secondary | ICD-10-CM | POA: Diagnosis not present

## 2017-04-29 DIAGNOSIS — R109 Unspecified abdominal pain: Secondary | ICD-10-CM | POA: Diagnosis not present

## 2017-04-29 DIAGNOSIS — R1011 Right upper quadrant pain: Secondary | ICD-10-CM | POA: Diagnosis not present

## 2017-05-10 DIAGNOSIS — E1010 Type 1 diabetes mellitus with ketoacidosis without coma: Secondary | ICD-10-CM | POA: Diagnosis not present

## 2017-05-10 DIAGNOSIS — R112 Nausea with vomiting, unspecified: Secondary | ICD-10-CM | POA: Diagnosis not present

## 2017-05-10 DIAGNOSIS — K2970 Gastritis, unspecified, without bleeding: Secondary | ICD-10-CM | POA: Diagnosis not present

## 2017-05-10 DIAGNOSIS — R140 Abdominal distension (gaseous): Secondary | ICD-10-CM | POA: Diagnosis not present

## 2017-05-11 DIAGNOSIS — J4540 Moderate persistent asthma, uncomplicated: Secondary | ICD-10-CM | POA: Diagnosis not present

## 2017-05-11 DIAGNOSIS — J181 Lobar pneumonia, unspecified organism: Secondary | ICD-10-CM | POA: Diagnosis not present

## 2017-05-11 DIAGNOSIS — E1042 Type 1 diabetes mellitus with diabetic polyneuropathy: Secondary | ICD-10-CM | POA: Diagnosis not present

## 2017-05-11 DIAGNOSIS — Z9049 Acquired absence of other specified parts of digestive tract: Secondary | ICD-10-CM | POA: Diagnosis not present

## 2017-05-11 DIAGNOSIS — Z7982 Long term (current) use of aspirin: Secondary | ICD-10-CM | POA: Diagnosis not present

## 2017-05-11 DIAGNOSIS — Z79899 Other long term (current) drug therapy: Secondary | ICD-10-CM | POA: Diagnosis not present

## 2017-05-11 DIAGNOSIS — G43909 Migraine, unspecified, not intractable, without status migrainosus: Secondary | ICD-10-CM | POA: Diagnosis not present

## 2017-05-11 DIAGNOSIS — Z881 Allergy status to other antibiotic agents status: Secondary | ICD-10-CM | POA: Diagnosis not present

## 2017-05-11 DIAGNOSIS — Z800 Family history of malignant neoplasm of digestive organs: Secondary | ICD-10-CM | POA: Diagnosis not present

## 2017-05-11 DIAGNOSIS — E1010 Type 1 diabetes mellitus with ketoacidosis without coma: Secondary | ICD-10-CM | POA: Diagnosis not present

## 2017-05-11 DIAGNOSIS — Z825 Family history of asthma and other chronic lower respiratory diseases: Secondary | ICD-10-CM | POA: Diagnosis not present

## 2017-05-11 DIAGNOSIS — J189 Pneumonia, unspecified organism: Secondary | ICD-10-CM | POA: Diagnosis not present

## 2017-05-11 DIAGNOSIS — K219 Gastro-esophageal reflux disease without esophagitis: Secondary | ICD-10-CM | POA: Diagnosis not present

## 2017-05-11 DIAGNOSIS — E039 Hypothyroidism, unspecified: Secondary | ICD-10-CM | POA: Diagnosis not present

## 2017-05-11 DIAGNOSIS — R1110 Vomiting, unspecified: Secondary | ICD-10-CM | POA: Diagnosis not present

## 2017-05-11 DIAGNOSIS — E785 Hyperlipidemia, unspecified: Secondary | ICD-10-CM | POA: Diagnosis not present

## 2017-05-11 DIAGNOSIS — Z794 Long term (current) use of insulin: Secondary | ICD-10-CM | POA: Diagnosis not present

## 2017-05-11 DIAGNOSIS — I252 Old myocardial infarction: Secondary | ICD-10-CM | POA: Diagnosis not present

## 2017-05-27 DIAGNOSIS — E1065 Type 1 diabetes mellitus with hyperglycemia: Secondary | ICD-10-CM | POA: Diagnosis not present

## 2017-05-27 DIAGNOSIS — J189 Pneumonia, unspecified organism: Secondary | ICD-10-CM | POA: Diagnosis not present

## 2017-05-31 DIAGNOSIS — K8020 Calculus of gallbladder without cholecystitis without obstruction: Secondary | ICD-10-CM | POA: Diagnosis not present

## 2017-06-09 DIAGNOSIS — R600 Localized edema: Secondary | ICD-10-CM | POA: Diagnosis not present

## 2017-06-09 DIAGNOSIS — L03314 Cellulitis of groin: Secondary | ICD-10-CM | POA: Diagnosis not present

## 2017-06-12 DIAGNOSIS — E1065 Type 1 diabetes mellitus with hyperglycemia: Secondary | ICD-10-CM | POA: Diagnosis not present

## 2017-06-12 DIAGNOSIS — J189 Pneumonia, unspecified organism: Secondary | ICD-10-CM | POA: Diagnosis not present

## 2017-06-15 DIAGNOSIS — Z794 Long term (current) use of insulin: Secondary | ICD-10-CM | POA: Diagnosis not present

## 2017-06-15 DIAGNOSIS — E10649 Type 1 diabetes mellitus with hypoglycemia without coma: Secondary | ICD-10-CM | POA: Diagnosis not present

## 2017-06-15 DIAGNOSIS — S20219A Contusion of unspecified front wall of thorax, initial encounter: Secondary | ICD-10-CM | POA: Diagnosis not present

## 2017-06-15 DIAGNOSIS — E7800 Pure hypercholesterolemia, unspecified: Secondary | ICD-10-CM | POA: Diagnosis not present

## 2017-06-15 DIAGNOSIS — R0781 Pleurodynia: Secondary | ICD-10-CM | POA: Diagnosis not present

## 2017-06-15 DIAGNOSIS — Z79899 Other long term (current) drug therapy: Secondary | ICD-10-CM | POA: Diagnosis not present

## 2017-06-15 DIAGNOSIS — S299XXA Unspecified injury of thorax, initial encounter: Secondary | ICD-10-CM | POA: Diagnosis not present

## 2017-06-15 DIAGNOSIS — R0789 Other chest pain: Secondary | ICD-10-CM | POA: Diagnosis not present

## 2017-06-15 DIAGNOSIS — S20212A Contusion of left front wall of thorax, initial encounter: Secondary | ICD-10-CM | POA: Diagnosis not present

## 2017-06-15 DIAGNOSIS — E039 Hypothyroidism, unspecified: Secondary | ICD-10-CM | POA: Diagnosis not present

## 2017-06-15 DIAGNOSIS — R55 Syncope and collapse: Secondary | ICD-10-CM | POA: Diagnosis not present

## 2017-06-19 DIAGNOSIS — J4540 Moderate persistent asthma, uncomplicated: Secondary | ICD-10-CM | POA: Diagnosis not present

## 2017-06-19 DIAGNOSIS — R197 Diarrhea, unspecified: Secondary | ICD-10-CM | POA: Diagnosis not present

## 2017-06-19 DIAGNOSIS — R42 Dizziness and giddiness: Secondary | ICD-10-CM | POA: Diagnosis not present

## 2017-06-19 DIAGNOSIS — E1042 Type 1 diabetes mellitus with diabetic polyneuropathy: Secondary | ICD-10-CM | POA: Diagnosis not present

## 2017-06-19 DIAGNOSIS — Z881 Allergy status to other antibiotic agents status: Secondary | ICD-10-CM | POA: Diagnosis not present

## 2017-06-19 DIAGNOSIS — E785 Hyperlipidemia, unspecified: Secondary | ICD-10-CM | POA: Diagnosis not present

## 2017-06-19 DIAGNOSIS — E1065 Type 1 diabetes mellitus with hyperglycemia: Secondary | ICD-10-CM | POA: Diagnosis not present

## 2017-06-19 DIAGNOSIS — Z833 Family history of diabetes mellitus: Secondary | ICD-10-CM | POA: Diagnosis not present

## 2017-06-19 DIAGNOSIS — R112 Nausea with vomiting, unspecified: Secondary | ICD-10-CM | POA: Diagnosis not present

## 2017-06-19 DIAGNOSIS — Z79899 Other long term (current) drug therapy: Secondary | ICD-10-CM | POA: Diagnosis not present

## 2017-06-19 DIAGNOSIS — A084 Viral intestinal infection, unspecified: Secondary | ICD-10-CM | POA: Diagnosis not present

## 2017-06-19 DIAGNOSIS — Z800 Family history of malignant neoplasm of digestive organs: Secondary | ICD-10-CM | POA: Diagnosis not present

## 2017-06-19 DIAGNOSIS — E1010 Type 1 diabetes mellitus with ketoacidosis without coma: Secondary | ICD-10-CM | POA: Diagnosis not present

## 2017-06-19 DIAGNOSIS — Z794 Long term (current) use of insulin: Secondary | ICD-10-CM | POA: Diagnosis not present

## 2017-06-19 DIAGNOSIS — R404 Transient alteration of awareness: Secondary | ICD-10-CM | POA: Diagnosis not present

## 2017-06-19 DIAGNOSIS — R748 Abnormal levels of other serum enzymes: Secondary | ICD-10-CM | POA: Diagnosis not present

## 2017-06-19 DIAGNOSIS — Z7982 Long term (current) use of aspirin: Secondary | ICD-10-CM | POA: Diagnosis not present

## 2017-06-19 DIAGNOSIS — E860 Dehydration: Secondary | ICD-10-CM | POA: Diagnosis not present

## 2017-06-19 DIAGNOSIS — K219 Gastro-esophageal reflux disease without esophagitis: Secondary | ICD-10-CM | POA: Diagnosis not present

## 2017-06-19 DIAGNOSIS — Z8249 Family history of ischemic heart disease and other diseases of the circulatory system: Secondary | ICD-10-CM | POA: Diagnosis not present

## 2017-06-19 DIAGNOSIS — Z825 Family history of asthma and other chronic lower respiratory diseases: Secondary | ICD-10-CM | POA: Diagnosis not present

## 2017-06-19 DIAGNOSIS — R1111 Vomiting without nausea: Secondary | ICD-10-CM | POA: Diagnosis not present

## 2017-06-19 DIAGNOSIS — D72819 Decreased white blood cell count, unspecified: Secondary | ICD-10-CM | POA: Diagnosis not present

## 2017-06-19 DIAGNOSIS — G43909 Migraine, unspecified, not intractable, without status migrainosus: Secondary | ICD-10-CM | POA: Diagnosis not present

## 2017-06-19 DIAGNOSIS — E039 Hypothyroidism, unspecified: Secondary | ICD-10-CM | POA: Diagnosis not present

## 2017-07-28 DIAGNOSIS — Z1211 Encounter for screening for malignant neoplasm of colon: Secondary | ICD-10-CM | POA: Diagnosis not present

## 2017-07-28 DIAGNOSIS — E785 Hyperlipidemia, unspecified: Secondary | ICD-10-CM | POA: Diagnosis not present

## 2017-07-28 DIAGNOSIS — Z881 Allergy status to other antibiotic agents status: Secondary | ICD-10-CM | POA: Diagnosis not present

## 2017-07-28 DIAGNOSIS — Z79899 Other long term (current) drug therapy: Secondary | ICD-10-CM | POA: Diagnosis not present

## 2017-07-28 DIAGNOSIS — E119 Type 2 diabetes mellitus without complications: Secondary | ICD-10-CM | POA: Diagnosis not present

## 2017-07-28 DIAGNOSIS — Z794 Long term (current) use of insulin: Secondary | ICD-10-CM | POA: Diagnosis not present

## 2017-07-28 DIAGNOSIS — K219 Gastro-esophageal reflux disease without esophagitis: Secondary | ICD-10-CM | POA: Diagnosis not present

## 2017-07-28 DIAGNOSIS — E039 Hypothyroidism, unspecified: Secondary | ICD-10-CM | POA: Diagnosis not present

## 2017-07-28 DIAGNOSIS — J45909 Unspecified asthma, uncomplicated: Secondary | ICD-10-CM | POA: Diagnosis not present

## 2017-07-28 DIAGNOSIS — E1142 Type 2 diabetes mellitus with diabetic polyneuropathy: Secondary | ICD-10-CM | POA: Diagnosis not present

## 2017-07-28 DIAGNOSIS — Z9049 Acquired absence of other specified parts of digestive tract: Secondary | ICD-10-CM | POA: Diagnosis not present

## 2017-07-28 DIAGNOSIS — G43909 Migraine, unspecified, not intractable, without status migrainosus: Secondary | ICD-10-CM | POA: Diagnosis not present

## 2017-07-28 DIAGNOSIS — Z800 Family history of malignant neoplasm of digestive organs: Secondary | ICD-10-CM | POA: Diagnosis not present

## 2017-08-01 DIAGNOSIS — L03314 Cellulitis of groin: Secondary | ICD-10-CM | POA: Diagnosis not present

## 2017-09-01 DIAGNOSIS — E039 Hypothyroidism, unspecified: Secondary | ICD-10-CM | POA: Diagnosis not present

## 2017-09-01 DIAGNOSIS — I10 Essential (primary) hypertension: Secondary | ICD-10-CM | POA: Diagnosis not present

## 2017-09-01 DIAGNOSIS — E1065 Type 1 diabetes mellitus with hyperglycemia: Secondary | ICD-10-CM | POA: Diagnosis not present

## 2017-09-01 DIAGNOSIS — E782 Mixed hyperlipidemia: Secondary | ICD-10-CM | POA: Diagnosis not present

## 2017-09-01 DIAGNOSIS — G43919 Migraine, unspecified, intractable, without status migrainosus: Secondary | ICD-10-CM | POA: Diagnosis not present

## 2017-09-14 DIAGNOSIS — L0390 Cellulitis, unspecified: Secondary | ICD-10-CM | POA: Diagnosis not present

## 2017-09-14 DIAGNOSIS — L0291 Cutaneous abscess, unspecified: Secondary | ICD-10-CM | POA: Diagnosis not present

## 2017-09-23 DIAGNOSIS — E782 Mixed hyperlipidemia: Secondary | ICD-10-CM | POA: Diagnosis not present

## 2017-09-23 DIAGNOSIS — E039 Hypothyroidism, unspecified: Secondary | ICD-10-CM | POA: Diagnosis not present

## 2017-09-23 DIAGNOSIS — E1065 Type 1 diabetes mellitus with hyperglycemia: Secondary | ICD-10-CM | POA: Diagnosis not present

## 2017-10-10 DIAGNOSIS — L03116 Cellulitis of left lower limb: Secondary | ICD-10-CM | POA: Diagnosis not present

## 2017-10-11 DIAGNOSIS — L03116 Cellulitis of left lower limb: Secondary | ICD-10-CM | POA: Diagnosis not present

## 2017-10-11 DIAGNOSIS — L03311 Cellulitis of abdominal wall: Secondary | ICD-10-CM | POA: Diagnosis not present

## 2017-11-08 DIAGNOSIS — E1165 Type 2 diabetes mellitus with hyperglycemia: Secondary | ICD-10-CM | POA: Diagnosis not present

## 2017-11-08 DIAGNOSIS — E860 Dehydration: Secondary | ICD-10-CM | POA: Diagnosis not present

## 2017-11-08 DIAGNOSIS — R110 Nausea: Secondary | ICD-10-CM | POA: Diagnosis not present

## 2017-11-08 DIAGNOSIS — R112 Nausea with vomiting, unspecified: Secondary | ICD-10-CM | POA: Diagnosis not present

## 2017-11-08 DIAGNOSIS — E1010 Type 1 diabetes mellitus with ketoacidosis without coma: Secondary | ICD-10-CM | POA: Diagnosis not present

## 2017-11-08 DIAGNOSIS — R0689 Other abnormalities of breathing: Secondary | ICD-10-CM | POA: Diagnosis not present

## 2017-11-09 DIAGNOSIS — Z79899 Other long term (current) drug therapy: Secondary | ICD-10-CM | POA: Diagnosis not present

## 2017-11-09 DIAGNOSIS — J4540 Moderate persistent asthma, uncomplicated: Secondary | ICD-10-CM | POA: Diagnosis not present

## 2017-11-09 DIAGNOSIS — E785 Hyperlipidemia, unspecified: Secondary | ICD-10-CM | POA: Diagnosis not present

## 2017-11-09 DIAGNOSIS — Z881 Allergy status to other antibiotic agents status: Secondary | ICD-10-CM | POA: Diagnosis not present

## 2017-11-09 DIAGNOSIS — M545 Low back pain: Secondary | ICD-10-CM | POA: Diagnosis not present

## 2017-11-09 DIAGNOSIS — Z794 Long term (current) use of insulin: Secondary | ICD-10-CM | POA: Diagnosis not present

## 2017-11-09 DIAGNOSIS — K219 Gastro-esophageal reflux disease without esophagitis: Secondary | ICD-10-CM | POA: Diagnosis not present

## 2017-11-09 DIAGNOSIS — Z800 Family history of malignant neoplasm of digestive organs: Secondary | ICD-10-CM | POA: Diagnosis not present

## 2017-11-09 DIAGNOSIS — L03317 Cellulitis of buttock: Secondary | ICD-10-CM | POA: Diagnosis not present

## 2017-11-09 DIAGNOSIS — E876 Hypokalemia: Secondary | ICD-10-CM | POA: Diagnosis not present

## 2017-11-09 DIAGNOSIS — I10 Essential (primary) hypertension: Secondary | ICD-10-CM | POA: Diagnosis not present

## 2017-11-09 DIAGNOSIS — E1042 Type 1 diabetes mellitus with diabetic polyneuropathy: Secondary | ICD-10-CM | POA: Diagnosis not present

## 2017-11-09 DIAGNOSIS — E1010 Type 1 diabetes mellitus with ketoacidosis without coma: Secondary | ICD-10-CM | POA: Diagnosis not present

## 2017-11-09 DIAGNOSIS — E039 Hypothyroidism, unspecified: Secondary | ICD-10-CM | POA: Diagnosis not present

## 2017-11-09 DIAGNOSIS — L0231 Cutaneous abscess of buttock: Secondary | ICD-10-CM | POA: Diagnosis not present

## 2017-11-09 DIAGNOSIS — R110 Nausea: Secondary | ICD-10-CM | POA: Diagnosis not present

## 2017-11-09 DIAGNOSIS — G43909 Migraine, unspecified, not intractable, without status migrainosus: Secondary | ICD-10-CM | POA: Diagnosis not present

## 2017-11-09 DIAGNOSIS — E1140 Type 2 diabetes mellitus with diabetic neuropathy, unspecified: Secondary | ICD-10-CM | POA: Diagnosis not present

## 2017-12-12 DIAGNOSIS — L03317 Cellulitis of buttock: Secondary | ICD-10-CM | POA: Diagnosis not present

## 2017-12-12 DIAGNOSIS — E1010 Type 1 diabetes mellitus with ketoacidosis without coma: Secondary | ICD-10-CM | POA: Diagnosis not present

## 2017-12-16 DIAGNOSIS — E109 Type 1 diabetes mellitus without complications: Secondary | ICD-10-CM | POA: Diagnosis not present

## 2017-12-16 DIAGNOSIS — E039 Hypothyroidism, unspecified: Secondary | ICD-10-CM | POA: Diagnosis not present

## 2017-12-16 DIAGNOSIS — L0231 Cutaneous abscess of buttock: Secondary | ICD-10-CM | POA: Diagnosis not present

## 2017-12-16 DIAGNOSIS — Z794 Long term (current) use of insulin: Secondary | ICD-10-CM | POA: Diagnosis not present

## 2017-12-16 DIAGNOSIS — K219 Gastro-esophageal reflux disease without esophagitis: Secondary | ICD-10-CM | POA: Diagnosis not present

## 2017-12-16 DIAGNOSIS — L03317 Cellulitis of buttock: Secondary | ICD-10-CM | POA: Diagnosis not present

## 2017-12-16 DIAGNOSIS — E7800 Pure hypercholesterolemia, unspecified: Secondary | ICD-10-CM | POA: Diagnosis not present

## 2017-12-16 DIAGNOSIS — Z79899 Other long term (current) drug therapy: Secondary | ICD-10-CM | POA: Diagnosis not present

## 2017-12-20 DIAGNOSIS — L0231 Cutaneous abscess of buttock: Secondary | ICD-10-CM | POA: Diagnosis not present

## 2017-12-20 DIAGNOSIS — R739 Hyperglycemia, unspecified: Secondary | ICD-10-CM | POA: Diagnosis not present

## 2017-12-20 DIAGNOSIS — E1165 Type 2 diabetes mellitus with hyperglycemia: Secondary | ICD-10-CM | POA: Diagnosis not present

## 2017-12-24 DIAGNOSIS — E860 Dehydration: Secondary | ICD-10-CM | POA: Diagnosis not present

## 2017-12-24 DIAGNOSIS — D72829 Elevated white blood cell count, unspecified: Secondary | ICD-10-CM | POA: Diagnosis not present

## 2017-12-24 DIAGNOSIS — E1042 Type 1 diabetes mellitus with diabetic polyneuropathy: Secondary | ICD-10-CM | POA: Diagnosis not present

## 2017-12-24 DIAGNOSIS — G43909 Migraine, unspecified, not intractable, without status migrainosus: Secondary | ICD-10-CM | POA: Diagnosis not present

## 2017-12-24 DIAGNOSIS — L0231 Cutaneous abscess of buttock: Secondary | ICD-10-CM | POA: Diagnosis not present

## 2017-12-24 DIAGNOSIS — Z23 Encounter for immunization: Secondary | ICD-10-CM | POA: Diagnosis not present

## 2017-12-24 DIAGNOSIS — R1111 Vomiting without nausea: Secondary | ICD-10-CM | POA: Diagnosis not present

## 2017-12-24 DIAGNOSIS — E039 Hypothyroidism, unspecified: Secondary | ICD-10-CM | POA: Diagnosis not present

## 2017-12-24 DIAGNOSIS — E1310 Other specified diabetes mellitus with ketoacidosis without coma: Secondary | ICD-10-CM | POA: Diagnosis not present

## 2017-12-24 DIAGNOSIS — E876 Hypokalemia: Secondary | ICD-10-CM | POA: Diagnosis not present

## 2017-12-24 DIAGNOSIS — Z79899 Other long term (current) drug therapy: Secondary | ICD-10-CM | POA: Diagnosis not present

## 2017-12-24 DIAGNOSIS — K219 Gastro-esophageal reflux disease without esophagitis: Secondary | ICD-10-CM | POA: Diagnosis not present

## 2017-12-24 DIAGNOSIS — B958 Unspecified staphylococcus as the cause of diseases classified elsewhere: Secondary | ICD-10-CM | POA: Diagnosis not present

## 2017-12-24 DIAGNOSIS — J4540 Moderate persistent asthma, uncomplicated: Secondary | ICD-10-CM | POA: Diagnosis not present

## 2017-12-24 DIAGNOSIS — Z800 Family history of malignant neoplasm of digestive organs: Secondary | ICD-10-CM | POA: Diagnosis not present

## 2017-12-24 DIAGNOSIS — L03317 Cellulitis of buttock: Secondary | ICD-10-CM | POA: Diagnosis not present

## 2017-12-24 DIAGNOSIS — Z833 Family history of diabetes mellitus: Secondary | ICD-10-CM | POA: Diagnosis not present

## 2017-12-24 DIAGNOSIS — Z794 Long term (current) use of insulin: Secondary | ICD-10-CM | POA: Diagnosis not present

## 2017-12-24 DIAGNOSIS — Z8249 Family history of ischemic heart disease and other diseases of the circulatory system: Secondary | ICD-10-CM | POA: Diagnosis not present

## 2017-12-24 DIAGNOSIS — E1010 Type 1 diabetes mellitus with ketoacidosis without coma: Secondary | ICD-10-CM | POA: Diagnosis not present

## 2017-12-24 DIAGNOSIS — E785 Hyperlipidemia, unspecified: Secondary | ICD-10-CM | POA: Diagnosis not present

## 2017-12-24 DIAGNOSIS — M5030 Other cervical disc degeneration, unspecified cervical region: Secondary | ICD-10-CM | POA: Diagnosis not present

## 2017-12-27 DIAGNOSIS — Z23 Encounter for immunization: Secondary | ICD-10-CM | POA: Diagnosis not present

## 2018-01-03 DIAGNOSIS — E1065 Type 1 diabetes mellitus with hyperglycemia: Secondary | ICD-10-CM | POA: Diagnosis not present

## 2018-01-03 DIAGNOSIS — Z7689 Persons encountering health services in other specified circumstances: Secondary | ICD-10-CM | POA: Diagnosis not present

## 2018-01-21 DIAGNOSIS — E1042 Type 1 diabetes mellitus with diabetic polyneuropathy: Secondary | ICD-10-CM | POA: Diagnosis not present

## 2018-01-21 DIAGNOSIS — K219 Gastro-esophageal reflux disease without esophagitis: Secondary | ICD-10-CM | POA: Diagnosis not present

## 2018-01-21 DIAGNOSIS — E785 Hyperlipidemia, unspecified: Secondary | ICD-10-CM | POA: Diagnosis not present

## 2018-01-21 DIAGNOSIS — B349 Viral infection, unspecified: Secondary | ICD-10-CM | POA: Diagnosis not present

## 2018-01-21 DIAGNOSIS — G43909 Migraine, unspecified, not intractable, without status migrainosus: Secondary | ICD-10-CM | POA: Diagnosis not present

## 2018-01-21 DIAGNOSIS — J4540 Moderate persistent asthma, uncomplicated: Secondary | ICD-10-CM | POA: Diagnosis not present

## 2018-01-21 DIAGNOSIS — E1010 Type 1 diabetes mellitus with ketoacidosis without coma: Secondary | ICD-10-CM | POA: Diagnosis not present

## 2018-01-21 DIAGNOSIS — R1013 Epigastric pain: Secondary | ICD-10-CM | POA: Diagnosis not present

## 2018-01-21 DIAGNOSIS — E876 Hypokalemia: Secondary | ICD-10-CM | POA: Diagnosis not present

## 2018-01-21 DIAGNOSIS — Z79899 Other long term (current) drug therapy: Secondary | ICD-10-CM | POA: Diagnosis not present

## 2018-01-21 DIAGNOSIS — R9431 Abnormal electrocardiogram [ECG] [EKG]: Secondary | ICD-10-CM | POA: Diagnosis not present

## 2018-01-21 DIAGNOSIS — E039 Hypothyroidism, unspecified: Secondary | ICD-10-CM | POA: Diagnosis not present

## 2018-01-21 DIAGNOSIS — Z794 Long term (current) use of insulin: Secondary | ICD-10-CM | POA: Diagnosis not present

## 2018-01-24 DIAGNOSIS — J0101 Acute recurrent maxillary sinusitis: Secondary | ICD-10-CM | POA: Diagnosis not present

## 2018-01-26 DIAGNOSIS — E1065 Type 1 diabetes mellitus with hyperglycemia: Secondary | ICD-10-CM | POA: Diagnosis not present

## 2018-01-26 DIAGNOSIS — L03317 Cellulitis of buttock: Secondary | ICD-10-CM | POA: Diagnosis not present

## 2018-02-22 DIAGNOSIS — L0231 Cutaneous abscess of buttock: Secondary | ICD-10-CM | POA: Diagnosis not present

## 2018-02-22 DIAGNOSIS — L03317 Cellulitis of buttock: Secondary | ICD-10-CM | POA: Diagnosis not present

## 2018-02-25 DIAGNOSIS — L0231 Cutaneous abscess of buttock: Secondary | ICD-10-CM | POA: Diagnosis not present

## 2018-03-02 ENCOUNTER — Other Ambulatory Visit: Payer: Self-pay

## 2018-03-02 NOTE — Patient Outreach (Signed)
[facility]) Care Management  03/02/2018  INFANTOF VILLAGOMEZ 1965/09/29 [id number]   Medication Adherence call to Mrs. Julyanna Gras left a message for patient to call back patient is due on Atorvastatin 80 mg. Mrs. Bayliss is showing past due under [place].   [place] Management Direct Dial [phone]  Fax [phone] [email or web address]

## 2018-06-02 DIAGNOSIS — I10 Essential (primary) hypertension: Secondary | ICD-10-CM | POA: Diagnosis not present

## 2018-06-02 DIAGNOSIS — Z0001 Encounter for general adult medical examination with abnormal findings: Secondary | ICD-10-CM | POA: Diagnosis not present

## 2018-06-02 DIAGNOSIS — E039 Hypothyroidism, unspecified: Secondary | ICD-10-CM | POA: Diagnosis not present

## 2018-06-02 DIAGNOSIS — E1065 Type 1 diabetes mellitus with hyperglycemia: Secondary | ICD-10-CM | POA: Diagnosis not present

## 2018-06-02 DIAGNOSIS — E782 Mixed hyperlipidemia: Secondary | ICD-10-CM | POA: Diagnosis not present

## 2018-06-02 DIAGNOSIS — Z23 Encounter for immunization: Secondary | ICD-10-CM | POA: Diagnosis not present

## 2018-07-14 ENCOUNTER — Other Ambulatory Visit: Payer: Self-pay

## 2018-07-14 ENCOUNTER — Encounter: Payer: Self-pay | Admitting: Internal Medicine

## 2018-07-14 ENCOUNTER — Encounter: Payer: Medicare Other | Admitting: Internal Medicine

## 2018-08-01 ENCOUNTER — Telehealth: Payer: Self-pay

## 2018-08-01 NOTE — Telephone Encounter (Signed)
NS for NP appt with Dr. Cruzita Lederer on 07/14/18. Called to reschedule appt. LVM requesting returned call.

## 2018-08-18 ENCOUNTER — Encounter: Payer: Self-pay | Admitting: Internal Medicine

## 2018-09-03 IMAGING — RF DG C-ARM 61-120 MIN
1 series · 1 of 1 positions shown · non-contrast
Comparison: None.

FLUOROSCOPY TIME:  0 minutes 3 seconds; 1 acquired image

CLINICAL DATA: Anterior fusion

EXAM:
CERVICAL SPINE 1 VIEW

[Series 1: run · 1 of 1 slices shown]
[im 1/1]
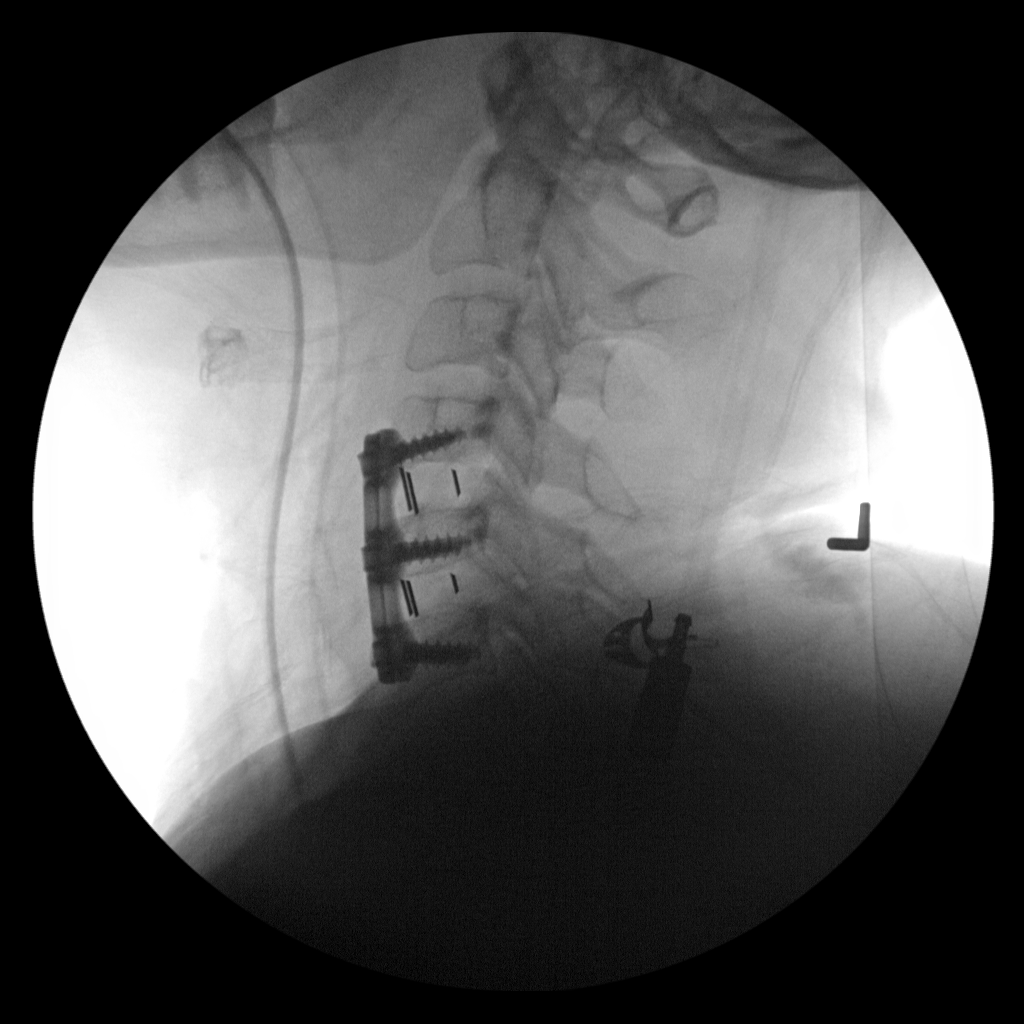

[1 of 1 positions shown; findings below may reference images not displayed]

FINDINGS: Cross-table lateral cervical spine image obtained. There is anterior
screw and plate fixation at C4, C5, and C6 with disc spacers at C4-5
and C5-6. Support hardware appears normal. No fracture or
spondylolisthesis. Disc spaces appear unremarkable.
IMPRESSION: Anterior screw and plate fixation at C4, C5, and C6 with disc
spacers at C4-5 and C5-6. Support hardware intact. No fracture or
spondylolisthesis. Disc spaces appear unremarkable.

## 2018-09-06 DIAGNOSIS — I10 Essential (primary) hypertension: Secondary | ICD-10-CM | POA: Diagnosis not present

## 2018-09-06 DIAGNOSIS — E782 Mixed hyperlipidemia: Secondary | ICD-10-CM | POA: Diagnosis not present

## 2018-09-06 DIAGNOSIS — Z1389 Encounter for screening for other disorder: Secondary | ICD-10-CM | POA: Diagnosis not present

## 2018-09-06 DIAGNOSIS — E039 Hypothyroidism, unspecified: Secondary | ICD-10-CM | POA: Diagnosis not present

## 2018-09-06 DIAGNOSIS — J4520 Mild intermittent asthma, uncomplicated: Secondary | ICD-10-CM | POA: Diagnosis not present

## 2018-09-06 DIAGNOSIS — E1142 Type 2 diabetes mellitus with diabetic polyneuropathy: Secondary | ICD-10-CM | POA: Diagnosis not present

## 2018-09-06 DIAGNOSIS — E1065 Type 1 diabetes mellitus with hyperglycemia: Secondary | ICD-10-CM | POA: Diagnosis not present

## 2018-10-27 DIAGNOSIS — E7800 Pure hypercholesterolemia, unspecified: Secondary | ICD-10-CM | POA: Diagnosis not present

## 2018-10-27 DIAGNOSIS — I10 Essential (primary) hypertension: Secondary | ICD-10-CM | POA: Diagnosis not present

## 2018-10-27 DIAGNOSIS — E1165 Type 2 diabetes mellitus with hyperglycemia: Secondary | ICD-10-CM | POA: Diagnosis not present

## 2018-11-09 DIAGNOSIS — M79641 Pain in right hand: Secondary | ICD-10-CM | POA: Diagnosis not present

## 2018-11-09 DIAGNOSIS — J301 Allergic rhinitis due to pollen: Secondary | ICD-10-CM | POA: Diagnosis not present

## 2018-11-27 DIAGNOSIS — E039 Hypothyroidism, unspecified: Secondary | ICD-10-CM | POA: Diagnosis not present

## 2018-11-27 DIAGNOSIS — E782 Mixed hyperlipidemia: Secondary | ICD-10-CM | POA: Diagnosis not present

## 2018-12-06 DIAGNOSIS — E1065 Type 1 diabetes mellitus with hyperglycemia: Secondary | ICD-10-CM | POA: Diagnosis not present

## 2018-12-06 DIAGNOSIS — Z23 Encounter for immunization: Secondary | ICD-10-CM | POA: Diagnosis not present

## 2018-12-06 DIAGNOSIS — I10 Essential (primary) hypertension: Secondary | ICD-10-CM | POA: Diagnosis not present

## 2018-12-06 DIAGNOSIS — E039 Hypothyroidism, unspecified: Secondary | ICD-10-CM | POA: Diagnosis not present

## 2018-12-06 DIAGNOSIS — J4520 Mild intermittent asthma, uncomplicated: Secondary | ICD-10-CM | POA: Diagnosis not present

## 2018-12-06 DIAGNOSIS — G252 Other specified forms of tremor: Secondary | ICD-10-CM | POA: Diagnosis not present

## 2018-12-06 DIAGNOSIS — E782 Mixed hyperlipidemia: Secondary | ICD-10-CM | POA: Diagnosis not present

## 2018-12-12 DIAGNOSIS — M65311 Trigger thumb, right thumb: Secondary | ICD-10-CM | POA: Diagnosis not present

## 2018-12-27 DIAGNOSIS — I10 Essential (primary) hypertension: Secondary | ICD-10-CM | POA: Diagnosis not present

## 2018-12-27 DIAGNOSIS — E782 Mixed hyperlipidemia: Secondary | ICD-10-CM | POA: Diagnosis not present

## 2018-12-27 DIAGNOSIS — E039 Hypothyroidism, unspecified: Secondary | ICD-10-CM | POA: Diagnosis not present

## 2019-01-09 DIAGNOSIS — E1310 Other specified diabetes mellitus with ketoacidosis without coma: Secondary | ICD-10-CM | POA: Diagnosis not present

## 2019-01-09 DIAGNOSIS — Z981 Arthrodesis status: Secondary | ICD-10-CM | POA: Diagnosis not present

## 2019-01-09 DIAGNOSIS — K219 Gastro-esophageal reflux disease without esophagitis: Secondary | ICD-10-CM | POA: Diagnosis not present

## 2019-01-09 DIAGNOSIS — E785 Hyperlipidemia, unspecified: Secondary | ICD-10-CM | POA: Diagnosis not present

## 2019-01-09 DIAGNOSIS — E1042 Type 1 diabetes mellitus with diabetic polyneuropathy: Secondary | ICD-10-CM | POA: Diagnosis not present

## 2019-01-09 DIAGNOSIS — R0689 Other abnormalities of breathing: Secondary | ICD-10-CM | POA: Diagnosis not present

## 2019-01-09 DIAGNOSIS — R0789 Other chest pain: Secondary | ICD-10-CM | POA: Diagnosis not present

## 2019-01-09 DIAGNOSIS — E1010 Type 1 diabetes mellitus with ketoacidosis without coma: Secondary | ICD-10-CM | POA: Diagnosis not present

## 2019-01-09 DIAGNOSIS — Z833 Family history of diabetes mellitus: Secondary | ICD-10-CM | POA: Diagnosis not present

## 2019-01-09 DIAGNOSIS — J4540 Moderate persistent asthma, uncomplicated: Secondary | ICD-10-CM | POA: Diagnosis not present

## 2019-01-09 DIAGNOSIS — G4489 Other headache syndrome: Secondary | ICD-10-CM | POA: Diagnosis not present

## 2019-01-09 DIAGNOSIS — I213 ST elevation (STEMI) myocardial infarction of unspecified site: Secondary | ICD-10-CM | POA: Diagnosis not present

## 2019-01-09 DIAGNOSIS — G43909 Migraine, unspecified, not intractable, without status migrainosus: Secondary | ICD-10-CM | POA: Diagnosis not present

## 2019-01-09 DIAGNOSIS — Z800 Family history of malignant neoplasm of digestive organs: Secondary | ICD-10-CM | POA: Diagnosis not present

## 2019-01-09 DIAGNOSIS — R079 Chest pain, unspecified: Secondary | ICD-10-CM | POA: Diagnosis not present

## 2019-01-09 DIAGNOSIS — E039 Hypothyroidism, unspecified: Secondary | ICD-10-CM | POA: Diagnosis not present

## 2019-01-09 DIAGNOSIS — Z794 Long term (current) use of insulin: Secondary | ICD-10-CM | POA: Diagnosis not present

## 2019-01-09 DIAGNOSIS — E876 Hypokalemia: Secondary | ICD-10-CM | POA: Diagnosis not present

## 2019-01-09 DIAGNOSIS — E1165 Type 2 diabetes mellitus with hyperglycemia: Secondary | ICD-10-CM | POA: Diagnosis not present

## 2019-01-26 DIAGNOSIS — I10 Essential (primary) hypertension: Secondary | ICD-10-CM | POA: Diagnosis not present

## 2019-01-26 DIAGNOSIS — E782 Mixed hyperlipidemia: Secondary | ICD-10-CM | POA: Diagnosis not present

## 2019-02-10 DIAGNOSIS — E1110 Type 2 diabetes mellitus with ketoacidosis without coma: Secondary | ICD-10-CM | POA: Diagnosis not present

## 2019-02-26 DIAGNOSIS — E1165 Type 2 diabetes mellitus with hyperglycemia: Secondary | ICD-10-CM | POA: Diagnosis not present

## 2019-02-26 DIAGNOSIS — E782 Mixed hyperlipidemia: Secondary | ICD-10-CM | POA: Diagnosis not present

## 2019-03-06 DIAGNOSIS — M65311 Trigger thumb, right thumb: Secondary | ICD-10-CM | POA: Diagnosis not present

## 2019-03-29 DIAGNOSIS — E782 Mixed hyperlipidemia: Secondary | ICD-10-CM | POA: Diagnosis not present

## 2019-03-29 DIAGNOSIS — M65311 Trigger thumb, right thumb: Secondary | ICD-10-CM | POA: Diagnosis not present

## 2019-03-29 DIAGNOSIS — I10 Essential (primary) hypertension: Secondary | ICD-10-CM | POA: Diagnosis not present

## 2019-04-13 DIAGNOSIS — M65311 Trigger thumb, right thumb: Secondary | ICD-10-CM | POA: Diagnosis not present

## 2019-05-14 DIAGNOSIS — E1010 Type 1 diabetes mellitus with ketoacidosis without coma: Secondary | ICD-10-CM | POA: Diagnosis not present

## 2019-05-14 DIAGNOSIS — I10 Essential (primary) hypertension: Secondary | ICD-10-CM | POA: Diagnosis not present

## 2019-05-14 DIAGNOSIS — J301 Allergic rhinitis due to pollen: Secondary | ICD-10-CM | POA: Diagnosis not present

## 2019-05-14 DIAGNOSIS — Z20822 Contact with and (suspected) exposure to covid-19: Secondary | ICD-10-CM | POA: Diagnosis not present

## 2019-05-14 DIAGNOSIS — E1165 Type 2 diabetes mellitus with hyperglycemia: Secondary | ICD-10-CM | POA: Diagnosis not present

## 2019-05-14 DIAGNOSIS — E1065 Type 1 diabetes mellitus with hyperglycemia: Secondary | ICD-10-CM | POA: Diagnosis not present

## 2019-05-14 DIAGNOSIS — R1111 Vomiting without nausea: Secondary | ICD-10-CM | POA: Diagnosis not present

## 2019-05-14 DIAGNOSIS — R112 Nausea with vomiting, unspecified: Secondary | ICD-10-CM | POA: Diagnosis not present

## 2019-05-14 DIAGNOSIS — K219 Gastro-esophageal reflux disease without esophagitis: Secondary | ICD-10-CM | POA: Diagnosis not present

## 2019-05-14 DIAGNOSIS — F3340 Major depressive disorder, recurrent, in remission, unspecified: Secondary | ICD-10-CM | POA: Diagnosis not present

## 2019-05-14 DIAGNOSIS — R739 Hyperglycemia, unspecified: Secondary | ICD-10-CM | POA: Diagnosis not present

## 2019-05-14 DIAGNOSIS — J4540 Moderate persistent asthma, uncomplicated: Secondary | ICD-10-CM | POA: Diagnosis not present

## 2019-05-14 DIAGNOSIS — R110 Nausea: Secondary | ICD-10-CM | POA: Diagnosis not present

## 2019-05-14 DIAGNOSIS — Z79899 Other long term (current) drug therapy: Secondary | ICD-10-CM | POA: Diagnosis not present

## 2019-05-14 DIAGNOSIS — E871 Hypo-osmolality and hyponatremia: Secondary | ICD-10-CM | POA: Diagnosis not present

## 2019-05-14 DIAGNOSIS — E785 Hyperlipidemia, unspecified: Secondary | ICD-10-CM | POA: Diagnosis not present

## 2019-05-15 DIAGNOSIS — E1010 Type 1 diabetes mellitus with ketoacidosis without coma: Secondary | ICD-10-CM | POA: Diagnosis not present

## 2019-05-29 DIAGNOSIS — F410 Panic disorder [episodic paroxysmal anxiety] without agoraphobia: Secondary | ICD-10-CM | POA: Diagnosis not present

## 2019-05-29 DIAGNOSIS — E1142 Type 2 diabetes mellitus with diabetic polyneuropathy: Secondary | ICD-10-CM | POA: Diagnosis not present

## 2019-05-29 DIAGNOSIS — E782 Mixed hyperlipidemia: Secondary | ICD-10-CM | POA: Diagnosis not present

## 2019-05-29 DIAGNOSIS — I10 Essential (primary) hypertension: Secondary | ICD-10-CM | POA: Diagnosis not present

## 2019-05-29 DIAGNOSIS — G43919 Migraine, unspecified, intractable, without status migrainosus: Secondary | ICD-10-CM | POA: Diagnosis not present

## 2019-05-29 DIAGNOSIS — E1065 Type 1 diabetes mellitus with hyperglycemia: Secondary | ICD-10-CM | POA: Diagnosis not present

## 2019-05-29 DIAGNOSIS — J4520 Mild intermittent asthma, uncomplicated: Secondary | ICD-10-CM | POA: Diagnosis not present

## 2019-06-14 DIAGNOSIS — E1110 Type 2 diabetes mellitus with ketoacidosis without coma: Secondary | ICD-10-CM | POA: Diagnosis not present

## 2019-06-27 DIAGNOSIS — I10 Essential (primary) hypertension: Secondary | ICD-10-CM | POA: Diagnosis not present

## 2019-06-27 DIAGNOSIS — E7849 Other hyperlipidemia: Secondary | ICD-10-CM | POA: Diagnosis not present

## 2019-07-27 DIAGNOSIS — E7849 Other hyperlipidemia: Secondary | ICD-10-CM | POA: Diagnosis not present

## 2019-07-27 DIAGNOSIS — E039 Hypothyroidism, unspecified: Secondary | ICD-10-CM | POA: Diagnosis not present

## 2019-08-30 ENCOUNTER — Telehealth: Payer: Self-pay | Admitting: "Endocrinology

## 2019-08-30 NOTE — Telephone Encounter (Signed)
Dr Dorris Fetch I received a referral on this pt, she has no showed 3 new pt appts in the past. Do you want me to schedule?

## 2019-08-30 NOTE — Telephone Encounter (Signed)
Give her 1 more chance.

## 2019-09-25 DIAGNOSIS — E1065 Type 1 diabetes mellitus with hyperglycemia: Secondary | ICD-10-CM | POA: Diagnosis not present

## 2019-09-25 DIAGNOSIS — E7849 Other hyperlipidemia: Secondary | ICD-10-CM | POA: Diagnosis not present

## 2019-09-25 DIAGNOSIS — Z794 Long term (current) use of insulin: Secondary | ICD-10-CM | POA: Diagnosis not present

## 2019-09-25 DIAGNOSIS — I10 Essential (primary) hypertension: Secondary | ICD-10-CM | POA: Diagnosis not present

## 2019-10-23 DIAGNOSIS — E039 Hypothyroidism, unspecified: Secondary | ICD-10-CM | POA: Diagnosis not present

## 2019-10-23 DIAGNOSIS — Z794 Long term (current) use of insulin: Secondary | ICD-10-CM | POA: Diagnosis not present

## 2019-10-23 DIAGNOSIS — R569 Unspecified convulsions: Secondary | ICD-10-CM | POA: Diagnosis not present

## 2019-10-23 DIAGNOSIS — F329 Major depressive disorder, single episode, unspecified: Secondary | ICD-10-CM | POA: Diagnosis not present

## 2019-10-23 DIAGNOSIS — E161 Other hypoglycemia: Secondary | ICD-10-CM | POA: Diagnosis not present

## 2019-10-23 DIAGNOSIS — E785 Hyperlipidemia, unspecified: Secondary | ICD-10-CM | POA: Diagnosis not present

## 2019-10-23 DIAGNOSIS — K219 Gastro-esophageal reflux disease without esophagitis: Secondary | ICD-10-CM | POA: Diagnosis not present

## 2019-10-23 DIAGNOSIS — R404 Transient alteration of awareness: Secondary | ICD-10-CM | POA: Diagnosis not present

## 2019-10-23 DIAGNOSIS — E10649 Type 1 diabetes mellitus with hypoglycemia without coma: Secondary | ICD-10-CM | POA: Diagnosis not present

## 2019-10-23 DIAGNOSIS — E162 Hypoglycemia, unspecified: Secondary | ICD-10-CM | POA: Diagnosis not present

## 2019-10-23 DIAGNOSIS — R456 Violent behavior: Secondary | ICD-10-CM | POA: Diagnosis not present

## 2019-10-23 DIAGNOSIS — J45909 Unspecified asthma, uncomplicated: Secondary | ICD-10-CM | POA: Diagnosis not present

## 2019-10-23 DIAGNOSIS — Z79899 Other long term (current) drug therapy: Secondary | ICD-10-CM | POA: Diagnosis not present

## 2019-10-26 DIAGNOSIS — E7849 Other hyperlipidemia: Secondary | ICD-10-CM | POA: Diagnosis not present

## 2019-10-26 DIAGNOSIS — I10 Essential (primary) hypertension: Secondary | ICD-10-CM | POA: Diagnosis not present

## 2019-10-26 DIAGNOSIS — Z794 Long term (current) use of insulin: Secondary | ICD-10-CM | POA: Diagnosis not present

## 2019-10-26 DIAGNOSIS — E1065 Type 1 diabetes mellitus with hyperglycemia: Secondary | ICD-10-CM | POA: Diagnosis not present

## 2019-11-07 DIAGNOSIS — K219 Gastro-esophageal reflux disease without esophagitis: Secondary | ICD-10-CM | POA: Diagnosis not present

## 2019-11-07 DIAGNOSIS — R4582 Worries: Secondary | ICD-10-CM | POA: Diagnosis not present

## 2019-11-07 DIAGNOSIS — Z0001 Encounter for general adult medical examination with abnormal findings: Secondary | ICD-10-CM | POA: Diagnosis not present

## 2019-11-07 DIAGNOSIS — Z1331 Encounter for screening for depression: Secondary | ICD-10-CM | POA: Diagnosis not present

## 2019-11-07 DIAGNOSIS — G43919 Migraine, unspecified, intractable, without status migrainosus: Secondary | ICD-10-CM | POA: Diagnosis not present

## 2019-11-07 DIAGNOSIS — Z1389 Encounter for screening for other disorder: Secondary | ICD-10-CM | POA: Diagnosis not present

## 2019-11-07 DIAGNOSIS — E039 Hypothyroidism, unspecified: Secondary | ICD-10-CM | POA: Diagnosis not present

## 2019-11-07 DIAGNOSIS — J301 Allergic rhinitis due to pollen: Secondary | ICD-10-CM | POA: Diagnosis not present

## 2019-11-07 DIAGNOSIS — E782 Mixed hyperlipidemia: Secondary | ICD-10-CM | POA: Diagnosis not present

## 2019-11-07 DIAGNOSIS — F331 Major depressive disorder, recurrent, moderate: Secondary | ICD-10-CM | POA: Diagnosis not present

## 2019-11-07 DIAGNOSIS — E1065 Type 1 diabetes mellitus with hyperglycemia: Secondary | ICD-10-CM | POA: Diagnosis not present

## 2019-11-07 DIAGNOSIS — I10 Essential (primary) hypertension: Secondary | ICD-10-CM | POA: Diagnosis not present

## 2019-11-07 DIAGNOSIS — F410 Panic disorder [episodic paroxysmal anxiety] without agoraphobia: Secondary | ICD-10-CM | POA: Diagnosis not present

## 2019-11-07 DIAGNOSIS — J4520 Mild intermittent asthma, uncomplicated: Secondary | ICD-10-CM | POA: Diagnosis not present

## 2019-11-14 ENCOUNTER — Ambulatory Visit: Payer: Medicaid Other | Admitting: "Endocrinology

## 2019-11-22 DIAGNOSIS — Z23 Encounter for immunization: Secondary | ICD-10-CM | POA: Diagnosis not present

## 2019-11-27 DIAGNOSIS — I10 Essential (primary) hypertension: Secondary | ICD-10-CM | POA: Diagnosis not present

## 2019-11-27 DIAGNOSIS — Z794 Long term (current) use of insulin: Secondary | ICD-10-CM | POA: Diagnosis not present

## 2019-11-27 DIAGNOSIS — E7849 Other hyperlipidemia: Secondary | ICD-10-CM | POA: Diagnosis not present

## 2019-11-27 DIAGNOSIS — E1065 Type 1 diabetes mellitus with hyperglycemia: Secondary | ICD-10-CM | POA: Diagnosis not present

## 2019-12-15 DIAGNOSIS — E875 Hyperkalemia: Secondary | ICD-10-CM | POA: Diagnosis not present

## 2019-12-15 DIAGNOSIS — K219 Gastro-esophageal reflux disease without esophagitis: Secondary | ICD-10-CM | POA: Diagnosis not present

## 2019-12-15 DIAGNOSIS — E1010 Type 1 diabetes mellitus with ketoacidosis without coma: Secondary | ICD-10-CM | POA: Diagnosis not present

## 2019-12-15 DIAGNOSIS — E782 Mixed hyperlipidemia: Secondary | ICD-10-CM | POA: Diagnosis not present

## 2019-12-15 DIAGNOSIS — R1110 Vomiting, unspecified: Secondary | ICD-10-CM | POA: Diagnosis not present

## 2019-12-15 DIAGNOSIS — N179 Acute kidney failure, unspecified: Secondary | ICD-10-CM | POA: Diagnosis not present

## 2019-12-15 DIAGNOSIS — E1110 Type 2 diabetes mellitus with ketoacidosis without coma: Secondary | ICD-10-CM | POA: Diagnosis not present

## 2019-12-15 DIAGNOSIS — S299XXA Unspecified injury of thorax, initial encounter: Secondary | ICD-10-CM | POA: Diagnosis not present

## 2019-12-15 DIAGNOSIS — Z794 Long term (current) use of insulin: Secondary | ICD-10-CM | POA: Diagnosis not present

## 2019-12-15 DIAGNOSIS — S3992XA Unspecified injury of lower back, initial encounter: Secondary | ICD-10-CM | POA: Diagnosis not present

## 2019-12-15 DIAGNOSIS — E1042 Type 1 diabetes mellitus with diabetic polyneuropathy: Secondary | ICD-10-CM | POA: Diagnosis not present

## 2019-12-15 DIAGNOSIS — I700 Atherosclerosis of aorta: Secondary | ICD-10-CM | POA: Diagnosis not present

## 2019-12-15 DIAGNOSIS — K429 Umbilical hernia without obstruction or gangrene: Secondary | ICD-10-CM | POA: Diagnosis not present

## 2019-12-15 DIAGNOSIS — E039 Hypothyroidism, unspecified: Secondary | ICD-10-CM | POA: Diagnosis not present

## 2019-12-15 DIAGNOSIS — E1169 Type 2 diabetes mellitus with other specified complication: Secondary | ICD-10-CM | POA: Diagnosis not present

## 2019-12-15 DIAGNOSIS — J4540 Moderate persistent asthma, uncomplicated: Secondary | ICD-10-CM | POA: Diagnosis not present

## 2019-12-21 DIAGNOSIS — Z6829 Body mass index (BMI) 29.0-29.9, adult: Secondary | ICD-10-CM | POA: Diagnosis not present

## 2019-12-21 DIAGNOSIS — E1110 Type 2 diabetes mellitus with ketoacidosis without coma: Secondary | ICD-10-CM | POA: Diagnosis not present

## 2019-12-21 DIAGNOSIS — Z23 Encounter for immunization: Secondary | ICD-10-CM | POA: Diagnosis not present

## 2019-12-27 DIAGNOSIS — I10 Essential (primary) hypertension: Secondary | ICD-10-CM | POA: Diagnosis not present

## 2019-12-27 DIAGNOSIS — Z794 Long term (current) use of insulin: Secondary | ICD-10-CM | POA: Diagnosis not present

## 2019-12-27 DIAGNOSIS — E7849 Other hyperlipidemia: Secondary | ICD-10-CM | POA: Diagnosis not present

## 2019-12-27 DIAGNOSIS — E1065 Type 1 diabetes mellitus with hyperglycemia: Secondary | ICD-10-CM | POA: Diagnosis not present

## 2020-01-18 DIAGNOSIS — E1110 Type 2 diabetes mellitus with ketoacidosis without coma: Secondary | ICD-10-CM | POA: Diagnosis not present

## 2020-01-18 DIAGNOSIS — B850 Pediculosis due to Pediculus humanus capitis: Secondary | ICD-10-CM | POA: Diagnosis not present

## 2020-01-26 DIAGNOSIS — I10 Essential (primary) hypertension: Secondary | ICD-10-CM | POA: Diagnosis not present

## 2020-01-26 DIAGNOSIS — E7849 Other hyperlipidemia: Secondary | ICD-10-CM | POA: Diagnosis not present

## 2020-01-26 DIAGNOSIS — Z794 Long term (current) use of insulin: Secondary | ICD-10-CM | POA: Diagnosis not present

## 2020-01-26 DIAGNOSIS — E1065 Type 1 diabetes mellitus with hyperglycemia: Secondary | ICD-10-CM | POA: Diagnosis not present

## 2020-02-02 DIAGNOSIS — I249 Acute ischemic heart disease, unspecified: Secondary | ICD-10-CM | POA: Diagnosis not present

## 2020-02-02 DIAGNOSIS — E1165 Type 2 diabetes mellitus with hyperglycemia: Secondary | ICD-10-CM | POA: Diagnosis not present

## 2020-02-02 DIAGNOSIS — Z20822 Contact with and (suspected) exposure to covid-19: Secondary | ICD-10-CM | POA: Diagnosis not present

## 2020-02-02 DIAGNOSIS — R0689 Other abnormalities of breathing: Secondary | ICD-10-CM | POA: Diagnosis not present

## 2020-02-02 DIAGNOSIS — R778 Other specified abnormalities of plasma proteins: Secondary | ICD-10-CM | POA: Diagnosis not present

## 2020-02-02 DIAGNOSIS — G9389 Other specified disorders of brain: Secondary | ICD-10-CM | POA: Diagnosis not present

## 2020-02-02 DIAGNOSIS — I248 Other forms of acute ischemic heart disease: Secondary | ICD-10-CM | POA: Diagnosis not present

## 2020-02-02 DIAGNOSIS — E860 Dehydration: Secondary | ICD-10-CM | POA: Diagnosis not present

## 2020-02-02 DIAGNOSIS — N179 Acute kidney failure, unspecified: Secondary | ICD-10-CM | POA: Diagnosis not present

## 2020-02-02 DIAGNOSIS — I6782 Cerebral ischemia: Secondary | ICD-10-CM | POA: Diagnosis not present

## 2020-02-02 DIAGNOSIS — G9341 Metabolic encephalopathy: Secondary | ICD-10-CM | POA: Diagnosis not present

## 2020-02-02 DIAGNOSIS — I517 Cardiomegaly: Secondary | ICD-10-CM | POA: Diagnosis not present

## 2020-02-02 DIAGNOSIS — R739 Hyperglycemia, unspecified: Secondary | ICD-10-CM | POA: Diagnosis not present

## 2020-02-02 DIAGNOSIS — N189 Chronic kidney disease, unspecified: Secondary | ICD-10-CM | POA: Diagnosis not present

## 2020-02-02 DIAGNOSIS — E1042 Type 1 diabetes mellitus with diabetic polyneuropathy: Secondary | ICD-10-CM | POA: Diagnosis not present

## 2020-02-02 DIAGNOSIS — I499 Cardiac arrhythmia, unspecified: Secondary | ICD-10-CM | POA: Diagnosis not present

## 2020-02-02 DIAGNOSIS — I480 Paroxysmal atrial fibrillation: Secondary | ICD-10-CM | POA: Diagnosis not present

## 2020-02-02 DIAGNOSIS — E782 Mixed hyperlipidemia: Secondary | ICD-10-CM | POA: Diagnosis not present

## 2020-02-02 DIAGNOSIS — Z743 Need for continuous supervision: Secondary | ICD-10-CM | POA: Diagnosis not present

## 2020-02-02 DIAGNOSIS — I4891 Unspecified atrial fibrillation: Secondary | ICD-10-CM | POA: Diagnosis not present

## 2020-02-02 DIAGNOSIS — E1010 Type 1 diabetes mellitus with ketoacidosis without coma: Secondary | ICD-10-CM | POA: Diagnosis not present

## 2020-02-02 DIAGNOSIS — E1110 Type 2 diabetes mellitus with ketoacidosis without coma: Secondary | ICD-10-CM | POA: Diagnosis not present

## 2020-02-02 DIAGNOSIS — R4020 Unspecified coma: Secondary | ICD-10-CM | POA: Diagnosis not present

## 2020-02-02 DIAGNOSIS — F331 Major depressive disorder, recurrent, moderate: Secondary | ICD-10-CM | POA: Diagnosis not present

## 2020-02-02 DIAGNOSIS — R609 Edema, unspecified: Secondary | ICD-10-CM | POA: Diagnosis not present

## 2020-02-02 DIAGNOSIS — E875 Hyperkalemia: Secondary | ICD-10-CM | POA: Diagnosis not present

## 2020-02-02 DIAGNOSIS — I472 Ventricular tachycardia: Secondary | ICD-10-CM | POA: Diagnosis not present

## 2020-02-02 DIAGNOSIS — E1122 Type 2 diabetes mellitus with diabetic chronic kidney disease: Secondary | ICD-10-CM | POA: Diagnosis not present

## 2020-02-02 DIAGNOSIS — R9431 Abnormal electrocardiogram [ECG] [EKG]: Secondary | ICD-10-CM | POA: Diagnosis not present

## 2020-02-02 DIAGNOSIS — G43909 Migraine, unspecified, not intractable, without status migrainosus: Secondary | ICD-10-CM | POA: Diagnosis not present

## 2020-02-03 DIAGNOSIS — R4020 Unspecified coma: Secondary | ICD-10-CM | POA: Diagnosis not present

## 2020-02-03 DIAGNOSIS — G43909 Migraine, unspecified, not intractable, without status migrainosus: Secondary | ICD-10-CM | POA: Diagnosis not present

## 2020-02-03 DIAGNOSIS — I6782 Cerebral ischemia: Secondary | ICD-10-CM | POA: Diagnosis not present

## 2020-02-03 DIAGNOSIS — G9389 Other specified disorders of brain: Secondary | ICD-10-CM | POA: Diagnosis not present

## 2020-02-08 DIAGNOSIS — G43909 Migraine, unspecified, not intractable, without status migrainosus: Secondary | ICD-10-CM | POA: Diagnosis not present

## 2020-02-08 DIAGNOSIS — J0100 Acute maxillary sinusitis, unspecified: Secondary | ICD-10-CM | POA: Diagnosis not present

## 2020-02-08 DIAGNOSIS — J4540 Moderate persistent asthma, uncomplicated: Secondary | ICD-10-CM | POA: Diagnosis not present

## 2020-02-08 DIAGNOSIS — E1065 Type 1 diabetes mellitus with hyperglycemia: Secondary | ICD-10-CM | POA: Diagnosis not present

## 2020-02-08 DIAGNOSIS — K219 Gastro-esophageal reflux disease without esophagitis: Secondary | ICD-10-CM | POA: Diagnosis not present

## 2020-02-08 DIAGNOSIS — I4891 Unspecified atrial fibrillation: Secondary | ICD-10-CM | POA: Diagnosis not present

## 2020-02-08 DIAGNOSIS — E782 Mixed hyperlipidemia: Secondary | ICD-10-CM | POA: Diagnosis not present

## 2020-02-08 DIAGNOSIS — F331 Major depressive disorder, recurrent, moderate: Secondary | ICD-10-CM | POA: Diagnosis not present

## 2020-02-08 DIAGNOSIS — D6489 Other specified anemias: Secondary | ICD-10-CM | POA: Diagnosis not present

## 2020-02-12 DIAGNOSIS — E1165 Type 2 diabetes mellitus with hyperglycemia: Secondary | ICD-10-CM | POA: Diagnosis not present

## 2020-02-12 DIAGNOSIS — E039 Hypothyroidism, unspecified: Secondary | ICD-10-CM | POA: Diagnosis not present

## 2020-02-12 DIAGNOSIS — I10 Essential (primary) hypertension: Secondary | ICD-10-CM | POA: Diagnosis not present

## 2020-02-12 DIAGNOSIS — K219 Gastro-esophageal reflux disease without esophagitis: Secondary | ICD-10-CM | POA: Diagnosis not present

## 2020-02-12 DIAGNOSIS — E1142 Type 2 diabetes mellitus with diabetic polyneuropathy: Secondary | ICD-10-CM | POA: Diagnosis not present

## 2020-02-12 DIAGNOSIS — E782 Mixed hyperlipidemia: Secondary | ICD-10-CM | POA: Diagnosis not present

## 2020-02-14 DIAGNOSIS — E782 Mixed hyperlipidemia: Secondary | ICD-10-CM | POA: Diagnosis not present

## 2020-02-14 DIAGNOSIS — I4891 Unspecified atrial fibrillation: Secondary | ICD-10-CM | POA: Diagnosis not present

## 2020-02-14 DIAGNOSIS — F331 Major depressive disorder, recurrent, moderate: Secondary | ICD-10-CM | POA: Diagnosis not present

## 2020-02-14 DIAGNOSIS — D6489 Other specified anemias: Secondary | ICD-10-CM | POA: Diagnosis not present

## 2020-02-14 DIAGNOSIS — K219 Gastro-esophageal reflux disease without esophagitis: Secondary | ICD-10-CM | POA: Diagnosis not present

## 2020-02-14 DIAGNOSIS — J0100 Acute maxillary sinusitis, unspecified: Secondary | ICD-10-CM | POA: Diagnosis not present

## 2020-02-14 DIAGNOSIS — J4540 Moderate persistent asthma, uncomplicated: Secondary | ICD-10-CM | POA: Diagnosis not present

## 2020-02-14 DIAGNOSIS — E1065 Type 1 diabetes mellitus with hyperglycemia: Secondary | ICD-10-CM | POA: Diagnosis not present

## 2020-02-14 DIAGNOSIS — G43909 Migraine, unspecified, not intractable, without status migrainosus: Secondary | ICD-10-CM | POA: Diagnosis not present

## 2020-02-16 DIAGNOSIS — R079 Chest pain, unspecified: Secondary | ICD-10-CM | POA: Diagnosis not present

## 2020-02-16 DIAGNOSIS — U071 COVID-19: Secondary | ICD-10-CM | POA: Diagnosis not present

## 2020-02-17 DIAGNOSIS — E1165 Type 2 diabetes mellitus with hyperglycemia: Secondary | ICD-10-CM | POA: Diagnosis not present

## 2020-02-17 DIAGNOSIS — R5381 Other malaise: Secondary | ICD-10-CM | POA: Diagnosis not present

## 2020-02-17 DIAGNOSIS — R110 Nausea: Secondary | ICD-10-CM | POA: Diagnosis not present

## 2020-02-17 DIAGNOSIS — R1111 Vomiting without nausea: Secondary | ICD-10-CM | POA: Diagnosis not present

## 2020-02-18 DIAGNOSIS — E1022 Type 1 diabetes mellitus with diabetic chronic kidney disease: Secondary | ICD-10-CM | POA: Diagnosis not present

## 2020-02-18 DIAGNOSIS — J811 Chronic pulmonary edema: Secondary | ICD-10-CM | POA: Diagnosis not present

## 2020-02-18 DIAGNOSIS — A419 Sepsis, unspecified organism: Secondary | ICD-10-CM | POA: Diagnosis not present

## 2020-02-18 DIAGNOSIS — I249 Acute ischemic heart disease, unspecified: Secondary | ICD-10-CM | POA: Diagnosis not present

## 2020-02-18 DIAGNOSIS — E1110 Type 2 diabetes mellitus with ketoacidosis without coma: Secondary | ICD-10-CM | POA: Diagnosis not present

## 2020-02-18 DIAGNOSIS — N179 Acute kidney failure, unspecified: Secondary | ICD-10-CM | POA: Diagnosis not present

## 2020-02-18 DIAGNOSIS — E870 Hyperosmolality and hypernatremia: Secondary | ICD-10-CM | POA: Diagnosis not present

## 2020-02-18 DIAGNOSIS — G9341 Metabolic encephalopathy: Secondary | ICD-10-CM | POA: Diagnosis not present

## 2020-02-18 DIAGNOSIS — N184 Chronic kidney disease, stage 4 (severe): Secondary | ICD-10-CM | POA: Diagnosis not present

## 2020-02-18 DIAGNOSIS — E1165 Type 2 diabetes mellitus with hyperglycemia: Secondary | ICD-10-CM | POA: Diagnosis not present

## 2020-02-18 DIAGNOSIS — U071 COVID-19: Secondary | ICD-10-CM | POA: Diagnosis not present

## 2020-02-18 DIAGNOSIS — R410 Disorientation, unspecified: Secondary | ICD-10-CM | POA: Diagnosis not present

## 2020-02-18 DIAGNOSIS — E1010 Type 1 diabetes mellitus with ketoacidosis without coma: Secondary | ICD-10-CM | POA: Diagnosis not present

## 2020-02-18 DIAGNOSIS — R000 Tachycardia, unspecified: Secondary | ICD-10-CM | POA: Diagnosis not present

## 2020-02-18 DIAGNOSIS — F331 Major depressive disorder, recurrent, moderate: Secondary | ICD-10-CM | POA: Diagnosis not present

## 2020-02-18 DIAGNOSIS — Z452 Encounter for adjustment and management of vascular access device: Secondary | ICD-10-CM | POA: Diagnosis not present

## 2020-02-18 DIAGNOSIS — R404 Transient alteration of awareness: Secondary | ICD-10-CM | POA: Diagnosis not present

## 2020-02-18 DIAGNOSIS — R7612 Nonspecific reaction to cell mediated immunity measurement of gamma interferon antigen response without active tuberculosis: Secondary | ICD-10-CM | POA: Diagnosis not present

## 2020-02-18 DIAGNOSIS — R0689 Other abnormalities of breathing: Secondary | ICD-10-CM | POA: Diagnosis not present

## 2020-02-18 DIAGNOSIS — R4182 Altered mental status, unspecified: Secondary | ICD-10-CM | POA: Diagnosis not present

## 2020-02-18 DIAGNOSIS — D649 Anemia, unspecified: Secondary | ICD-10-CM | POA: Diagnosis not present

## 2020-02-18 DIAGNOSIS — I248 Other forms of acute ischemic heart disease: Secondary | ICD-10-CM | POA: Diagnosis not present

## 2020-02-18 DIAGNOSIS — R918 Other nonspecific abnormal finding of lung field: Secondary | ICD-10-CM | POA: Diagnosis not present

## 2020-02-18 DIAGNOSIS — I517 Cardiomegaly: Secondary | ICD-10-CM | POA: Diagnosis not present

## 2020-02-19 DIAGNOSIS — E1010 Type 1 diabetes mellitus with ketoacidosis without coma: Secondary | ICD-10-CM | POA: Diagnosis not present

## 2020-02-19 DIAGNOSIS — I249 Acute ischemic heart disease, unspecified: Secondary | ICD-10-CM | POA: Diagnosis not present

## 2020-02-19 DIAGNOSIS — N179 Acute kidney failure, unspecified: Secondary | ICD-10-CM | POA: Diagnosis not present

## 2020-02-20 DIAGNOSIS — I249 Acute ischemic heart disease, unspecified: Secondary | ICD-10-CM | POA: Diagnosis not present

## 2020-02-20 DIAGNOSIS — N179 Acute kidney failure, unspecified: Secondary | ICD-10-CM | POA: Diagnosis not present

## 2020-02-20 DIAGNOSIS — E1010 Type 1 diabetes mellitus with ketoacidosis without coma: Secondary | ICD-10-CM | POA: Diagnosis not present

## 2020-02-23 DIAGNOSIS — Z6829 Body mass index (BMI) 29.0-29.9, adult: Secondary | ICD-10-CM | POA: Diagnosis not present

## 2020-02-23 DIAGNOSIS — E108 Type 1 diabetes mellitus with unspecified complications: Secondary | ICD-10-CM | POA: Diagnosis not present

## 2020-02-26 DIAGNOSIS — R109 Unspecified abdominal pain: Secondary | ICD-10-CM | POA: Diagnosis not present

## 2020-02-26 DIAGNOSIS — R739 Hyperglycemia, unspecified: Secondary | ICD-10-CM | POA: Diagnosis not present

## 2020-02-26 DIAGNOSIS — E1165 Type 2 diabetes mellitus with hyperglycemia: Secondary | ICD-10-CM | POA: Diagnosis not present

## 2020-02-26 DIAGNOSIS — E1010 Type 1 diabetes mellitus with ketoacidosis without coma: Secondary | ICD-10-CM | POA: Diagnosis not present

## 2020-02-26 DIAGNOSIS — E1065 Type 1 diabetes mellitus with hyperglycemia: Secondary | ICD-10-CM | POA: Diagnosis not present

## 2020-02-26 DIAGNOSIS — R402421 Glasgow coma scale score 9-12, in the field [EMT or ambulance]: Secondary | ICD-10-CM | POA: Diagnosis not present

## 2020-02-26 DIAGNOSIS — E875 Hyperkalemia: Secondary | ICD-10-CM | POA: Diagnosis not present

## 2020-02-26 DIAGNOSIS — D649 Anemia, unspecified: Secondary | ICD-10-CM | POA: Diagnosis not present

## 2020-02-26 DIAGNOSIS — Z20822 Contact with and (suspected) exposure to covid-19: Secondary | ICD-10-CM | POA: Diagnosis not present

## 2020-02-26 DIAGNOSIS — Z794 Long term (current) use of insulin: Secondary | ICD-10-CM | POA: Diagnosis not present

## 2020-02-26 DIAGNOSIS — G43909 Migraine, unspecified, not intractable, without status migrainosus: Secondary | ICD-10-CM | POA: Diagnosis not present

## 2020-02-26 DIAGNOSIS — G9341 Metabolic encephalopathy: Secondary | ICD-10-CM | POA: Diagnosis not present

## 2020-02-26 DIAGNOSIS — R079 Chest pain, unspecified: Secondary | ICD-10-CM | POA: Diagnosis not present

## 2020-02-26 DIAGNOSIS — R404 Transient alteration of awareness: Secondary | ICD-10-CM | POA: Diagnosis not present

## 2020-02-26 DIAGNOSIS — N179 Acute kidney failure, unspecified: Secondary | ICD-10-CM | POA: Diagnosis not present

## 2020-02-26 DIAGNOSIS — I4891 Unspecified atrial fibrillation: Secondary | ICD-10-CM | POA: Diagnosis not present

## 2020-02-26 DIAGNOSIS — R000 Tachycardia, unspecified: Secondary | ICD-10-CM | POA: Diagnosis not present

## 2020-02-26 DIAGNOSIS — R0689 Other abnormalities of breathing: Secondary | ICD-10-CM | POA: Diagnosis not present

## 2020-02-26 DIAGNOSIS — I10 Essential (primary) hypertension: Secondary | ICD-10-CM | POA: Diagnosis not present

## 2020-02-26 DIAGNOSIS — I700 Atherosclerosis of aorta: Secondary | ICD-10-CM | POA: Diagnosis not present

## 2020-02-26 DIAGNOSIS — A179 Tuberculosis of nervous system, unspecified: Secondary | ICD-10-CM | POA: Diagnosis not present

## 2020-02-26 DIAGNOSIS — E7849 Other hyperlipidemia: Secondary | ICD-10-CM | POA: Diagnosis not present

## 2020-02-26 DIAGNOSIS — E1042 Type 1 diabetes mellitus with diabetic polyneuropathy: Secondary | ICD-10-CM | POA: Diagnosis not present

## 2020-02-26 DIAGNOSIS — I248 Other forms of acute ischemic heart disease: Secondary | ICD-10-CM | POA: Diagnosis not present

## 2020-02-27 DIAGNOSIS — E875 Hyperkalemia: Secondary | ICD-10-CM | POA: Diagnosis not present

## 2020-02-27 DIAGNOSIS — N179 Acute kidney failure, unspecified: Secondary | ICD-10-CM | POA: Diagnosis not present

## 2020-02-27 DIAGNOSIS — E1010 Type 1 diabetes mellitus with ketoacidosis without coma: Secondary | ICD-10-CM | POA: Diagnosis not present

## 2020-02-28 DIAGNOSIS — E875 Hyperkalemia: Secondary | ICD-10-CM | POA: Diagnosis not present

## 2020-02-28 DIAGNOSIS — N179 Acute kidney failure, unspecified: Secondary | ICD-10-CM | POA: Diagnosis not present

## 2020-02-28 DIAGNOSIS — E1010 Type 1 diabetes mellitus with ketoacidosis without coma: Secondary | ICD-10-CM | POA: Diagnosis not present

## 2020-02-29 ENCOUNTER — Encounter: Payer: Self-pay | Admitting: Family Medicine

## 2020-02-29 DIAGNOSIS — E875 Hyperkalemia: Secondary | ICD-10-CM | POA: Diagnosis not present

## 2020-02-29 DIAGNOSIS — A179 Tuberculosis of nervous system, unspecified: Secondary | ICD-10-CM | POA: Diagnosis not present

## 2020-02-29 DIAGNOSIS — E1010 Type 1 diabetes mellitus with ketoacidosis without coma: Secondary | ICD-10-CM | POA: Diagnosis not present

## 2020-03-01 DIAGNOSIS — A179 Tuberculosis of nervous system, unspecified: Secondary | ICD-10-CM | POA: Diagnosis not present

## 2020-03-01 DIAGNOSIS — E1010 Type 1 diabetes mellitus with ketoacidosis without coma: Secondary | ICD-10-CM | POA: Diagnosis not present

## 2020-03-01 DIAGNOSIS — E875 Hyperkalemia: Secondary | ICD-10-CM | POA: Diagnosis not present

## 2020-03-06 DIAGNOSIS — E1065 Type 1 diabetes mellitus with hyperglycemia: Secondary | ICD-10-CM | POA: Diagnosis not present

## 2020-03-06 DIAGNOSIS — D6489 Other specified anemias: Secondary | ICD-10-CM | POA: Diagnosis not present

## 2020-03-06 DIAGNOSIS — F331 Major depressive disorder, recurrent, moderate: Secondary | ICD-10-CM | POA: Diagnosis not present

## 2020-03-06 DIAGNOSIS — E782 Mixed hyperlipidemia: Secondary | ICD-10-CM | POA: Diagnosis not present

## 2020-03-06 DIAGNOSIS — I4891 Unspecified atrial fibrillation: Secondary | ICD-10-CM | POA: Diagnosis not present

## 2020-03-06 DIAGNOSIS — J4540 Moderate persistent asthma, uncomplicated: Secondary | ICD-10-CM | POA: Diagnosis not present

## 2020-03-06 DIAGNOSIS — K219 Gastro-esophageal reflux disease without esophagitis: Secondary | ICD-10-CM | POA: Diagnosis not present

## 2020-03-06 DIAGNOSIS — J0100 Acute maxillary sinusitis, unspecified: Secondary | ICD-10-CM | POA: Diagnosis not present

## 2020-03-06 DIAGNOSIS — G43909 Migraine, unspecified, not intractable, without status migrainosus: Secondary | ICD-10-CM | POA: Diagnosis not present

## 2020-03-07 DIAGNOSIS — I480 Paroxysmal atrial fibrillation: Secondary | ICD-10-CM | POA: Diagnosis not present

## 2020-03-07 DIAGNOSIS — E1110 Type 2 diabetes mellitus with ketoacidosis without coma: Secondary | ICD-10-CM | POA: Diagnosis not present

## 2020-03-09 DIAGNOSIS — E872 Acidosis: Secondary | ICD-10-CM | POA: Diagnosis not present

## 2020-03-09 DIAGNOSIS — G9341 Metabolic encephalopathy: Secondary | ICD-10-CM | POA: Diagnosis not present

## 2020-03-09 DIAGNOSIS — F331 Major depressive disorder, recurrent, moderate: Secondary | ICD-10-CM | POA: Diagnosis not present

## 2020-03-09 DIAGNOSIS — N179 Acute kidney failure, unspecified: Secondary | ICD-10-CM | POA: Diagnosis not present

## 2020-03-09 DIAGNOSIS — E1010 Type 1 diabetes mellitus with ketoacidosis without coma: Secondary | ICD-10-CM | POA: Diagnosis not present

## 2020-03-09 DIAGNOSIS — I248 Other forms of acute ischemic heart disease: Secondary | ICD-10-CM | POA: Diagnosis not present

## 2020-03-09 DIAGNOSIS — E876 Hypokalemia: Secondary | ICD-10-CM | POA: Diagnosis not present

## 2020-03-09 DIAGNOSIS — E1022 Type 1 diabetes mellitus with diabetic chronic kidney disease: Secondary | ICD-10-CM | POA: Diagnosis not present

## 2020-03-09 DIAGNOSIS — N184 Chronic kidney disease, stage 4 (severe): Secondary | ICD-10-CM | POA: Diagnosis not present

## 2020-03-10 DIAGNOSIS — N184 Chronic kidney disease, stage 4 (severe): Secondary | ICD-10-CM | POA: Diagnosis not present

## 2020-03-10 DIAGNOSIS — G9341 Metabolic encephalopathy: Secondary | ICD-10-CM | POA: Diagnosis not present

## 2020-03-10 DIAGNOSIS — E876 Hypokalemia: Secondary | ICD-10-CM | POA: Diagnosis not present

## 2020-03-10 DIAGNOSIS — N179 Acute kidney failure, unspecified: Secondary | ICD-10-CM | POA: Diagnosis not present

## 2020-03-10 DIAGNOSIS — I248 Other forms of acute ischemic heart disease: Secondary | ICD-10-CM | POA: Diagnosis not present

## 2020-03-10 DIAGNOSIS — E1010 Type 1 diabetes mellitus with ketoacidosis without coma: Secondary | ICD-10-CM | POA: Diagnosis not present

## 2020-03-10 DIAGNOSIS — E1022 Type 1 diabetes mellitus with diabetic chronic kidney disease: Secondary | ICD-10-CM | POA: Diagnosis not present

## 2020-03-10 DIAGNOSIS — F331 Major depressive disorder, recurrent, moderate: Secondary | ICD-10-CM | POA: Diagnosis not present

## 2020-03-10 DIAGNOSIS — E872 Acidosis: Secondary | ICD-10-CM | POA: Diagnosis not present

## 2020-03-13 DIAGNOSIS — N179 Acute kidney failure, unspecified: Secondary | ICD-10-CM | POA: Diagnosis not present

## 2020-03-13 DIAGNOSIS — N184 Chronic kidney disease, stage 4 (severe): Secondary | ICD-10-CM | POA: Diagnosis not present

## 2020-03-13 DIAGNOSIS — G9341 Metabolic encephalopathy: Secondary | ICD-10-CM | POA: Diagnosis not present

## 2020-03-13 DIAGNOSIS — E1022 Type 1 diabetes mellitus with diabetic chronic kidney disease: Secondary | ICD-10-CM | POA: Diagnosis not present

## 2020-03-13 DIAGNOSIS — F331 Major depressive disorder, recurrent, moderate: Secondary | ICD-10-CM | POA: Diagnosis not present

## 2020-03-13 DIAGNOSIS — E872 Acidosis: Secondary | ICD-10-CM | POA: Diagnosis not present

## 2020-03-13 DIAGNOSIS — E876 Hypokalemia: Secondary | ICD-10-CM | POA: Diagnosis not present

## 2020-03-13 DIAGNOSIS — E1010 Type 1 diabetes mellitus with ketoacidosis without coma: Secondary | ICD-10-CM | POA: Diagnosis not present

## 2020-03-13 DIAGNOSIS — I248 Other forms of acute ischemic heart disease: Secondary | ICD-10-CM | POA: Diagnosis not present

## 2020-03-19 DIAGNOSIS — N184 Chronic kidney disease, stage 4 (severe): Secondary | ICD-10-CM | POA: Diagnosis not present

## 2020-03-19 DIAGNOSIS — G9341 Metabolic encephalopathy: Secondary | ICD-10-CM | POA: Diagnosis not present

## 2020-03-19 DIAGNOSIS — N179 Acute kidney failure, unspecified: Secondary | ICD-10-CM | POA: Diagnosis not present

## 2020-03-19 DIAGNOSIS — E876 Hypokalemia: Secondary | ICD-10-CM | POA: Diagnosis not present

## 2020-03-19 DIAGNOSIS — I248 Other forms of acute ischemic heart disease: Secondary | ICD-10-CM | POA: Diagnosis not present

## 2020-03-19 DIAGNOSIS — E1010 Type 1 diabetes mellitus with ketoacidosis without coma: Secondary | ICD-10-CM | POA: Diagnosis not present

## 2020-03-19 DIAGNOSIS — E1022 Type 1 diabetes mellitus with diabetic chronic kidney disease: Secondary | ICD-10-CM | POA: Diagnosis not present

## 2020-03-19 DIAGNOSIS — E872 Acidosis: Secondary | ICD-10-CM | POA: Diagnosis not present

## 2020-03-19 DIAGNOSIS — F331 Major depressive disorder, recurrent, moderate: Secondary | ICD-10-CM | POA: Diagnosis not present

## 2020-03-20 DIAGNOSIS — E109 Type 1 diabetes mellitus without complications: Secondary | ICD-10-CM | POA: Diagnosis not present

## 2020-03-20 DIAGNOSIS — E119 Type 2 diabetes mellitus without complications: Secondary | ICD-10-CM | POA: Diagnosis not present

## 2020-03-20 DIAGNOSIS — R1110 Vomiting, unspecified: Secondary | ICD-10-CM | POA: Diagnosis not present

## 2020-03-20 DIAGNOSIS — R110 Nausea: Secondary | ICD-10-CM | POA: Diagnosis not present

## 2020-03-28 DIAGNOSIS — E1065 Type 1 diabetes mellitus with hyperglycemia: Secondary | ICD-10-CM | POA: Diagnosis not present

## 2020-03-28 DIAGNOSIS — E7849 Other hyperlipidemia: Secondary | ICD-10-CM | POA: Diagnosis not present

## 2020-03-28 DIAGNOSIS — Z794 Long term (current) use of insulin: Secondary | ICD-10-CM | POA: Diagnosis not present

## 2020-03-28 DIAGNOSIS — I10 Essential (primary) hypertension: Secondary | ICD-10-CM | POA: Diagnosis not present

## 2020-03-29 DIAGNOSIS — E1110 Type 2 diabetes mellitus with ketoacidosis without coma: Secondary | ICD-10-CM | POA: Diagnosis not present

## 2020-03-29 DIAGNOSIS — R739 Hyperglycemia, unspecified: Secondary | ICD-10-CM | POA: Diagnosis not present

## 2020-03-29 DIAGNOSIS — E039 Hypothyroidism, unspecified: Secondary | ICD-10-CM | POA: Diagnosis not present

## 2020-03-29 DIAGNOSIS — I4891 Unspecified atrial fibrillation: Secondary | ICD-10-CM | POA: Diagnosis not present

## 2020-03-29 DIAGNOSIS — K219 Gastro-esophageal reflux disease without esophagitis: Secondary | ICD-10-CM | POA: Diagnosis not present

## 2020-03-29 DIAGNOSIS — R7402 Elevation of levels of lactic acid dehydrogenase (LDH): Secondary | ICD-10-CM | POA: Diagnosis not present

## 2020-03-29 DIAGNOSIS — N179 Acute kidney failure, unspecified: Secondary | ICD-10-CM | POA: Diagnosis not present

## 2020-03-29 DIAGNOSIS — G9341 Metabolic encephalopathy: Secondary | ICD-10-CM | POA: Diagnosis not present

## 2020-03-29 DIAGNOSIS — E1165 Type 2 diabetes mellitus with hyperglycemia: Secondary | ICD-10-CM | POA: Diagnosis not present

## 2020-03-29 DIAGNOSIS — E875 Hyperkalemia: Secondary | ICD-10-CM | POA: Diagnosis not present

## 2020-03-29 DIAGNOSIS — R000 Tachycardia, unspecified: Secondary | ICD-10-CM | POA: Diagnosis not present

## 2020-03-29 DIAGNOSIS — E1042 Type 1 diabetes mellitus with diabetic polyneuropathy: Secondary | ICD-10-CM | POA: Diagnosis not present

## 2020-03-29 DIAGNOSIS — E1010 Type 1 diabetes mellitus with ketoacidosis without coma: Secondary | ICD-10-CM | POA: Diagnosis not present

## 2020-03-29 DIAGNOSIS — R0689 Other abnormalities of breathing: Secondary | ICD-10-CM | POA: Diagnosis not present

## 2020-03-29 DIAGNOSIS — F331 Major depressive disorder, recurrent, moderate: Secondary | ICD-10-CM | POA: Diagnosis not present

## 2020-03-29 DIAGNOSIS — I959 Hypotension, unspecified: Secondary | ICD-10-CM | POA: Diagnosis not present

## 2020-03-30 DIAGNOSIS — E875 Hyperkalemia: Secondary | ICD-10-CM | POA: Diagnosis not present

## 2020-03-30 DIAGNOSIS — N179 Acute kidney failure, unspecified: Secondary | ICD-10-CM | POA: Diagnosis not present

## 2020-03-30 DIAGNOSIS — E1010 Type 1 diabetes mellitus with ketoacidosis without coma: Secondary | ICD-10-CM | POA: Diagnosis not present

## 2020-03-31 DIAGNOSIS — E875 Hyperkalemia: Secondary | ICD-10-CM | POA: Diagnosis not present

## 2020-03-31 DIAGNOSIS — N179 Acute kidney failure, unspecified: Secondary | ICD-10-CM | POA: Diagnosis not present

## 2020-03-31 DIAGNOSIS — E1010 Type 1 diabetes mellitus with ketoacidosis without coma: Secondary | ICD-10-CM | POA: Diagnosis not present

## 2020-04-01 ENCOUNTER — Ambulatory Visit: Payer: Medicare HMO | Admitting: Cardiology

## 2020-04-02 DIAGNOSIS — G43909 Migraine, unspecified, not intractable, without status migrainosus: Secondary | ICD-10-CM | POA: Diagnosis not present

## 2020-04-02 DIAGNOSIS — F331 Major depressive disorder, recurrent, moderate: Secondary | ICD-10-CM | POA: Diagnosis not present

## 2020-04-02 DIAGNOSIS — J4540 Moderate persistent asthma, uncomplicated: Secondary | ICD-10-CM | POA: Diagnosis not present

## 2020-04-02 DIAGNOSIS — D6489 Other specified anemias: Secondary | ICD-10-CM | POA: Diagnosis not present

## 2020-04-02 DIAGNOSIS — K219 Gastro-esophageal reflux disease without esophagitis: Secondary | ICD-10-CM | POA: Diagnosis not present

## 2020-04-02 DIAGNOSIS — I4891 Unspecified atrial fibrillation: Secondary | ICD-10-CM | POA: Diagnosis not present

## 2020-04-02 DIAGNOSIS — E1065 Type 1 diabetes mellitus with hyperglycemia: Secondary | ICD-10-CM | POA: Diagnosis not present

## 2020-04-02 DIAGNOSIS — E782 Mixed hyperlipidemia: Secondary | ICD-10-CM | POA: Diagnosis not present

## 2020-04-03 DIAGNOSIS — N179 Acute kidney failure, unspecified: Secondary | ICD-10-CM | POA: Diagnosis not present

## 2020-04-03 DIAGNOSIS — E872 Acidosis: Secondary | ICD-10-CM | POA: Diagnosis not present

## 2020-04-03 DIAGNOSIS — F331 Major depressive disorder, recurrent, moderate: Secondary | ICD-10-CM | POA: Diagnosis not present

## 2020-04-03 DIAGNOSIS — N184 Chronic kidney disease, stage 4 (severe): Secondary | ICD-10-CM | POA: Diagnosis not present

## 2020-04-03 DIAGNOSIS — E1022 Type 1 diabetes mellitus with diabetic chronic kidney disease: Secondary | ICD-10-CM | POA: Diagnosis not present

## 2020-04-03 DIAGNOSIS — I248 Other forms of acute ischemic heart disease: Secondary | ICD-10-CM | POA: Diagnosis not present

## 2020-04-03 DIAGNOSIS — E876 Hypokalemia: Secondary | ICD-10-CM | POA: Diagnosis not present

## 2020-04-03 DIAGNOSIS — G9341 Metabolic encephalopathy: Secondary | ICD-10-CM | POA: Diagnosis not present

## 2020-04-03 DIAGNOSIS — E1010 Type 1 diabetes mellitus with ketoacidosis without coma: Secondary | ICD-10-CM | POA: Diagnosis not present

## 2020-04-04 DIAGNOSIS — E162 Hypoglycemia, unspecified: Secondary | ICD-10-CM | POA: Diagnosis not present

## 2020-04-04 DIAGNOSIS — R946 Abnormal results of thyroid function studies: Secondary | ICD-10-CM | POA: Diagnosis not present

## 2020-04-04 DIAGNOSIS — R1110 Vomiting, unspecified: Secondary | ICD-10-CM | POA: Diagnosis not present

## 2020-04-04 DIAGNOSIS — Z6827 Body mass index (BMI) 27.0-27.9, adult: Secondary | ICD-10-CM | POA: Diagnosis not present

## 2020-04-08 ENCOUNTER — Encounter: Payer: Self-pay | Admitting: Internal Medicine

## 2020-04-08 ENCOUNTER — Ambulatory Visit (INDEPENDENT_AMBULATORY_CARE_PROVIDER_SITE_OTHER): Payer: Medicare HMO | Admitting: Internal Medicine

## 2020-04-08 ENCOUNTER — Other Ambulatory Visit: Payer: Self-pay

## 2020-04-08 VITALS — BP 138/88 | HR 75 | Ht 68.0 in | Wt 171.8 lb

## 2020-04-08 DIAGNOSIS — J4540 Moderate persistent asthma, uncomplicated: Secondary | ICD-10-CM | POA: Diagnosis not present

## 2020-04-08 DIAGNOSIS — B351 Tinea unguium: Secondary | ICD-10-CM | POA: Diagnosis not present

## 2020-04-08 DIAGNOSIS — E1010 Type 1 diabetes mellitus with ketoacidosis without coma: Secondary | ICD-10-CM

## 2020-04-08 DIAGNOSIS — I248 Other forms of acute ischemic heart disease: Secondary | ICD-10-CM | POA: Diagnosis not present

## 2020-04-08 DIAGNOSIS — M722 Plantar fascial fibromatosis: Secondary | ICD-10-CM | POA: Diagnosis not present

## 2020-04-08 DIAGNOSIS — G9341 Metabolic encephalopathy: Secondary | ICD-10-CM | POA: Diagnosis not present

## 2020-04-08 DIAGNOSIS — E1022 Type 1 diabetes mellitus with diabetic chronic kidney disease: Secondary | ICD-10-CM | POA: Diagnosis not present

## 2020-04-08 DIAGNOSIS — D631 Anemia in chronic kidney disease: Secondary | ICD-10-CM | POA: Diagnosis not present

## 2020-04-08 DIAGNOSIS — N184 Chronic kidney disease, stage 4 (severe): Secondary | ICD-10-CM | POA: Diagnosis not present

## 2020-04-08 DIAGNOSIS — E1065 Type 1 diabetes mellitus with hyperglycemia: Secondary | ICD-10-CM | POA: Diagnosis not present

## 2020-04-08 DIAGNOSIS — M79673 Pain in unspecified foot: Secondary | ICD-10-CM | POA: Diagnosis not present

## 2020-04-08 DIAGNOSIS — I4891 Unspecified atrial fibrillation: Secondary | ICD-10-CM | POA: Diagnosis not present

## 2020-04-08 DIAGNOSIS — F331 Major depressive disorder, recurrent, moderate: Secondary | ICD-10-CM | POA: Diagnosis not present

## 2020-04-08 MED ORDER — DEXCOM G6 TRANSMITTER MISC: 1.0000 | 3 refills | Status: AC

## 2020-04-08 MED ORDER — GLUCAGON 3 MG/DOSE NA POWD: 3.0000 mg | Freq: Once | NASAL | 11 refills | Status: AC | PRN

## 2020-04-08 MED ORDER — DEXCOM G6 SENSOR MISC: 1.0000 | 3 refills | Status: AC

## 2020-04-08 MED ORDER — INSULIN LISPRO 100 UNIT/ML ~~LOC~~ SOLN: 2.0000 [IU] | Freq: Three times a day (TID) | SUBCUTANEOUS | 3 refills | Status: AC

## 2020-04-08 MED ORDER — DEXCOM G6 RECEIVER DEVI: 1.0000 | Freq: Once | 0 refills | Status: AC

## 2020-04-08 NOTE — Progress Notes (Signed)
Patient ID: Kathleen Norris, female   DOB: 11-23-1965, 55 y.o.   MRN: [id number]   This visit occurred during the SARS-CoV-2 public health emergency.  Safety protocols were in place, including screening questions prior to the visit, additional usage of staff PPE, and extensive cleaning of exam room while observing appropriate contact time as indicated for disinfecting solutions.   HPI: Kathleen Norris is a 55 y.o.-year-old female, referred by her PCP, Dr. Quillian Quince, for management of DM1, diagnosed in 1990s, uncontrolled, with complications (many DKA episodes, hypoglycemia, peripheral neuropathy).  She previously saw Dr. Dorris Fetch.  He no showed 3 initial visit appointments with him.  She also no showed an initial appointment with me.  At today's visit, she is here with her friend, who drove her here, as she is not driving due to the very labile blood sugars.  DM1: She has a history of innumerable DKA episodes, last several days ago.  Reviewed HbA1c levels: 02/12/2020: HbA1c 11.8% Lab Results  Component Value Date   HGBA1C 13.2 (H) 08/27/2016   HGBA1C 10.3 (A) 09/06/2013   HGBA1C 10.5 % 08/28/2012   She is on the following regimen: -Lantus 100 >> 20 units daily (decreased 1 week ago)  She was on: -Lispro >> stopped b/c hypoglycemia 02/2020 -Metformin 1000 mg twice a day -Januvia 100 mg in a.m.  Meter:  Accu-Chek guide.  She had an insulin pump before but kept dropping sugars too low >> stopped years ago.   Pt checks her sugars 5-6x a day and they are: - am: 109-HI - 30-45 min after b'fast: 46-HI - before lunch: 26-376 - 2h after lunch: n/c - before dinner: 46-580 - 2h after dinner: n/c - bedtime: 100-280 - nighttime: 20s-HI Lowest sugar was 3, 6 (!!!!) 4 mo ago, 27, 32; she does not have hypoglycemia awareness! She has an expired glucagon kit at home. + previous hypoglycemia admissions. Milk is the only thing that works raising her [place]. Highest sugar was HI. + MANY previous DKA  admissions.    Pt's meals are: - Breakfast: may skip 90% b'fast b/c of nausea, [place] sandwich or eggs - Lunch:occasionally skips; TV dinners, Lean Cuisine - Dinner: occasionally skips; TV dinners, Lean Cuisine - Snacks: no She lost ~100 lbs in 3 months 2/2 nausea and vomiting.  - no CKD, last BUN/creatinine:  03/30/2020: 18/1.04, glucose 271 02/12/2020: ACR 33 Lab Results  Component Value Date   BUN 16 08/27/2016   BUN 11 08/28/2012   CREATININE 0.78 08/27/2016   CREATININE 0.67 08/28/2012  On lisinopril 10 mg daily.  -+ HL; last set of lipids: 02/12/2020: 116/171/48/40  Lab Results  Component Value Date   CHOL 187 08/28/2012   HDL 60 08/28/2012   LDLCALC 116 (H) 08/28/2012   TRIG 56 08/28/2012  On atorvastatin 80 mg daily.  - last eye exam was in 2020. No DR reportedly.   - + numbness and tingling in her feet.  She was recently taken off Neurontin and Topamax. She will see a podiatrist today.  She also has uncontrolled Hashimoto's hypothyroidism:  Pt is on levothyroxine 175 mcg daily << decreased yesterday: - in am - fasting - at least 30 min from b'fast - no calcium - no iron - no multivitamins - + PPIs one tab 2x a day - 1st dose 4h before LT4 since OTW she cannot tolerate water 2/2 nausea - not on Biotin  Last TSH: 03/30/2020: TSH 0.012 02/12/2020: TSH 4.08 Lab Results  Component Value Date  TSH 22.354 (H) 08/28/2012   Pt has FH of DM in M, MGF.  She had cardioversions in the past >> she will need to see cardiology again.  She also has a history of HTN.  ROS: Constitutional: no weight gain, + weight loss, no fatigue, no subjective hyperthermia, no subjective hypothermia, no nocturia, + poor sleep Eyes: no blurry vision, no xerophthalmia ENT: no sore throat, no nodules palpated in neck, no dysphagia, no odynophagia, no hoarseness, no tinnitus, no hypoacusis Cardiovascular: no CP, no SOB, no palpitations, no leg swelling Respiratory: no cough, no SOB, no  wheezing Gastrointestinal: + Nausea and vomiting, no D, no C, + acid reflux Musculoskeletal: + Muscle and joint aches Skin: no rash, no hair loss, + itching Neurological: no tremors, no numbness or tingling/no dizziness/no HAs Psychiatric: no depression, no anxiety  Past Medical History:  Diagnosis Date  . Arthritis   . Asthma   . Chronic anxiety   . Chronic pain   . Diabetes mellitus    Diagnosed 1997  . Diabetes with ketoacidosis   . GERD (gastroesophageal reflux disease)   . Sinusitis   . SVT (supraventricular tachycardia) ([place])    Ablation per Dr. Lovena Le in November 2005  . URI (upper respiratory infection)    Past Surgical History:  Procedure Laterality Date  . ANTERIOR CERVICAL DECOMP/DISCECTOMY FUSION N/A 09/03/2016   Procedure: ANTERIOR CERVICAL DECOMPRESSION FUSION  - CERVICAL FOUR-FIVE - CERVICAL FIVE-SIX;  Surgeon: Earnie Larsson, MD;  Location: [place];  Service: Neurosurgery;  Laterality: N/A;  . LEFT HEART CATHETERIZATION WITH CORONARY ANGIOGRAM N/A 02/16/2011   Procedure: LEFT HEART CATHETERIZATION WITH CORONARY ANGIOGRAM;  Surgeon: Burnell Blanks, MD;  Location: [facility] CATH LAB;  Service: Cardiovascular;  Laterality: N/A;  . TUBAL LIGATION     Social History   Socioeconomic History  . Marital status: Married    Spouse name: Not on file  . Number of children: 4  . Years of education: Not on file  . Highest education level: Not on file  Occupational History  . Occupation: Unemployed-Disability  Tobacco Use  . Smoking status: Never Smoker  . Smokeless tobacco: Never Used  Substance and Sexual Activity  . Alcohol use: No  . Drug use: No  . Sexual activity: Not on file  Other Topics Concern  . Not on file  Social History Narrative  . Not on file   Social Determinants of Health   Financial Resource Strain: Not on file  Food Insecurity: Not on file  Transportation Needs: Not on file  Physical Activity: Not on file  Stress: Not on file  Social  Connections: Not on file  Intimate Partner Violence: Not on file   Current Outpatient Medications on File Prior to Visit  Medication Sig Dispense Refill  . albuterol (PROVENTIL HFA;VENTOLIN HFA) 108 (90 BASE) MCG/ACT inhaler Inhale 2 puffs into the lungs every 6 (six) hours as needed for wheezing. 1 Inhaler 2  . ALPRAZolam (XANAX) 0.5 MG tablet TAKE ONE TABLET TWICE DAILY AS NEEDED (Patient taking differently: TAKE ONE TABLET THREE TIMES DAILY) 60 tablet 0  . aspirin 81 MG tablet Take 81 mg by mouth daily.     Marland Kitchen atorvastatin (LIPITOR) 80 MG tablet Take 80 mg by mouth daily.    . Blood Glucose Monitoring Suppl (BAYER CONTOUR MONITOR) W/DEVICE KIT by Does not apply route.    . citalopram (CELEXA) 20 MG tablet Take 20 mg by mouth daily.    Marland Kitchen glucose blood (PRODIGY AUTOCODE TEST) test  strip 1 each by Other route 3 (three) times daily. Use as instructed     . ibuprofen (ADVIL,MOTRIN) 200 MG tablet Take 400 mg by mouth every 6 (six) hours as needed for mild pain or moderate pain.    Marland Kitchen insulin glargine (LANTUS) 100 UNIT/ML injection Inject 20 Units into the skin at bedtime. As of 04/08/20 pt taking 20 units daily    . insulin lispro (HUMALOG) 100 UNIT/ML injection Inject 3-15 Units into the skin 3 (three) times daily before meals. Per sliding scale (Patient not taking: Reported on 04/08/2020)    . levothyroxine (SYNTHROID, LEVOTHROID) 200 MCG tablet Take 200 mcg by mouth daily before breakfast.    . metFORMIN (GLUCOPHAGE) 500 MG tablet Take 500 mg by mouth 4 (four) times daily -  before meals and at bedtime.  (Patient not taking: Reported on 04/08/2020)    . naproxen (NAPROSYN) 500 MG tablet Take 1 tablet (500 mg total) by mouth 2 (two) times daily with a meal. (Patient taking differently: Take 500 mg by mouth 3 (three) times daily with meals. ) 60 tablet 1  . naproxen sodium (ANAPROX) 220 MG tablet Take 220 mg by mouth 3 (three) times daily as needed (pain).    Marland Kitchen omeprazole (PRILOSEC) 40 MG capsule Take 1  capsule (40 mg total) by mouth daily. (Patient taking differently: Take 40 mg by mouth 2 (two) times daily. ) 30 capsule 4  . pantoprazole (PROTONIX) 40 MG tablet Take 40 mg by mouth 2 (two) times daily.    Marland Kitchen PRODIGY LANCETS 21G MISC by Does not apply route 3 (three) times daily.      Marland Kitchen topiramate (TOPAMAX) 50 MG tablet Take 50 mg by mouth 2 (two) times daily. (Patient not taking: Reported on 04/08/2020)     No current facility-administered medications on file prior to visit.   No Known Allergies Family History  Problem Relation Age of Onset  . Heart disease Maternal Uncle   . Diabetes Mother   . Cancer Mother    PE: BP 138/88   Pulse 75   Ht '5\' 8"'  (1.727 m)   Wt 171 lb 12.8 oz (77.9 kg)   SpO2 99%   BMI 26.12 kg/m  Wt Readings from Last 3 Encounters:  04/08/20 171 lb 12.8 oz (77.9 kg)  09/04/16 181 lb (82.1 kg)  08/27/16 181 lb (82.1 kg)   Constitutional: Slightly overweight, in NAD Eyes: PERRLA, EOMI, no exophthalmos ENT: moist mucous membranes, no thyromegaly, no cervical lymphadenopathy Cardiovascular: RRR, No MRG Respiratory: CTA B Gastrointestinal: abdomen soft, NT, ND, BS+ Musculoskeletal: no deformities, strength intact in all 4 Skin: moist, warm, no rashes Neurological: no tremor with outstretched hands, DTR normal in all 4  ASSESSMENT: 1. DM1, uncontrolled, with complications - innumerable DKA episodes - h/o severe hypoglycemia - PN  PLAN:  1. Patient with long-standing, very brittle, uncontrolled DM1.  In the last few years, she started to have severe nausea and had many admissions for DKA.  She has been on insulin pump in the past but she tells me that she had to come off due to hypoglycemia. -Per patient's report was recently decreased from 70 units to 20 units, due to hypoglycemia episodes.  However, more recently, her blood sugars remain very fluctuating, between 20-HI. -She tells me that she was recently taken off Humalog due to the low blood sugars.  We  discussed that for type 1 diabetes, we will definitely need to cover her meals with rapid acting insulin, but  the amount of insulin timing of these will be paramount to avoid hypo or hyperglycemia after meals.  Reviewing her blood sugars at home, there is no clear trend.  For now, I advised her to stay on the same dose of Lantus but add a low dose Humalog before meals, between 2 to 6 units, depending on the size of the meal and the blood sugars before the meal at next visit, we can try to give her an insulin to carb ratio and then a sliding scale. -We also discussed about the importance of bolusing the insulin 15 minutes before meals.  I do suspect that she has a degree of gastroparesis so we may, in the future, as far boluses closer to the meal, but for now, I suggested to inject it 10-15 min before to see how she does with this regimen. -We did discuss about the possibility of restarting an insulin pump, but I would first like to document compliance with visits and the recommended regimen and then proceed with referring her to diabetes education for prepump training.  -For now, however, I would definitely recommend a CGM.  Since she has Medicaid, at this CGM should be covered for her.  I sent the prescription for this to the [facility]. -Also, since her diet is not ideal, we will refer her to nutrition.  I advised her to bring the Dexcom CGM with her if she obtains it from the pharmacy before her nutrition appointment. - I suggested to: Patient Instructions   Please continue: - Lantus 20 units at bedtime  Add: - Humalog 2-4-6 units 10-15 min before meals  Start the Dexcom CGM as soon as possible.  Please schedule an appt with Antonieta Iba with nutrition.  Please return in 1 month with your sugar log.   - Strongly advised her to start checking sugars at different times of the day - check at least 4 times a day, rotating checks - given sugar log and advised how to fill it and to bring  it at next appt, unless she gets the CGM - given foot care handout and explained the principles  - given instructions for hypoglycemia management "15-15 rule"  - advised for yearly eye exams - sent intranasal glucagon kit Rx to pharmacy - advised to get ketone strips - advised to always have Glu tablets with her - advised for a Med-alert bracelet mentioning "type 1 diabetes mellitus". - given instruction Re: exercising and driving in DM1 (pt instructions) - also, given information about sick day rules - she also has autoimmune hypothyroidism.  Her recent TSH was suppressed, but her dose of levothyroxine was decreased by PCP.  Of note, she is taking the levothyroxine after PPI since otherwise, she cannot tolerate any po intake, not the gallbladder. - Return to clinic in 1 mo with sugar log   Philemon Kingdom, MD PhD [facility]

## 2020-04-08 NOTE — Patient Instructions (Signed)
Please continue: - Lantus 20 units at bedtime  Add: - Humalog 2-4-6 units 10-15 min before meals  Start the Dexcom CGM as soon as possible.  Please schedule an appt with Antonieta Iba with nutrition.  Please return in 1 month with your sugar log.   Basic Rules for Patients with Type I Diabetes Mellitus  1. The American Diabetes Association (ADA) recommended targets: - fasting sugar 80-130 - after meal sugar <180 - HbA1C <7%  2. Engage in ?150 min moderate exercise per week  3. Make sure you have ?8h of sleep every night as this helps both blood sugars and your weight.  4. Always keep a sugar log (not only record in your meter) and bring it to all appointments with [name].  5. "15-15 rule" for hypoglycemia: if sugars are low, take 15 g of carbs** ("fast sugar" - e.g. 4 glucose tablets, 4 oz orange juice), wait 15 min, then check sugars again. If still <80, repeat. Continue  until your sugars >80, then eat a normal meal.   6. Teach family members and coworkers to inject glucagon. Have a glucagon set at home and one at work. They should call 911 after using the set.  7. Check sugar before driving. If <100, correct, and only start driving if sugars rise ?100. Check sugar every hour when on a long drive.  8. Check sugar before exercising. If <100, correct, and only start exercising if sugars rise ?100. Check sugar every hour when on a long exercise routine and 1h after you finished exercising.   If >250, check urine for ketones. If you have moderate-large ketones in urine, do not start exercise. Hydrate yourself with clear liquids and correct the high sugar. Recheck sugars and ketones before attempting to exercise.  Be aware that you might need less insulin when exercising.  *intense, short, exercise bursts can increase your sugars, but  *less intense, longer (>1h), exercise routines can decrease your sugars.   9. Make sure you have a MedAlert bracelet or pendant mentioning "Type I  Diabetes Mellitus". If you have a prior episode of severe hypoglycemia or hypoglycemia unawareness, it should also mention this.  10. Please do not walk barefoot. Inspect your feet for sores/cuts and let us know if you have them.  **E.g. of "fast carbs": ? first choice (15 g):  1 tube glucose gel, GlucoPouch 15, 2 oz glucose liquid ? second choice (15-16 g):  3 or 4 glucose tablets (best taken  with water), 15 Dextrose Bits chewable ? third choice (15-20 g):   cup fruit juice,  cup regular soda, 1 cup skim milk,  1 cup sports drink ? fourth choice (15-20 g):  1 small tube [place] gel (not frosting), 2 tbsp raisins, 1 tbsp table sugar,  [place], jelly beans, gum drops - check package for carb amount   (adapted from: Lenice Pressman. "Insulin therapy and hypoglycemia" Endocrinol Metab Clin N Am 2012, 41: 57-87)  Sick Day Rules for Diabetes  Think S-K-I-L-L:  Sugars:  - if glucose >200, check every 3h and drink sugar free liquids  - if glucose <200, drink carb-containing liquids and recheck 30 min later  - if glucose high, correct with insulin  - if sugars <60, initiate hypoglycemia management (take 15 g of fast carbs and check sugars in 15 min  - repeat until sugars remain >100).  Ketones:  When to check ketones?  When glucose >300 x2 if on insulin injections (>300 x 1 if on insulin pump). When  nausea, vomiting, diarrhea, abdominal pain, headache, fever - even if glucose is normal or low - because in this case, you need both glucose and insulin.    - if you have ketone strips for blood >> if ketones are more or equal than 0.6, need to increase insulin - if you have ketone strips for urine >> if ketones are more or equal than "small", need to increase insulin  Insulin: Never skip long acting insulin, even if not eating!   Urine ketones Blood ketones Extra insulin?  no <0.6 no  small 0.6-1.5 Increase dose by 5%  moderate 1.5-3 Increase dose by 10%  large >3 Increase dose by  at least 20%   Liquids: - if glucose >200, check every 3h and drink sugar free liquids  - if glucose <200, small sips of carb-containing liquids (e.g. Ginger ale, Gatorade, juice, etc.)  Let us know!   Call us if: Go to ED if: Call your primary care doctor if:  Sugars >300 for >8h Severe abdominal pain Fever >100F for 24h  Moderate to large  urine ketones or blood ketones >1.5 Difficulty breathing Other chronic diseases flaring up  Vomiting and unable to keep liquids down Signs of dehydration

## 2020-04-10 DIAGNOSIS — I248 Other forms of acute ischemic heart disease: Secondary | ICD-10-CM | POA: Diagnosis not present

## 2020-04-10 DIAGNOSIS — E1022 Type 1 diabetes mellitus with diabetic chronic kidney disease: Secondary | ICD-10-CM | POA: Diagnosis not present

## 2020-04-10 DIAGNOSIS — F331 Major depressive disorder, recurrent, moderate: Secondary | ICD-10-CM | POA: Diagnosis not present

## 2020-04-10 DIAGNOSIS — G9341 Metabolic encephalopathy: Secondary | ICD-10-CM | POA: Diagnosis not present

## 2020-04-10 DIAGNOSIS — D631 Anemia in chronic kidney disease: Secondary | ICD-10-CM | POA: Diagnosis not present

## 2020-04-10 DIAGNOSIS — I4891 Unspecified atrial fibrillation: Secondary | ICD-10-CM | POA: Diagnosis not present

## 2020-04-10 DIAGNOSIS — N184 Chronic kidney disease, stage 4 (severe): Secondary | ICD-10-CM | POA: Diagnosis not present

## 2020-04-10 DIAGNOSIS — J4540 Moderate persistent asthma, uncomplicated: Secondary | ICD-10-CM | POA: Diagnosis not present

## 2020-04-10 DIAGNOSIS — E1065 Type 1 diabetes mellitus with hyperglycemia: Secondary | ICD-10-CM | POA: Diagnosis not present

## 2020-04-18 DIAGNOSIS — I248 Other forms of acute ischemic heart disease: Secondary | ICD-10-CM | POA: Diagnosis not present

## 2020-04-18 DIAGNOSIS — N184 Chronic kidney disease, stage 4 (severe): Secondary | ICD-10-CM | POA: Diagnosis not present

## 2020-04-18 DIAGNOSIS — D631 Anemia in chronic kidney disease: Secondary | ICD-10-CM | POA: Diagnosis not present

## 2020-04-18 DIAGNOSIS — E1022 Type 1 diabetes mellitus with diabetic chronic kidney disease: Secondary | ICD-10-CM | POA: Diagnosis not present

## 2020-04-18 DIAGNOSIS — G9341 Metabolic encephalopathy: Secondary | ICD-10-CM | POA: Diagnosis not present

## 2020-04-18 DIAGNOSIS — E1065 Type 1 diabetes mellitus with hyperglycemia: Secondary | ICD-10-CM | POA: Diagnosis not present

## 2020-04-18 DIAGNOSIS — I4891 Unspecified atrial fibrillation: Secondary | ICD-10-CM | POA: Diagnosis not present

## 2020-04-18 DIAGNOSIS — J4540 Moderate persistent asthma, uncomplicated: Secondary | ICD-10-CM | POA: Diagnosis not present

## 2020-04-18 DIAGNOSIS — F331 Major depressive disorder, recurrent, moderate: Secondary | ICD-10-CM | POA: Diagnosis not present

## 2020-04-23 DIAGNOSIS — J4540 Moderate persistent asthma, uncomplicated: Secondary | ICD-10-CM | POA: Diagnosis not present

## 2020-04-23 DIAGNOSIS — N184 Chronic kidney disease, stage 4 (severe): Secondary | ICD-10-CM | POA: Diagnosis not present

## 2020-04-23 DIAGNOSIS — D631 Anemia in chronic kidney disease: Secondary | ICD-10-CM | POA: Diagnosis not present

## 2020-04-23 DIAGNOSIS — E1065 Type 1 diabetes mellitus with hyperglycemia: Secondary | ICD-10-CM | POA: Diagnosis not present

## 2020-04-23 DIAGNOSIS — F331 Major depressive disorder, recurrent, moderate: Secondary | ICD-10-CM | POA: Diagnosis not present

## 2020-04-23 DIAGNOSIS — I248 Other forms of acute ischemic heart disease: Secondary | ICD-10-CM | POA: Diagnosis not present

## 2020-04-23 DIAGNOSIS — G9341 Metabolic encephalopathy: Secondary | ICD-10-CM | POA: Diagnosis not present

## 2020-04-23 DIAGNOSIS — I4891 Unspecified atrial fibrillation: Secondary | ICD-10-CM | POA: Diagnosis not present

## 2020-04-23 DIAGNOSIS — E1022 Type 1 diabetes mellitus with diabetic chronic kidney disease: Secondary | ICD-10-CM | POA: Diagnosis not present

## 2020-04-26 DIAGNOSIS — I10 Essential (primary) hypertension: Secondary | ICD-10-CM | POA: Diagnosis not present

## 2020-04-26 DIAGNOSIS — E7849 Other hyperlipidemia: Secondary | ICD-10-CM | POA: Diagnosis not present

## 2020-04-26 DIAGNOSIS — E1065 Type 1 diabetes mellitus with hyperglycemia: Secondary | ICD-10-CM | POA: Diagnosis not present

## 2020-04-26 DIAGNOSIS — Z794 Long term (current) use of insulin: Secondary | ICD-10-CM | POA: Diagnosis not present

## 2020-04-30 DIAGNOSIS — R1110 Vomiting, unspecified: Secondary | ICD-10-CM | POA: Diagnosis not present

## 2020-04-30 DIAGNOSIS — E162 Hypoglycemia, unspecified: Secondary | ICD-10-CM | POA: Diagnosis not present

## 2020-05-02 DIAGNOSIS — R112 Nausea with vomiting, unspecified: Secondary | ICD-10-CM | POA: Diagnosis not present

## 2020-05-02 DIAGNOSIS — E119 Type 2 diabetes mellitus without complications: Secondary | ICD-10-CM | POA: Diagnosis not present

## 2020-05-02 DIAGNOSIS — J45909 Unspecified asthma, uncomplicated: Secondary | ICD-10-CM | POA: Diagnosis not present

## 2020-05-02 DIAGNOSIS — E109 Type 1 diabetes mellitus without complications: Secondary | ICD-10-CM | POA: Diagnosis not present

## 2020-05-02 DIAGNOSIS — K219 Gastro-esophageal reflux disease without esophagitis: Secondary | ICD-10-CM | POA: Diagnosis not present

## 2020-05-02 DIAGNOSIS — E039 Hypothyroidism, unspecified: Secondary | ICD-10-CM | POA: Diagnosis not present

## 2020-05-02 DIAGNOSIS — R109 Unspecified abdominal pain: Secondary | ICD-10-CM | POA: Diagnosis not present

## 2020-05-02 DIAGNOSIS — R1011 Right upper quadrant pain: Secondary | ICD-10-CM | POA: Diagnosis not present

## 2020-05-02 DIAGNOSIS — I4891 Unspecified atrial fibrillation: Secondary | ICD-10-CM | POA: Diagnosis not present

## 2020-05-14 ENCOUNTER — Ambulatory Visit: Payer: Medicare HMO | Admitting: Internal Medicine

## 2020-05-14 DIAGNOSIS — E039 Hypothyroidism, unspecified: Secondary | ICD-10-CM | POA: Diagnosis not present

## 2020-05-14 NOTE — Progress Notes (Incomplete)
Patient ID: Kathleen Norris, female   DOB: 04/01/1965, 55 y.o.   MRN: [id number]   This visit occurred during the SARS-CoV-2 public health emergency.  Safety protocols were in place, including screening questions prior to the visit, additional usage of staff PPE, and extensive cleaning of exam room while observing appropriate contact time as indicated for disinfecting solutions.   HPI: Kathleen Norris is a 55 y.o.-year-old female, referred by her PCP, Dr. Quillian Quince, for management of DM1, diagnosed in 1990s, uncontrolled, with complications (many DKA episodes, hypoglycemia, peripheral neuropathy).  She previously saw Dr. Dorris Fetch.  He no showed 3 initial visit appointments with him.  She also no showed an initial appointment with me.  She now returns 1.5 months after our previous visit.  At today's visit, she is here with her friend who drove her here, as she is not driving due to very labile blood sugars.   DM1: She has a history of innumerable DKA episodes.  Reviewed HbA1c levels: 02/12/2020: HbA1c 11.8% Lab Results  Component Value Date   HGBA1C 13.2 (H) 08/27/2016   HGBA1C 10.3 (A) 09/06/2013   HGBA1C 10.5 % 08/28/2012   At last visit she was on: -Lantus 100 >> 20 units daily (decreased 1 week ago)  Previously on: -Lispro >> stopped b/c hypoglycemia 02/2020 -Metformin 1000 mg twice a day -Januvia 100 mg in a.m.  We changed to:    Meter:  Accu-Chek guide.  She had an insulin pump before but kept dropping sugars too low >> stopped years ago.   Pt checks her sugars 5-6x a day and they are: - am: 109-HI - 30-45 min after b'fast: 46-HI - before lunch: 26-376 - 2h after lunch: n/c - before dinner: 46-580 - 2h after dinner: n/c - bedtime: 100-280 - nighttime: 20s-HI Lowest sugar was 3, 6 (!!!!) 4 mo ago, 27, 32; she does not have hypoglycemia awareness! She has an expired glucagon kit at home. + previous hypoglycemia admissions. Milk is the only thing that works raising her  [place]. Highest sugar was HI. + MANY previous DKA admissions.    Pt's meals are: - Breakfast: may skip 90% b'fast b/c of nausea, [place] sandwich or eggs - Lunch:occasionally skips; TV dinners, Lean Cuisine - Dinner: occasionally skips; TV dinners, Lean Cuisine - Snacks: no She lost ~100 lbs in 3 months 2/2 nausea and vomiting.     - no CKD, last BUN/creatinine:  03/30/2020: 18/1.04, glucose 271 02/12/2020: ACR 33 Lab Results  Component Value Date   BUN 16 08/27/2016   BUN 11 08/28/2012   CREATININE 0.78 08/27/2016   CREATININE 0.67 08/28/2012  On lisinopril 10 mg daily.  -+ HL; last set of lipids: 02/12/2020: 116/171/48/40  Lab Results  Component Value Date   CHOL 187 08/28/2012   HDL 60 08/28/2012   LDLCALC 116 (H) 08/28/2012   TRIG 56 08/28/2012  On atorvastatin 80 mg daily.  - last eye exam was in 2020: No DR reportedly  - + numbness and tingling in her feet.  She was taken off Neurontin and Topamax.  She sees podiatry.  She also has uncontrolled Hashimoto's hypothyroidism:  She is on levothyroxine 175 mcg daily, decreased 04/07/2020 by PCP: - in am - fasting - at least 30 min from b'fast - no calcium - no iron - no multivitamins -+ PPIs -first dose 4 hours before levothyroxine since otherwise she cannot tolerate even water due to nausea - not on Biotin  Reviewed TFTs: 03/30/2020: TSH 0.012 02/12/2020: TSH 4.08  Lab Results  Component Value Date   TSH 22.354 (H) 08/28/2012   Pt has FH of DM in M, MGF.  She had cardioversions in the past >> she will need to see cardiology again.  She has a history of HTN.  ROS: Constitutional: no weight gain/no weight loss, no fatigue, no subjective hyperthermia, no subjective hypothermia, + poor sleep Eyes: no blurry vision, no xerophthalmia ENT: no sore throat, no nodules palpated in neck, no dysphagia, no odynophagia, no hoarseness Cardiovascular: no CP/no SOB/no palpitations/no leg swelling Respiratory: no cough/no  SOB/no wheezing Gastrointestinal: + N/no V/no D/no C/+ acid reflux Musculoskeletal: + Muscle Gerkin joint aches Skin: no rashes, no hair loss Neurological: no tremors/no numbness/no tingling/no dizziness  I reviewed pt's medications, allergies, PMH, social hx, family hx, and changes were documented in the history of present illness. Otherwise, unchanged from my initial visit note.  Past Medical History:  Diagnosis Date  . Arthritis   . Asthma   . Chronic anxiety   . Chronic pain   . Diabetes mellitus    Diagnosed 1997  . Diabetes with ketoacidosis   . GERD (gastroesophageal reflux disease)   . Sinusitis   . SVT (supraventricular tachycardia) ([place])    Ablation per Dr. Lovena Le in November 2005  . URI (upper respiratory infection)    Past Surgical History:  Procedure Laterality Date  . ANTERIOR CERVICAL DECOMP/DISCECTOMY FUSION N/A 09/03/2016   Procedure: ANTERIOR CERVICAL DECOMPRESSION FUSION  - CERVICAL FOUR-FIVE - CERVICAL FIVE-SIX;  Surgeon: Earnie Larsson, MD;  Location: [place];  Service: Neurosurgery;  Laterality: N/A;  . LEFT HEART CATHETERIZATION WITH CORONARY ANGIOGRAM N/A 02/16/2011   Procedure: LEFT HEART CATHETERIZATION WITH CORONARY ANGIOGRAM;  Surgeon: Burnell Blanks, MD;  Location[facility] C[facility] LAB;  Service: Cardiovascular;  Laterality: N/A;  . TUBAL LIGATION     Social History   Socioeconomic History  . Marital status: Married    Spouse name: Not on file  . Number of children: 4  . Years of education: Not on file  . Highest education level: Not on file  Occupational History  . Occupation: Unemployed-Disability  Tobacco Use  . Smoking status: Never Smoker  . Smokeless tobacco: Never Used  Substance and Sexual Activity  . Alcohol use: No  . Drug use: No  . Sexual activity: Not on file  Other Topics Concern  . Not on file  Social History Narrative  . Not on file   Social Determinants of Health   Financial Resource Strain: Not on file  Food Insecurity:  Not on file  Transportation Needs: Not on file  Physical Activity: Not on file  Stress: Not on file  Social Connections: Not on file  Intimate Partner Violence: Not on file   Current Outpatient Medications on File Prior to Visit  Medication Sig Dispense Refill  . albuterol (PROVENTIL HFA;VENTOLIN HFA) 108 (90 BASE) MCG/ACT inhaler Inhale 2 puffs into the lungs every 6 (six) hours as needed for wheezing. 1 Inhaler 2  . ALPRAZolam (XANAX) 0.5 MG tablet TAKE ONE TABLET TWICE DAILY AS NEEDED (Patient taking differently: TAKE ONE TABLET THREE TIMES DAILY) 60 tablet 0  . aspirin 81 MG tablet Take 81 mg by mouth daily.     Marland Kitchen atorvastatin (LIPITOR) 80 MG tablet Take 80 mg by mouth daily.    . Blood Glucose Monitoring Suppl (BAYER CONTOUR MONITOR) W/DEVICE KIT by Does not apply route.    . citalopram (CELEXA) 20 MG tablet Take 20 mg by mouth daily.    Marland Kitchen  Continuous Blood Gluc Transmit (DEXCOM G6 TRANSMITTER) MISC 1 Device by Does not apply route every 3 (three) months. 1 each 3  . Glucagon 3 MG/DOSE POWD Place 3 mg into the nose once as needed for up to 1 dose. 1 each 11  . glucose blood (PRODIGY AUTOCODE TEST) test strip 1 each by Other route 3 (three) times daily. Use as instructed     . ibuprofen (ADVIL,MOTRIN) 200 MG tablet Take 400 mg by mouth every 6 (six) hours as needed for mild pain or moderate pain.    Marland Kitchen insulin glargine (LANTUS) 100 UNIT/ML injection Inject 20 Units into the skin at bedtime. As of 04/08/20 pt taking 20 units daily    . insulin lispro (HUMALOG) 100 UNIT/ML injection Inject 0.02-0.06 mLs (2-6 Units total) into the skin 3 (three) times daily before meals. Pens, please. 30 mL 3  . levothyroxine (SYNTHROID, LEVOTHROID) 200 MCG tablet Take 200 mcg by mouth daily before breakfast.    . metFORMIN (GLUCOPHAGE) 500 MG tablet Take 500 mg by mouth 4 (four) times daily -  before meals and at bedtime.  (Patient not taking: Reported on 04/08/2020)    . naproxen (NAPROSYN) 500 MG tablet Take 1  tablet (500 mg total) by mouth 2 (two) times daily with a meal. (Patient taking differently: Take 500 mg by mouth 3 (three) times daily with meals. ) 60 tablet 1  . naproxen sodium (ANAPROX) 220 MG tablet Take 220 mg by mouth 3 (three) times daily as needed (pain).    Marland Kitchen omeprazole (PRILOSEC) 40 MG capsule Take 1 capsule (40 mg total) by mouth daily. (Patient taking differently: Take 40 mg by mouth 2 (two) times daily. ) 30 capsule 4  . pantoprazole (PROTONIX) 40 MG tablet Take 40 mg by mouth 2 (two) times daily.    Marland Kitchen PRODIGY LANCETS 21G MISC by Does not apply route 3 (three) times daily.       No current facility-administered medications on file prior to visit.   No Known Allergies Family History  Problem Relation Age of Onset  . Heart disease Maternal Uncle   . Diabetes Mother   . Cancer Mother    PE: There were no vitals taken for this visit. Wt Readings from Last 3 Encounters:  04/08/20 171 lb 12.8 oz (77.9 kg)  09/04/16 181 lb (82.1 kg)  08/27/16 181 lb (82.1 kg)   Constitutional: Slightly overweight, in NAD Eyes: PERRLA, EOMI, no exophthalmos ENT: moist mucous membranes, no thyromegaly, no cervical lymphadenopathy Cardiovascular: RRR, No MRG Respiratory: CTA B Gastrointestinal: abdomen soft, NT, ND, BS+ Musculoskeletal: no deformities, strength intact in all 4 Skin: moist, warm, no rashes Neurological: no tremor with outstretched hands, DTR normal in all 4  ASSESSMENT: 1. DM1, uncontrolled, with complications - innumerable DKA episodes - h/o severe hypoglycemia - PN  2.  Hashimoto's hypothyroidism  PLAN:  1. Patient with    long-standing, very brittle, uncontrolled DM1.  In the last few years, she started to have severe nausea and had many admissions for DKA.  She has been on insulin pump in the past but she tells me that she had to come off due to hypoglycemia. -Per patient's report was recently decreased from 70 units to 20 units, due to hypoglycemia episodes.   However, more recently, her blood sugars remain very fluctuating, between 20-HI. -She tells me that she was recently taken off Humalog due to the low blood sugars.  We discussed that for type 1 diabetes, we will definitely  need to cover her meals with rapid acting insulin, but the amount of insulin timing of these will be paramount to avoid hypo or hyperglycemia after meals.  Reviewing her blood sugars at home, there is no clear trend.  For now, I advised her to stay on the same dose of Lantus but add a low dose Humalog before meals, between 2 to 6 units, depending on the size of the meal and the blood sugars before the meal at next visit, we can try to give her an insulin to carb ratio and then a sliding scale. -We also discussed about the importance of bolusing the insulin 15 minutes before meals.  I do suspect that she has a degree of gastroparesis so we may, in the future, as far boluses closer to the meal, but for now, I suggested to inject it 10-15 min before to see how she does with this regimen. -We did discuss about the possibility of restarting an insulin pump, but I would first like to document compliance with visits and the recommended regimen and then proceed with referring her to diabetes education for prepump training.  -For now, however, I would definitely recommend a CGM.  Since she has Medicaid, at this CGM should be covered for her.  I sent the prescription for this to the [facility]. -Also, since her diet is not ideal, we will refer her to nutrition.  I advised her to bring the Dexcom CGM with her if she obtains it from the pharmacy before her nutrition appointment.   - I suggested to: Patient Instructions  Please continue: - Lantus 20 units at bedtime - Humalog 2-4-6 units 10-15 min before meals  Start the Dexcom CGM as soon as possible.  Please schedule an appt with Antonieta Iba with nutrition.  Please return in 1.5 month with your sugar log.   - we checked her  HbA1c: 7%  - advised to check sugars at different times of the day - 4x a day, rotating check times - advised for yearly eye exams >> she is not UTD - return to clinic in 1.5 months  2.  Hashimoto's hypothyroidism - latest thyroid labs reviewed with pt >> TSH was suppressed before last visit, after which her levothyroxine dose was decreased by PCP  - she continues on LT4 *** mcg daily - pt feels good on this dose. - we discussed about taking the thyroid hormone every day, with water, >30 minutes before breakfast, separated by >4 hours from acid reflux medications, calcium, iron, multivitamins. Pt. is not taking it correctly-she needs to take levothyroxine after PPIs, since otherwise, she cannot tolerate p.o. intake - will check thyroid tests today: TSH and fT4 - If labs are abnormal, she will need to return for repeat TFTs in 1.5 months    Philemon Kingdom, MD PhD [facility]

## 2020-05-15 ENCOUNTER — Ambulatory Visit: Payer: Medicare HMO | Admitting: Dietician

## 2020-05-19 DIAGNOSIS — E118 Type 2 diabetes mellitus with unspecified complications: Secondary | ICD-10-CM | POA: Insufficient documentation

## 2020-05-19 DIAGNOSIS — I480 Paroxysmal atrial fibrillation: Secondary | ICD-10-CM | POA: Insufficient documentation

## 2020-05-19 NOTE — Progress Notes (Deleted)
Cardiology Office Note   Date:  05/19/2020   ID:  Kathleen, Norris 1965-10-13, MRN [id number]  PCP:  Caryl Bis, MD  Cardiologist:   No primary care provider on file. Referring:  ***  No chief complaint on file.     History of Present Illness: Kathleen Norris is a 55 y.o. female who is referred by *** for evaluation of PAF.  ***  She had ablation by Dr. Lovena Le in 2005.  She saw Dr. Angelena Form in 2012 for chest pain.  She had a low risk perfusion study and a lowr risk Lexiscan Myoview.  ***    with h/o DM, SVT s/p ablation per Dr. Lovena Le in 2005 who is here today for cardiac follow up. I saw her as a new patient one month ago for w/u of chest pain.  She was hospitalized at [facility] in [place], [place] with diabetic ketoacidosis in September 2012.  She had some chest pain during that admission and at her f/u appt in [place] Worthy Keeler, PA-C), she asked for cardiology referral.  She told  me that she has daily chest pain. The pain seems to worsen with exertion. This is described as a pressure. No radiation. Her left arm goes numb at times. Occasional SOB. She also has occasional palpitations. No prolonged runs of tachycardia. She has been on a PPI in the past for possible GERD but no help with her chest pressure. I ordered an echo which was normal. Her stress myoview showed possible apical ischemia with normal LVEF and wall motion.    Past Medical History:  Diagnosis Date  . Arthritis   . Asthma   . Chronic anxiety   . Chronic pain   . Diabetes mellitus    Diagnosed 1997  . Diabetes with ketoacidosis   . GERD (gastroesophageal reflux disease)   . Sinusitis   . SVT (supraventricular tachycardia) ([place])    Ablation per Dr. Lovena Le in November 2005  . URI (upper respiratory infection)     Past Surgical History:  Procedure Laterality Date  . ANTERIOR CERVICAL DECOMP/DISCECTOMY FUSION N/A 09/03/2016   Procedure: ANTERIOR CERVICAL DECOMPRESSION  FUSION  - CERVICAL FOUR-FIVE - CERVICAL FIVE-SIX;  Surgeon: Earnie Larsson, MD;  Location: [place];  Service: Neurosurgery;  Laterality: N/A;  . LEFT HEART CATHETERIZATION WITH CORONARY ANGIOGRAM N/A 02/16/2011   Procedure: LEFT HEART CATHETERIZATION WITH CORONARY ANGIOGRAM;  Surgeon: Burnell Blanks, MD;  Location: [facility] CATH LAB;  Service: Cardiovascular;  Laterality: N/A;  . TUBAL LIGATION       Current Outpatient Medications  Medication Sig Dispense Refill  . albuterol (PROVENTIL HFA;VENTOLIN HFA) 108 (90 BASE) MCG/ACT inhaler Inhale 2 puffs into the lungs every 6 (six) hours as needed for wheezing. 1 Inhaler 2  . ALPRAZolam (XANAX) 0.5 MG tablet TAKE ONE TABLET TWICE DAILY AS NEEDED (Patient taking differently: TAKE ONE TABLET THREE TIMES DAILY) 60 tablet 0  . aspirin 81 MG tablet Take 81 mg by mouth daily.     Marland Kitchen atorvastatin (LIPITOR) 80 MG tablet Take 80 mg by mouth daily.    . Blood Glucose Monitoring Suppl (BAYER CONTOUR MONITOR) W/DEVICE KIT by Does not apply route.    . citalopram (CELEXA) 20 MG tablet Take 20 mg by mouth daily.    . Continuous Blood Gluc Transmit (DEXCOM G6 TRANSMITTER) MISC 1 Device by Does not apply route every 3 (three) months. 1 each 3  . Glucagon 3 MG/DOSE POWD Place 3  mg into the nose once as needed for up to 1 dose. 1 each 11  . glucose blood (PRODIGY AUTOCODE TEST) test strip 1 each by Other route 3 (three) times daily. Use as instructed     . ibuprofen (ADVIL,MOTRIN) 200 MG tablet Take 400 mg by mouth every 6 (six) hours as needed for mild pain or moderate pain.    Marland Kitchen insulin glargine (LANTUS) 100 UNIT/ML injection Inject 20 Units into the skin at bedtime. As of 04/08/20 pt taking 20 units daily    . insulin lispro (HUMALOG) 100 UNIT/ML injection Inject 0.02-0.06 mLs (2-6 Units total) into the skin 3 (three) times daily before meals. Pens, please. 30 mL 3  . levothyroxine (SYNTHROID, LEVOTHROID) 200 MCG tablet Take 200 mcg by mouth daily before breakfast.    .  metFORMIN (GLUCOPHAGE) 500 MG tablet Take 500 mg by mouth 4 (four) times daily -  before meals and at bedtime.  (Patient not taking: Reported on 04/08/2020)    . naproxen (NAPROSYN) 500 MG tablet Take 1 tablet (500 mg total) by mouth 2 (two) times daily with a meal. (Patient taking differently: Take 500 mg by mouth 3 (three) times daily with meals. ) 60 tablet 1  . naproxen sodium (ANAPROX) 220 MG tablet Take 220 mg by mouth 3 (three) times daily as needed (pain).    Marland Kitchen omeprazole (PRILOSEC) 40 MG capsule Take 1 capsule (40 mg total) by mouth daily. (Patient taking differently: Take 40 mg by mouth 2 (two) times daily. ) 30 capsule 4  . pantoprazole (PROTONIX) 40 MG tablet Take 40 mg by mouth 2 (two) times daily.    Marland Kitchen PRODIGY LANCETS 21G MISC by Does not apply route 3 (three) times daily.       No current facility-administered medications for this visit.    Allergies:   Patient has no known allergies.    Social History:  The patient  reports that she has never smoked. She has never used smokeless tobacco. She reports that she does not drink alcohol and does not use drugs.   Family History:  The patient's ***family history includes Cancer in her mother; Diabetes in her mother; Heart disease in her maternal uncle.    ROS:  Please see the history of present illness.   Otherwise, review of systems are positive for {NONE DEFAULTED:18576::"none"}.   All other systems are reviewed and negative.    PHYSICAL EXAM: VS:  There were no vitals taken for this visit. , BMI There is no height or weight on file to calculate BMI. GENERAL:  Well appearing HEENT:  Pupils equal round and reactive, fundi not visualized, oral mucosa unremarkable NECK:  No jugular venous distention, waveform within normal limits, carotid upstroke brisk and symmetric, no bruits, no thyromegaly LYMPHATICS:  No cervical, inguinal adenopathy LUNGS:  Clear to auscultation bilaterally BACK:  No CVA tenderness CHEST:   Unremarkable HEART:  PMI not displaced or sustained,S1 and S2 within normal limits, no S3, no S4, no clicks, no rubs, *** murmurs ABD:  Flat, positive bowel sounds normal in frequency in pitch, no bruits, no rebound, no guarding, no midline pulsatile mass, no hepatomegaly, no splenomegaly EXT:  2 plus pulses throughout, no edema, no cyanosis no clubbing SKIN:  No rashes no nodules NEURO:  Cranial nerves II through XII grossly intact, motor grossly intact throughout PSYCH:  Cognitively intact, oriented to person place and time    EKG:  EKG {ACTION; IS/IS JAS:[id number]} ordered today. The ekg ordered today demonstrates ***  Recent Labs: No results found for requested labs within last 8760 hours.    Lipid Panel    Component Value Date/Time   CHOL 187 08/28/2012 1048   TRIG 56 08/28/2012 1048   HDL 60 08/28/2012 1048   LDLCALC 116 (H) 08/28/2012 1048      Wt Readings from Last 3 Encounters:  04/08/20 171 lb 12.8 oz (77.9 kg)  09/04/16 181 lb (82.1 kg)  08/27/16 181 lb (82.1 kg)      Other studies Reviewed: Additional studies/ records that were reviewed today include: ***. Review of the above records demonstrates:  Please see elsewhere in the note.  ***   ASSESSMENT AND PLAN:  PAF:  ***  DM:  ***   Current medicines are reviewed at length with the patient today.  The patient {ACTIONS; HAS/DOES NOT HAVE:19233} concerns regarding medicines.  The following changes have been made:  {PLAN; NO CHANGE:13088:s}  Labs/ tests ordered today include: *** No orders of the defined types were placed in this encounter.    Disposition:   FU with ***    Signed, Minus Breeding, MD  05/19/2020 8:21 PM    [facility]

## 2020-05-21 ENCOUNTER — Ambulatory Visit: Payer: Medicare HMO | Admitting: Cardiology

## 2020-05-21 DIAGNOSIS — E118 Type 2 diabetes mellitus with unspecified complications: Secondary | ICD-10-CM

## 2020-05-21 DIAGNOSIS — I480 Paroxysmal atrial fibrillation: Secondary | ICD-10-CM

## 2020-05-26 DIAGNOSIS — Z794 Long term (current) use of insulin: Secondary | ICD-10-CM | POA: Diagnosis not present

## 2020-05-26 DIAGNOSIS — I10 Essential (primary) hypertension: Secondary | ICD-10-CM | POA: Diagnosis not present

## 2020-05-26 DIAGNOSIS — E7849 Other hyperlipidemia: Secondary | ICD-10-CM | POA: Diagnosis not present

## 2020-05-26 DIAGNOSIS — E1065 Type 1 diabetes mellitus with hyperglycemia: Secondary | ICD-10-CM | POA: Diagnosis not present

## 2020-06-04 ENCOUNTER — Ambulatory Visit: Payer: Medicare Other | Admitting: Internal Medicine

## 2020-06-25 DIAGNOSIS — E1065 Type 1 diabetes mellitus with hyperglycemia: Secondary | ICD-10-CM | POA: Diagnosis not present

## 2020-06-25 DIAGNOSIS — I10 Essential (primary) hypertension: Secondary | ICD-10-CM | POA: Diagnosis not present

## 2020-06-25 DIAGNOSIS — Z794 Long term (current) use of insulin: Secondary | ICD-10-CM | POA: Diagnosis not present

## 2020-06-25 DIAGNOSIS — E7849 Other hyperlipidemia: Secondary | ICD-10-CM | POA: Diagnosis not present

## 2020-06-27 DIAGNOSIS — E039 Hypothyroidism, unspecified: Secondary | ICD-10-CM | POA: Diagnosis not present

## 2020-07-22 ENCOUNTER — Encounter: Payer: Self-pay | Admitting: Internal Medicine

## 2020-07-22 ENCOUNTER — Ambulatory Visit: Payer: Medicare Other | Admitting: Internal Medicine

## 2020-07-22 NOTE — Progress Notes (Deleted)
Patient ID: Kathleen Norris, female   DOB: [birth year], 55 y.o.   MRN: [id number]   This visit occurred during the SARS-CoV-2 public health emergency.  Safety protocols were in place, including screening questions prior to the visit, additional usage of staff PPE, and extensive cleaning of exam room while observing appropriate contact time as indicated for disinfecting solutions.   HPI: Kathleen Norris is a 55 y.o.-year-old female, initially referred by her PCP, Dr. Quillian Quince, returning for follow-up for DM1, diagnosed in 1990s, uncontrolled, with complications (many DKA episodes, hypoglycemia, peripheral neuropathy).  She previously saw Dr. Dorris Fetch.  She no showed 3 initial visit appointments with him.  She also no showed an initial appointment with me.  Last visit 3.5 months ago.  Before this visit, she no showed 2 appointments.  Interim history: She continues to come to the visits with a friend, who has to drive her here as she is not driving due to her labile blood sugars. She has a history of significant anemia, with an HbA1c of 8.9 on 03/31/2020.  She continues to feel fatigued.   DM1: She has a history of innumerable DKA episodes.  Last episode was in 03/2020 -admitted at [facility].  Reviewed HbA1c levels: 02/12/2020: HbA1c 11.8% Lab Results  Component Value Date   HGBA1C 13.2 (H) 08/27/2016   HGBA1C 10.3 (A) 09/06/2013   HGBA1C 10.5 % 08/28/2012   At last visit she was on: - Lantus 100 >> 20 units daily  I advised her to change to: - Lantus 20 units at bedtime - Humalog 2-4-6 units 10-15 min before meals  Previously also on: -Lispro >> stopped b/c hypoglycemia 02/2020 -Metformin 1000 mg twice a day -Januvia 100 mg in a.m.  Meter:  Accu-Chek guide.  She had an insulin pump before but kept dropping sugars too low >> stopped years ago.   Pt checks her sugars more than 4 times a day: - am: 109-HI - 30-45 min after b'fast: 46-HI - before lunch: 26-376 - 2h after lunch:  n/c - before dinner: 46-580 - 2h after dinner: n/c - bedtime: 100-280 - nighttime: 20s-HI Lowest sugar was 3, 6 (!!!!) 4 mo ago, 27, 32; she does not have hypoglycemia awareness! She has an expired glucagon kit at home. + previous hypoglycemia admissions. Milk is the only thing that works raising her Dixie. Highest sugar was HI. + MANY previous DKA admissions.    Pt's meals are: - Breakfast: may skip 90% b'fast b/c of nausea, bolonia sandwich or eggs - Lunch:occasionally skips; TV dinners, Lean Cuisine - Dinner: occasionally skips; TV dinners, Lean Cuisine - Snacks: no She lost ~100 lbs in 3 months 2/2 nausea and vomiting.  - no CKD, last BUN/creatinine:  03/30/2020: 18/1.04, glucose 271 02/12/2020: ACR 33 Lab Results  Component Value Date   BUN 16 08/27/2016   BUN 11 08/28/2012   CREATININE 0.78 08/27/2016   CREATININE 0.67 08/28/2012  On lisinopril 10 mg daily.  -+ HL; last set of lipids: 02/12/2020: 116/171/48/40  Lab Results  Component Value Date   CHOL 187 08/28/2012   HDL 60 08/28/2012   LDLCALC 116 (H) 08/28/2012   TRIG 56 08/28/2012  On atorvastatin 80 mg daily.  - last eye exam was in 2020. No DR reportedly.   - + numbness and tingling in her feet.  She was recently taken off Neurontin and Topamax. She will see a podiatrist today.  She also has uncontrolled Hashimoto's hypothyroidism:  Pt is on levothyroxine 175 mcg daily (  decreased 04/07/2020): - in am - fasting - at least 30 min from b'fast - no calcium - no iron - no multivitamins - + PPIs one tab 2x a day - 1st dose 4h before LT4 since OTW she cannot tolerate water 2/2 nausea - not on Biotin  Reviewed her TSH levels: 03/30/2020: TSH 0.012 02/12/2020: TSH 4.08 Lab Results  Component Value Date   TSH 22.354 (H) 08/28/2012   Pt has FH of DM in M, MGF.  She had cardioversions in the past >> she will need to see cardiology again.  She also has a history of HTN.  ROS: Constitutional: no weight gain/no  weight loss, no fatigue, no subjective hyperthermia, no subjective hypothermia, + poor sleep Eyes: no blurry vision, no xerophthalmia ENT: no sore throat, no nodules palpated in neck, no dysphagia, no odynophagia, no hoarseness Cardiovascular: no CP/no SOB/no palpitations/no leg swelling Respiratory: no cough/no SOB/no wheezing Gastrointestinal: + N/no V/no D/no C/+ acid reflux Musculoskeletal: + muscle aches/+ joint aches Skin: no rashes, no hair loss Neurological: no tremors/no numbness/no tingling/no dizziness  I reviewed pt's medications, allergies, PMH, social hx, family hx, and changes were documented in the history of present illness. Otherwise, unchanged from my initial visit note.  Past Medical History:  Diagnosis Date  . Arthritis   . Asthma   . Chronic anxiety   . Chronic pain   . Diabetes mellitus    Diagnosed 1997  . Diabetes with ketoacidosis   . GERD (gastroesophageal reflux disease)   . Sinusitis   . SVT (supraventricular tachycardia) ([place])    Ablation per Dr. Lovena Le in November 2005  . URI (upper respiratory infection)    Past Surgical History:  Procedure Laterality Date  . ANTERIOR CERVICAL DECOMP/DISCECTOMY FUSION N/A 09/03/2016   Procedure: ANTERIOR CERVICAL DECOMPRESSION FUSION  - CERVICAL FOUR-FIVE - CERVICAL FIVE-SIX;  Surgeon: Earnie Larsson, MD;  Location: [place];  Service: Neurosurgery;  Laterality: N/A;  . LEFT HEART CATHETERIZATION WITH CORONARY ANGIOGRAM N/A 02/16/2011   Procedure: LEFT HEART CATHETERIZATION WITH CORONARY ANGIOGRAM;  Surgeon: Burnell Blanks, MD;  Location: [facility] CATH LAB;  Service: Cardiovascular;  Laterality: N/A;  . TUBAL LIGATION     Social History   Socioeconomic History  . Marital status: Married    Spouse name: Not on file  . Number of children: 4  . Years of education: Not on file  . Highest education level: Not on file  Occupational History  . Occupation: Unemployed-Disability  Tobacco Use  . Smoking status: Never  Smoker  . Smokeless tobacco: Never Used  Substance and Sexual Activity  . Alcohol use: No  . Drug use: No  . Sexual activity: Not on file  Other Topics Concern  . Not on file  Social History Narrative  . Not on file   Social Determinants of Health   Financial Resource Strain: Not on file  Food Insecurity: Not on file  Transportation Needs: Not on file  Physical Activity: Not on file  Stress: Not on file  Social Connections: Not on file  Intimate Partner Violence: Not on file   Current Outpatient Medications on File Prior to Visit  Medication Sig Dispense Refill  . albuterol (PROVENTIL HFA;VENTOLIN HFA) 108 (90 BASE) MCG/ACT inhaler Inhale 2 puffs into the lungs every 6 (six) hours as needed for wheezing. 1 Inhaler 2  . ALPRAZolam (XANAX) 0.5 MG tablet TAKE ONE TABLET TWICE DAILY AS NEEDED (Patient taking differently: TAKE ONE TABLET THREE TIMES DAILY) 60 tablet 0  .  aspirin 81 MG tablet Take 81 mg by mouth daily.     Marland Kitchen atorvastatin (LIPITOR) 80 MG tablet Take 80 mg by mouth daily.    . Blood Glucose Monitoring Suppl (BAYER CONTOUR MONITOR) W/DEVICE KIT by Does not apply route.    . citalopram (CELEXA) 20 MG tablet Take 20 mg by mouth daily.    . Continuous Blood Gluc Transmit (DEXCOM G6 TRANSMITTER) MISC 1 Device by Does not apply route every 3 (three) months. 1 each 3  . Glucagon 3 MG/DOSE POWD Place 3 mg into the nose once as needed for up to 1 dose. 1 each 11  . glucose blood (PRODIGY AUTOCODE TEST) test strip 1 each by Other route 3 (three) times daily. Use as instructed     . ibuprofen (ADVIL,MOTRIN) 200 MG tablet Take 400 mg by mouth every 6 (six) hours as needed for mild pain or moderate pain.    Marland Kitchen insulin glargine (LANTUS) 100 UNIT/ML injection Inject 20 Units into the skin at bedtime. As of 04/08/20 pt taking 20 units daily    . insulin lispro (HUMALOG) 100 UNIT/ML injection Inject 0.02-0.06 mLs (2-6 Units total) into the skin 3 (three) times daily before meals. Pens,  please. 30 mL 3  . levothyroxine (SYNTHROID, LEVOTHROID) 200 MCG tablet Take 200 mcg by mouth daily before breakfast.    . metFORMIN (GLUCOPHAGE) 500 MG tablet Take 500 mg by mouth 4 (four) times daily -  before meals and at bedtime.  (Patient not taking: Reported on 04/08/2020)    . naproxen (NAPROSYN) 500 MG tablet Take 1 tablet (500 mg total) by mouth 2 (two) times daily with a meal. (Patient taking differently: Take 500 mg by mouth 3 (three) times daily with meals. ) 60 tablet 1  . naproxen sodium (ANAPROX) 220 MG tablet Take 220 mg by mouth 3 (three) times daily as needed (pain).    Marland Kitchen omeprazole (PRILOSEC) 40 MG capsule Take 1 capsule (40 mg total) by mouth daily. (Patient taking differently: Take 40 mg by mouth 2 (two) times daily. ) 30 capsule 4  . pantoprazole (PROTONIX) 40 MG tablet Take 40 mg by mouth 2 (two) times daily.    Marland Kitchen PRODIGY LANCETS 21G MISC by Does not apply route 3 (three) times daily.       No current facility-administered medications on file prior to visit.   No Known Allergies Family History  Problem Relation Age of Onset  . Heart disease Maternal Uncle   . Diabetes Mother   . Cancer Mother    PE: There were no vitals taken for this visit. Wt Readings from Last 3 Encounters:  04/08/20 171 lb 12.8 oz (77.9 kg)  09/04/16 181 lb (82.1 kg)  08/27/16 181 lb (82.1 kg)   Constitutional: Slightly overweight, in NAD Eyes: PERRLA, EOMI, no exophthalmos ENT: moist mucous membranes, no thyromegaly, no cervical lymphadenopathy Cardiovascular: RRR, No MRG Respiratory: CTA B Gastrointestinal: abdomen soft, NT, ND, BS+ Musculoskeletal: no deformities, strength intact in all 4 Skin: moist, warm, no rashes Neurological: no tremor with outstretched hands, DTR normal in all 4  ASSESSMENT: 1. DM1, uncontrolled, with complications - innumerable DKA episodes - h/o severe hypoglycemia - PN  2.  Hashimoto's hypothyroidism  PLAN:  1. Patient with longstanding, very brittle,  uncontrolled type 1 diabetes.   long-standing, very brittle, uncontrolled DM1.  In the last few years, she started to have severe nausea and had many admissions for DKA.  She has been on insulin pump in the  past but she tells me that she had to come off due to hypoglycemia. -Per patient's report was recently decreased from 70 units to 20 units, due to hypoglycemia episodes.  However, more recently, her blood sugars remain very fluctuating, between 20-HI. -She tells me that she was recently taken off Humalog due to the low blood sugars.  We discussed that for type 1 diabetes, we will definitely need to cover her meals with rapid acting insulin, but the amount of insulin timing of these will be paramount to avoid hypo or hyperglycemia after meals.  Reviewing her blood sugars at home, there is no clear trend.  For now, I advised her to stay on the same dose of Lantus but add a low dose Humalog before meals, between 2 to 6 units, depending on the size of the meal and the blood sugars before the meal at next visit, we can try to give her an insulin to carb ratio and then a sliding scale. -We also discussed about the importance of bolusing the insulin 15 minutes before meals.  I do suspect that she has a degree of gastroparesis so we may, in the future, as far boluses closer to the meal, but for now, I suggested to inject it 10-15 min before to see how she does with this regimen. -We did discuss about the possibility of restarting an insulin pump, but I would first like to document compliance with visits and the recommended regimen and then proceed with referring her to diabetes education for prepump training.  -For now, however, I would definitely recommend a CGM.  Since she has Medicaid, at this CGM should be covered for her.  I sent the prescription for this to the [facility]. -Also, since her diet is not ideal, we will refer her to nutrition.  I advised her to bring the Dexcom CGM with her if she  obtains it from the pharmacy before her nutrition appointment.  -At this visit, we discussed that she missed many more appointments than she had visits so far and if she misses 1 more appointment I will need to discharge her from the practice. -She would like to restart on an insulin pump, however, I would like to see her more compliant with visits before starting her on this. -I also referred her to nutrition at last visit but she did not have this appointment. - I suggested to: Patient Instructions  Please continue: - Lantus 20 units at bedtime - Humalog 2-4-6 units 10-15 min before meals  Start the Dexcom CGM as soon as possible.  Please schedule an appt with Antonieta Iba with nutrition.  Please return in 3 month.   - we checked her HbA1c: 7%  - advised to check sugars at different times of the day - 4x a day, rotating check times - advised for yearly eye exams >> she is UTD - return to clinic in 3 months  2.  Hashimoto's hypothyroidism - latest thyroid labs reviewed with pt >> TSH was suppressed, but levothyroxine dose was decreased by PCP  - she continues on LT4 175 mcg daily - pt feels good on this dose. - we discussed about taking the thyroid hormone every day, with water, >30 minutes before breakfast, separated by >4 hours from acid reflux medications, calcium, iron, multivitamins.  At last visit she was taking levothyroxine after PPI as she was telling me that otherwise she cannot tolerate any p.o. intake.  She continues this practice, although I did advise her that  this is not ideal. - will check thyroid tests today: TSH and fT4 - If labs are abnormal, she will need to return for repeat TFTs in 1.5 months    Philemon Kingdom, MD PhD [facility]

## 2020-07-24 ENCOUNTER — Telehealth: Payer: Self-pay | Admitting: Internal Medicine

## 2020-07-24 NOTE — Telephone Encounter (Signed)
Patient dismissed from [facility] by Philemon Kingdom, MD, effective 07/22/20. Dismissal Letter sent out by 1st class mail. [employer]

## 2020-07-26 DIAGNOSIS — E1065 Type 1 diabetes mellitus with hyperglycemia: Secondary | ICD-10-CM | POA: Diagnosis not present

## 2020-07-26 DIAGNOSIS — E7849 Other hyperlipidemia: Secondary | ICD-10-CM | POA: Diagnosis not present

## 2020-07-26 DIAGNOSIS — I10 Essential (primary) hypertension: Secondary | ICD-10-CM | POA: Diagnosis not present

## 2020-07-26 DIAGNOSIS — Z794 Long term (current) use of insulin: Secondary | ICD-10-CM | POA: Diagnosis not present

## 2020-07-29 DIAGNOSIS — E1065 Type 1 diabetes mellitus with hyperglycemia: Secondary | ICD-10-CM | POA: Diagnosis not present

## 2020-08-16 DIAGNOSIS — E161 Other hypoglycemia: Secondary | ICD-10-CM | POA: Diagnosis not present

## 2020-08-16 DIAGNOSIS — R404 Transient alteration of awareness: Secondary | ICD-10-CM | POA: Diagnosis not present

## 2020-08-16 DIAGNOSIS — E162 Hypoglycemia, unspecified: Secondary | ICD-10-CM | POA: Diagnosis not present

## 2020-08-19 ENCOUNTER — Encounter: Payer: Self-pay | Admitting: Cardiology

## 2020-08-19 ENCOUNTER — Ambulatory Visit (INDEPENDENT_AMBULATORY_CARE_PROVIDER_SITE_OTHER): Payer: Medicare Other | Admitting: Cardiology

## 2020-08-19 VITALS — BP 166/100 | HR 64 | Ht 68.0 in | Wt 193.2 lb

## 2020-08-19 DIAGNOSIS — I10 Essential (primary) hypertension: Secondary | ICD-10-CM | POA: Diagnosis not present

## 2020-08-19 DIAGNOSIS — I480 Paroxysmal atrial fibrillation: Secondary | ICD-10-CM | POA: Diagnosis not present

## 2020-08-19 NOTE — Addendum Note (Signed)
Addended by: Carlyle Dolly F on: 08/19/2020 10:12 AM   Modules accepted: Level of Service

## 2020-08-19 NOTE — Patient Instructions (Addendum)
Medication Instructions:  Continue all current medications.   Labwork: none  Testing/Procedures: none  Follow-Up: 6 months   Any Other Special Instructions Will Be Listed Below (If Applicable).   If you need a refill on your cardiac medications before your next appointment, please call your pharmacy.  

## 2020-08-19 NOTE — Progress Notes (Addendum)
Clinical Summary Kathleen Norris is a 55 y.o.female seen today as a new patient for the following medical problems.    1. PAF -admission in November 6 to 10 2021 with DKA,pH down to 6.8, AKI - elevated trops at the time likely demand ischemia - pcp note mentions she had afib during that admission - discharge summary: "At one point she appeared to have some atrial fibrillation. She had some runs of ventricular tachycardia. She was treated with IV Lopressor. It should be noted that she will go home on Toprol-XL and Eliquis. She has a CHA2DS2-VASc score of around 3" - she was seen by cardiology during admission  - no recent palpitations - compliant with meds     2. SVT  - history of prior ablation in 2005  3. DM 1 - multiple admissions with DKA  4. HTN - compliant with meds, has not taken yet today  Past Medical History:  Diagnosis Date  . Arthritis   . Asthma   . Chronic anxiety   . Chronic pain   . Kathleen mellitus    Diagnosed 1997  . Kathleen with ketoacidosis   . GERD (gastroesophageal reflux disease)   . Sinusitis   . SVT (supraventricular tachycardia) ([place])    Ablation per Dr. Lovena Le in November 2005  . URI (upper respiratory infection)      No Known Allergies   Current Outpatient Medications  Medication Sig Dispense Refill  . albuterol (PROVENTIL HFA;VENTOLIN HFA) 108 (90 BASE) MCG/ACT inhaler Inhale 2 puffs into the lungs every 6 (six) hours as needed for wheezing. 1 Inhaler 2  . ALPRAZolam (XANAX) 0.5 MG tablet TAKE ONE TABLET TWICE DAILY AS NEEDED (Patient taking differently: TAKE ONE TABLET THREE TIMES DAILY) 60 tablet 0  . aspirin 81 MG tablet Take 81 mg by mouth daily.     Marland Kitchen atorvastatin (LIPITOR) 80 MG tablet Take 80 mg by mouth daily.    . Blood Glucose Monitoring Suppl (BAYER CONTOUR MONITOR) W/DEVICE KIT by Does not apply route.    . citalopram (CELEXA) 20 MG tablet Take 20 mg by mouth daily.    . Continuous Blood Gluc Transmit (DEXCOM G6  TRANSMITTER) MISC 1 Device by Does not apply route every 3 (three) months. 1 each 3  . Glucagon 3 MG/DOSE POWD Place 3 mg into the nose once as needed for up to 1 dose. 1 each 11  . glucose blood (PRODIGY AUTOCODE TEST) test strip 1 each by Other route 3 (three) times daily. Use as instructed     . ibuprofen (ADVIL,MOTRIN) 200 MG tablet Take 400 mg by mouth every 6 (six) hours as needed for mild pain or moderate pain.    Marland Kitchen insulin glargine (LANTUS) 100 UNIT/ML injection Inject 20 Units into the skin at bedtime. As of 04/08/20 pt taking 20 units daily    . insulin lispro (HUMALOG) 100 UNIT/ML injection Inject 0.02-0.06 mLs (2-6 Units total) into the skin 3 (three) times daily before meals. Pens, please. 30 mL 3  . levothyroxine (SYNTHROID, LEVOTHROID) 200 MCG tablet Take 200 mcg by mouth daily before breakfast.    . metFORMIN (GLUCOPHAGE) 500 MG tablet Take 500 mg by mouth 4 (four) times daily -  before meals and at bedtime.  (Patient not taking: Reported on 04/08/2020)    . naproxen (NAPROSYN) 500 MG tablet Take 1 tablet (500 mg total) by mouth 2 (two) times daily with a meal. (Patient taking differently: Take 500 mg by mouth 3 (three)  times daily with meals. ) 60 tablet 1  . naproxen sodium (ANAPROX) 220 MG tablet Take 220 mg by mouth 3 (three) times daily as needed (pain).    Marland Kitchen omeprazole (PRILOSEC) 40 MG capsule Take 1 capsule (40 mg total) by mouth daily. (Patient taking differently: Take 40 mg by mouth 2 (two) times daily. ) 30 capsule 4  . pantoprazole (PROTONIX) 40 MG tablet Take 40 mg by mouth 2 (two) times daily.    Marland Kitchen PRODIGY LANCETS 21G MISC by Does not apply route 3 (three) times daily.       No current facility-administered medications for this visit.     Past Surgical History:  Procedure Laterality Date  . ANTERIOR CERVICAL DECOMP/DISCECTOMY FUSION N/A 09/03/2016   Procedure: ANTERIOR CERVICAL DECOMPRESSION FUSION  - CERVICAL FOUR-FIVE - CERVICAL FIVE-SIX;  Surgeon: Kathleen Larsson, MD;   Location: [place];  Service: Neurosurgery;  Laterality: N/A;  . LEFT HEART CATHETERIZATION WITH CORONARY ANGIOGRAM N/A 02/16/2011   Procedure: LEFT HEART CATHETERIZATION WITH CORONARY ANGIOGRAM;  Surgeon: Kathleen Blanks, MD;  Location: [facility] CATH LAB;  Service: Cardiovascular;  Laterality: N/A;  . TUBAL LIGATION       No Known Allergies    Family History  Problem Relation Age of Onset  . Heart disease Kathleen Norris   . Kathleen Norris   . Kathleen Norris      Social History Kathleen Norris reports that she has never smoked. She has never used smokeless tobacco. Kathleen Norris reports no history of alcohol use.   Review of Systems CONSTITUTIONAL: No weight loss, fever, chills, weakness or fatigue.  HEENT: Eyes: No visual loss, blurred vision, double vision or yellow sclerae.No hearing loss, sneezing, congestion, runny nose or sore throat.  SKIN: No rash or itching.  CARDIOVASCULAR: per hpi RESPIRATORY: No shortness of breath, cough or sputum.  GASTROINTESTINAL: No anorexia, nausea, vomiting or diarrhea. No abdominal pain or blood.  GENITOURINARY: No burning on urination, no polyuria NEUROLOGICAL: No headache, dizziness, syncope, paralysis, ataxia, numbness or tingling in the extremities. No change in bowel or bladder control.  MUSCULOSKELETAL: No muscle, back pain, joint pain or stiffness.  LYMPHATICS: No enlarged nodes. No history of splenectomy.  PSYCHIATRIC: No history of depression or anxiety.  ENDOCRINOLOGIC: No reports of sweating, cold or heat intolerance. No polyuria or polydipsia.  Marland Kitchen   Physical Examination Today's Vitals   08/19/20 0942  BP: (!) 166/100  Pulse: 64  SpO2: 98%  Weight: 193 lb 3.2 oz (87.6 kg)  Height: _0  (1.727 m)   Body mass index is 29.38 kg/m.  Gen: resting comfortably, no acute distress HEENT: no scleral icterus, pupils equal round and reactive, no palptable cervical adenopathy,  CV: SWF,0/9 systolic murmur apex, no jvd Resp: Clear to  auscultation bilaterally GI: abdomen is soft, non-tender, non-distended, normal bowel sounds, no hepatosplenomegaly MSK: extremities are warm, no edema.  Skin: warm, no rash Neuro:  no focal deficits Psych: appropriate affect   Diagnostic Studies Echo: 02/01/11:  Left ventricle: The cavity size was normal. Wall thickness was normal. Systolic function was normal. The estimated ejection fraction was in the range of 55% to 60%. Wall motion was normal; there were no regional wall motion abnormalities. Left ventricular diastolic function parameters were normal.   Stress myoview: 02/01/11: Stress Procedure: The patient exercised for 7 minutes and 30 seconds, RPE=15. The patient stopped due to DOE, Fatigue and Chest Pain at baseline 7.5/10 (chronic) with gradual increase at peak exercise to 8/10. There were nonspecific  ST-T wave changes. There was a rare PAC. There was a decrease BP to 148/71 immediately post exercise, without dizziness. Technetium 68mTetrofosmin was injected at peak exercise and myocardial perfusion imaging was performed after a brief delay.  Stress ECG: Significant ST abnormalities consistent with ischemia.  QPS  Raw Data Images: Acquisition technically good; normal left ventricular size.  Stress Images: There is decreased uptake in the distal anterior wall and apex.  Rest Images: There is decreased uptake in the distal anterior wall.  Subtraction (SDS): These findings are consistent with soft tissue attenuation; cannot exclude very mild apical ischemia.  Transient Ischemic Dilatation (Normal <1.22): 1.14  Lung/Heart Ratio (Normal <0.45): .26  Quantitative Gated Spect Images  QGS EDV: 92 ml  QGS ESV: 39 ml  QGS cine images: NL LV Function; NL Wall Motion  QGS EF: 57%  Impression  Exercise Capacity: Fair exercise capacity.  BP Response: Normal blood pressure response.  Clinical Symptoms: There is chest pain.  ECG Impression: Significant ST abnormalities consistent  with ischemia.  Comparison with Prior Nuclear Study: No images to compare  Overall Impression: Low risk stress nuclear study with a small, partially reversible defect in the distal anterior wall and apex consistent with soft tissue attenuation; cannot exclude very mild apical ischemia.   01/2011 cath  Impression: 1. No angiographic evidence of CAD 2. Normal LV systolic function.   [facility]  1. The left ventricle is normal in size with normal wall thickness.  2. The left ventricular systolic function is normal, LVEF is visually  estimated at 60-65%.  3. The left atrium is mildly dilated in size.  4. The right ventricle is upper normal in size, with normal systolic  function.     Assessment and Plan   1. PAF - no symptoms - EKG today shows NSR - continue toprol, eliquis  2. HTN - manural rehceck 148/90, has not taken meds yet today - continue to monitor       JArnoldo Lenis M.D.

## 2020-08-25 DIAGNOSIS — I10 Essential (primary) hypertension: Secondary | ICD-10-CM | POA: Diagnosis not present

## 2020-08-25 DIAGNOSIS — E7849 Other hyperlipidemia: Secondary | ICD-10-CM | POA: Diagnosis not present

## 2020-08-25 DIAGNOSIS — Z794 Long term (current) use of insulin: Secondary | ICD-10-CM | POA: Diagnosis not present

## 2020-08-25 DIAGNOSIS — E1065 Type 1 diabetes mellitus with hyperglycemia: Secondary | ICD-10-CM | POA: Diagnosis not present

## 2020-08-28 DIAGNOSIS — E1065 Type 1 diabetes mellitus with hyperglycemia: Secondary | ICD-10-CM | POA: Diagnosis not present

## 2020-08-29 DIAGNOSIS — Z7901 Long term (current) use of anticoagulants: Secondary | ICD-10-CM | POA: Diagnosis not present

## 2020-08-29 DIAGNOSIS — K219 Gastro-esophageal reflux disease without esophagitis: Secondary | ICD-10-CM | POA: Diagnosis not present

## 2020-08-29 DIAGNOSIS — J4540 Moderate persistent asthma, uncomplicated: Secondary | ICD-10-CM | POA: Diagnosis not present

## 2020-08-29 DIAGNOSIS — R110 Nausea: Secondary | ICD-10-CM | POA: Diagnosis not present

## 2020-08-29 DIAGNOSIS — E039 Hypothyroidism, unspecified: Secondary | ICD-10-CM | POA: Diagnosis not present

## 2020-08-29 DIAGNOSIS — E1110 Type 2 diabetes mellitus with ketoacidosis without coma: Secondary | ICD-10-CM | POA: Diagnosis not present

## 2020-08-29 DIAGNOSIS — E782 Mixed hyperlipidemia: Secondary | ICD-10-CM | POA: Diagnosis not present

## 2020-08-29 DIAGNOSIS — E1165 Type 2 diabetes mellitus with hyperglycemia: Secondary | ICD-10-CM | POA: Diagnosis not present

## 2020-08-29 DIAGNOSIS — Z20822 Contact with and (suspected) exposure to covid-19: Secondary | ICD-10-CM | POA: Diagnosis not present

## 2020-08-29 DIAGNOSIS — E1010 Type 1 diabetes mellitus with ketoacidosis without coma: Secondary | ICD-10-CM | POA: Diagnosis not present

## 2020-08-29 DIAGNOSIS — I10 Essential (primary) hypertension: Secondary | ICD-10-CM | POA: Diagnosis not present

## 2020-08-29 DIAGNOSIS — R112 Nausea with vomiting, unspecified: Secondary | ICD-10-CM | POA: Diagnosis not present

## 2020-08-29 DIAGNOSIS — E1042 Type 1 diabetes mellitus with diabetic polyneuropathy: Secondary | ICD-10-CM | POA: Diagnosis not present

## 2020-08-29 DIAGNOSIS — G43909 Migraine, unspecified, not intractable, without status migrainosus: Secondary | ICD-10-CM | POA: Diagnosis not present

## 2020-08-29 DIAGNOSIS — R000 Tachycardia, unspecified: Secondary | ICD-10-CM | POA: Diagnosis not present

## 2020-09-03 DIAGNOSIS — Z23 Encounter for immunization: Secondary | ICD-10-CM | POA: Diagnosis not present

## 2020-09-03 DIAGNOSIS — R4582 Worries: Secondary | ICD-10-CM | POA: Diagnosis not present

## 2020-09-11 ENCOUNTER — Telehealth: Payer: Self-pay | Admitting: Internal Medicine

## 2020-09-11 NOTE — Telephone Encounter (Signed)
Patient has been seen one time as a new patient in Jan 2022. Patient since then has cancelled once and no showed twice, she said she just got out of the hospital for her sugars and is wanting to schedule an appointment. Please advise.

## 2020-09-11 NOTE — Telephone Encounter (Signed)
I see now that patient has been dismissed. Please disregard last message. I will call Ms Lozo and let her know we are unable to schedule.

## 2020-09-25 DIAGNOSIS — I10 Essential (primary) hypertension: Secondary | ICD-10-CM | POA: Diagnosis not present

## 2020-09-25 DIAGNOSIS — E1065 Type 1 diabetes mellitus with hyperglycemia: Secondary | ICD-10-CM | POA: Diagnosis not present

## 2020-09-25 DIAGNOSIS — Z794 Long term (current) use of insulin: Secondary | ICD-10-CM | POA: Diagnosis not present

## 2020-09-25 DIAGNOSIS — E7849 Other hyperlipidemia: Secondary | ICD-10-CM | POA: Diagnosis not present

## 2020-09-27 DIAGNOSIS — E1065 Type 1 diabetes mellitus with hyperglycemia: Secondary | ICD-10-CM | POA: Diagnosis not present

## 2020-09-30 DIAGNOSIS — J301 Allergic rhinitis due to pollen: Secondary | ICD-10-CM | POA: Diagnosis not present

## 2020-09-30 DIAGNOSIS — E1110 Type 2 diabetes mellitus with ketoacidosis without coma: Secondary | ICD-10-CM | POA: Diagnosis not present

## 2020-10-15 ENCOUNTER — Ambulatory Visit: Payer: Medicaid Other | Admitting: Nurse Practitioner

## 2020-10-27 DIAGNOSIS — E1065 Type 1 diabetes mellitus with hyperglycemia: Secondary | ICD-10-CM | POA: Diagnosis not present

## 2020-11-21 DIAGNOSIS — Z1389 Encounter for screening for other disorder: Secondary | ICD-10-CM | POA: Diagnosis not present

## 2020-11-21 DIAGNOSIS — G43919 Migraine, unspecified, intractable, without status migrainosus: Secondary | ICD-10-CM | POA: Diagnosis not present

## 2020-11-21 DIAGNOSIS — Z0001 Encounter for general adult medical examination with abnormal findings: Secondary | ICD-10-CM | POA: Diagnosis not present

## 2020-11-21 DIAGNOSIS — J4520 Mild intermittent asthma, uncomplicated: Secondary | ICD-10-CM | POA: Diagnosis not present

## 2020-11-21 DIAGNOSIS — E1142 Type 2 diabetes mellitus with diabetic polyneuropathy: Secondary | ICD-10-CM | POA: Diagnosis not present

## 2020-11-21 DIAGNOSIS — E7849 Other hyperlipidemia: Secondary | ICD-10-CM | POA: Diagnosis not present

## 2020-11-21 DIAGNOSIS — I10 Essential (primary) hypertension: Secondary | ICD-10-CM | POA: Diagnosis not present

## 2020-11-26 DIAGNOSIS — E1065 Type 1 diabetes mellitus with hyperglycemia: Secondary | ICD-10-CM | POA: Diagnosis not present

## 2020-11-26 DIAGNOSIS — E7849 Other hyperlipidemia: Secondary | ICD-10-CM | POA: Diagnosis not present

## 2020-11-26 DIAGNOSIS — Z794 Long term (current) use of insulin: Secondary | ICD-10-CM | POA: Diagnosis not present

## 2020-11-26 DIAGNOSIS — I10 Essential (primary) hypertension: Secondary | ICD-10-CM | POA: Diagnosis not present

## 2020-12-26 DIAGNOSIS — E1065 Type 1 diabetes mellitus with hyperglycemia: Secondary | ICD-10-CM | POA: Diagnosis not present

## 2021-02-17 DIAGNOSIS — I10 Essential (primary) hypertension: Secondary | ICD-10-CM | POA: Diagnosis not present

## 2021-02-17 DIAGNOSIS — E1065 Type 1 diabetes mellitus with hyperglycemia: Secondary | ICD-10-CM | POA: Diagnosis not present

## 2021-02-17 DIAGNOSIS — Z23 Encounter for immunization: Secondary | ICD-10-CM | POA: Diagnosis not present

## 2021-02-17 DIAGNOSIS — G43919 Migraine, unspecified, intractable, without status migrainosus: Secondary | ICD-10-CM | POA: Diagnosis not present

## 2021-02-17 DIAGNOSIS — J4520 Mild intermittent asthma, uncomplicated: Secondary | ICD-10-CM | POA: Diagnosis not present

## 2021-02-17 DIAGNOSIS — E1142 Type 2 diabetes mellitus with diabetic polyneuropathy: Secondary | ICD-10-CM | POA: Diagnosis not present

## 2021-02-17 DIAGNOSIS — E782 Mixed hyperlipidemia: Secondary | ICD-10-CM | POA: Diagnosis not present

## 2021-02-17 DIAGNOSIS — E039 Hypothyroidism, unspecified: Secondary | ICD-10-CM | POA: Diagnosis not present

## 2021-02-19 DIAGNOSIS — E1065 Type 1 diabetes mellitus with hyperglycemia: Secondary | ICD-10-CM | POA: Diagnosis not present

## 2021-02-24 ENCOUNTER — Ambulatory Visit: Payer: Medicare Other | Admitting: Cardiology

## 2021-02-24 NOTE — Progress Notes (Deleted)
Clinical Summary Kathleen Norris is a 55 y.o.female seen today as a new patient for the following medical problems.      1. PAF -admission in November 6 to 10 2021 with DKA,pH down to 6.8, AKI - elevated trops at the time likely demand ischemia - pcp note mentions she had afib during that admission - discharge summary: "At one point she appeared to have some atrial fibrillation. She had some runs of ventricular tachycardia. She was treated with IV Lopressor. It should be noted that she will go home on Toprol-XL and Eliquis. She has a CHA2DS2-VASc score of around 3" - she was seen by cardiology during admission   - no recent palpitations - compliant with meds         2. SVT  - history of prior ablation in 2005   3. DM 1 - multiple admissions with DKA   4. HTN - compliant with meds, has not taken yet today     Past Medical History:  Diagnosis Date   Arthritis    Asthma    Chronic anxiety    Chronic pain    Diabetes mellitus    Diagnosed 1997   Diabetes with ketoacidosis    GERD (gastroesophageal reflux disease)    Sinusitis    SVT (supraventricular tachycardia) ([place])    Ablation per Dr. Lovena Le in November 2005   URI (upper respiratory infection)      No Known Allergies   Current Outpatient Medications  Medication Sig Dispense Refill   albuterol (PROVENTIL HFA;VENTOLIN HFA) 108 (90 BASE) MCG/ACT inhaler Inhale 2 puffs into the lungs every 6 (six) hours as needed for wheezing. 1 Inhaler 2   ALPRAZolam (XANAX) 0.5 MG tablet TAKE ONE TABLET TWICE DAILY AS NEEDED 60 tablet 0   atorvastatin (LIPITOR) 80 MG tablet Take 80 mg by mouth daily.     Blood Glucose Monitoring Suppl (BAYER CONTOUR MONITOR) W/DEVICE KIT by Does not apply route.     citalopram (CELEXA) 20 MG tablet Take 20 mg by mouth daily.     Continuous Blood Gluc Transmit (DEXCOM G6 TRANSMITTER) MISC 1 Device by Does not apply route every 3 (three) months. 1 each 3   ELIQUIS 5 MG TABS tablet Take 1 tablet  by mouth 2 (two) times daily.     Glucagon 3 MG/DOSE POWD Place 3 mg into the nose once as needed for up to 1 dose. 1 each 11   glucose blood test strip 1 each by Other route 3 (three) times daily. Use as instructed     ibuprofen (ADVIL,MOTRIN) 200 MG tablet Take 400 mg by mouth every 6 (six) hours as needed for mild pain or moderate pain.     insulin glargine (LANTUS) 100 UNIT/ML injection Inject 20 Units into the skin at bedtime. As of 04/08/20 pt taking 20 units daily     insulin lispro (HUMALOG) 100 UNIT/ML injection Inject 0.02-0.06 mLs (2-6 Units total) into the skin 3 (three) times daily before meals. Pens, please. 30 mL 3   ipratropium-albuterol (DUONEB) 0.5-2.5 (3) MG/3ML SOLN Take 3 mLs by nebulization every 4 (four) hours as needed.     levothyroxine (SYNTHROID) 150 MCG tablet Take 150 mcg by mouth daily.     lisinopril (ZESTRIL) 10 MG tablet Take 1 tablet by mouth daily.     metFORMIN (GLUCOPHAGE) 500 MG tablet Take 500 mg by mouth 4 (four) times daily -  before meals and at bedtime.     metoprolol succinate (  TOPROL-XL) 100 MG 24 hr tablet Take 100 mg by mouth daily.     omeprazole (PRILOSEC) 40 MG capsule Take 1 capsule (40 mg total) by mouth daily. (Patient taking differently: Take 40 mg by mouth 2 (two) times daily.) 30 capsule 4   pantoprazole (PROTONIX) 40 MG tablet Take 40 mg by mouth 2 (two) times daily.     PRODIGY LANCETS 21G MISC by Does not apply route 3 (three) times daily.     topiramate (TOPAMAX) 50 MG tablet Take 50 mg by mouth 2 (two) times daily.     No current facility-administered medications for this visit.     Past Surgical History:  Procedure Laterality Date   ANTERIOR CERVICAL DECOMP/DISCECTOMY FUSION N/A 09/03/2016   Procedure: ANTERIOR CERVICAL DECOMPRESSION FUSION  - CERVICAL FOUR-FIVE - CERVICAL FIVE-SIX;  Surgeon: Earnie Larsson, MD;  Location: [facility];  Service: Neurosurgery;  Laterality: N/A;   LEFT HEART CATHETERIZATION WITH CORONARY ANGIOGRAM N/A 02/16/2011    Procedure: LEFT HEART CATHETERIZATION WITH CORONARY ANGIOGRAM;  Surgeon: Burnell Blanks, MD;  Location: [facility] C[facility] LAB;  Service: Cardiovascular;  Laterality: N/A;   TUBAL LIGATION       No Known Allergies    Family History  Problem Relation Age of Onset   Heart disease Maternal Uncle    Diabetes Mother    Cancer Mother      Social History Ms. Heacox reports that she has never smoked. She has never used smokeless tobacco. Ms. Brockel reports no history of alcohol use.   Review of Systems CONSTITUTIONAL: No weight loss, fever, chills, weakness or fatigue.  HEENT: Eyes: No visual loss, blurred vision, double vision or yellow sclerae.No hearing loss, sneezing, congestion, runny nose or sore throat.  SKIN: No rash or itching.  CARDIOVASCULAR:  RESPIRATORY: No shortness of breath, cough or sputum.  GASTROINTESTINAL: No anorexia, nausea, vomiting or diarrhea. No abdominal pain or blood.  GENITOURINARY: No burning on urination, no polyuria NEUROLOGICAL: No headache, dizziness, syncope, paralysis, ataxia, numbness or tingling in the extremities. No change in bowel or bladder control.  MUSCULOSKELETAL: No muscle, back pain, joint pain or stiffness.  LYMPHATICS: No enlarged nodes. No history of splenectomy.  PSYCHIATRIC: No history of depression or anxiety.  ENDOCRINOLOGIC: No reports of sweating, cold or heat intolerance. No polyuria or polydipsia.  Marland Kitchen   Physical Examination There were no vitals filed for this visit. There were no vitals filed for this visit.  Gen: resting comfortably, no acute distress HEENT: no scleral icterus, pupils equal round and reactive, no palptable cervical adenopathy,  CV Resp: Clear to auscultation bilaterally GI: abdomen is soft, non-tender, non-distended, normal bowel sounds, no hepatosplenomegaly MSK: extremities are warm, no edema.  Skin: warm, no rash Neuro:  no focal deficits Psych: appropriate affect   Diagnostic Studies  Echo:  02/01/11:  Left ventricle: The cavity size was normal. Wall thickness was normal. Systolic function was normal. The estimated ejection fraction was in the range of 55% to 60%. Wall motion was normal; there were no regional wall motion abnormalities. Left ventricular diastolic function parameters were normal.    Stress myoview: 02/01/11: Stress Procedure: The patient exercised for 7 minutes and 30 seconds, RPE=15. The patient stopped due to DOE, Fatigue and Chest Pain at baseline 7.5/10 (chronic) with gradual increase at peak exercise to 8/10. There were nonspecific ST-T wave changes. There was a rare PAC. There was a decrease BP to 148/71 immediately post exercise, without dizziness. Technetium 75m Tetrofosmin was injected at peak exercise  and myocardial perfusion imaging was performed after a brief delay.  Stress ECG: Significant ST abnormalities consistent with ischemia.  QPS  Raw Data Images: Acquisition technically good; normal left ventricular size.  Stress Images: There is decreased uptake in the distal anterior wall and apex.  Rest Images: There is decreased uptake in the distal anterior wall.  Subtraction (SDS): These findings are consistent with soft tissue attenuation; cannot exclude very mild apical ischemia.  Transient Ischemic Dilatation (Normal <1.22): 1.14  Lung/Heart Ratio (Normal <0.45): .26  Quantitative Gated Spect Images  QGS EDV: 92 ml  QGS ESV: 39 ml  QGS cine images: NL LV Function; NL Wall Motion  QGS EF: 57%  Impression  Exercise Capacity: Fair exercise capacity.  BP Response: Normal blood pressure response.  Clinical Symptoms: There is chest pain.  ECG Impression: Significant ST abnormalities consistent with ischemia.  Comparison with Prior Nuclear Study: No images to compare  Overall Impression: Low risk stress nuclear study with a small, partially reversible defect in the distal anterior wall and apex consistent with soft tissue attenuation; cannot exclude  very mild apical ischemia.    01/2011 cath   Impression: 1. No angiographic evidence of CAD 2. Normal LV systolic function.    01/2020 echo [facility] Summary    1. The left ventricle is normal in size with normal wall thickness.    2. The left ventricular systolic function is normal, LVEF is visually  estimated at 60-65%.    3. The left atrium is mildly dilated in size.    4. The right ventricle is upper normal in size, with normal systolic  function.     Assessment and Plan  1. PAF - no symptoms - EKG today shows NSR - continue toprol, eliquis   2. HTN - manural rehceck 148/90, has not taken meds yet today - continue to monitor      Arnoldo Lenis, M.D., F.A.C.C.

## 2021-03-06 DIAGNOSIS — M542 Cervicalgia: Secondary | ICD-10-CM | POA: Diagnosis not present

## 2021-03-21 DIAGNOSIS — E1065 Type 1 diabetes mellitus with hyperglycemia: Secondary | ICD-10-CM | POA: Diagnosis not present

## 2021-05-10 DIAGNOSIS — H5213 Myopia, bilateral: Secondary | ICD-10-CM | POA: Diagnosis not present

## 2021-05-14 DIAGNOSIS — Z794 Long term (current) use of insulin: Secondary | ICD-10-CM | POA: Diagnosis not present

## 2021-05-14 DIAGNOSIS — E109 Type 1 diabetes mellitus without complications: Secondary | ICD-10-CM | POA: Diagnosis not present

## 2021-05-29 DIAGNOSIS — E1065 Type 1 diabetes mellitus with hyperglycemia: Secondary | ICD-10-CM | POA: Diagnosis not present

## 2021-05-29 DIAGNOSIS — E782 Mixed hyperlipidemia: Secondary | ICD-10-CM | POA: Diagnosis not present

## 2021-05-29 DIAGNOSIS — R059 Cough, unspecified: Secondary | ICD-10-CM | POA: Diagnosis not present

## 2021-05-29 DIAGNOSIS — E7849 Other hyperlipidemia: Secondary | ICD-10-CM | POA: Diagnosis not present

## 2021-05-29 DIAGNOSIS — I10 Essential (primary) hypertension: Secondary | ICD-10-CM | POA: Diagnosis not present

## 2021-05-29 DIAGNOSIS — E1142 Type 2 diabetes mellitus with diabetic polyneuropathy: Secondary | ICD-10-CM | POA: Diagnosis not present

## 2021-05-29 DIAGNOSIS — E039 Hypothyroidism, unspecified: Secondary | ICD-10-CM | POA: Diagnosis not present

## 2021-05-29 DIAGNOSIS — J4520 Mild intermittent asthma, uncomplicated: Secondary | ICD-10-CM | POA: Diagnosis not present

## 2021-05-29 DIAGNOSIS — Z23 Encounter for immunization: Secondary | ICD-10-CM | POA: Diagnosis not present

## 2021-05-29 DIAGNOSIS — G43919 Migraine, unspecified, intractable, without status migrainosus: Secondary | ICD-10-CM | POA: Diagnosis not present

## 2021-06-14 DIAGNOSIS — Z794 Long term (current) use of insulin: Secondary | ICD-10-CM | POA: Diagnosis not present

## 2021-06-14 DIAGNOSIS — E109 Type 1 diabetes mellitus without complications: Secondary | ICD-10-CM | POA: Diagnosis not present

## 2021-07-15 DIAGNOSIS — Z794 Long term (current) use of insulin: Secondary | ICD-10-CM | POA: Diagnosis not present

## 2021-07-15 DIAGNOSIS — E109 Type 1 diabetes mellitus without complications: Secondary | ICD-10-CM | POA: Diagnosis not present

## 2021-07-30 DIAGNOSIS — E782 Mixed hyperlipidemia: Secondary | ICD-10-CM | POA: Diagnosis not present

## 2021-07-30 DIAGNOSIS — E875 Hyperkalemia: Secondary | ICD-10-CM | POA: Diagnosis not present

## 2021-07-30 DIAGNOSIS — R112 Nausea with vomiting, unspecified: Secondary | ICD-10-CM | POA: Diagnosis not present

## 2021-07-30 DIAGNOSIS — E1042 Type 1 diabetes mellitus with diabetic polyneuropathy: Secondary | ICD-10-CM | POA: Diagnosis not present

## 2021-07-30 DIAGNOSIS — J4540 Moderate persistent asthma, uncomplicated: Secondary | ICD-10-CM | POA: Diagnosis not present

## 2021-07-30 DIAGNOSIS — Z794 Long term (current) use of insulin: Secondary | ICD-10-CM | POA: Diagnosis not present

## 2021-07-30 DIAGNOSIS — K219 Gastro-esophageal reflux disease without esophagitis: Secondary | ICD-10-CM | POA: Diagnosis not present

## 2021-07-30 DIAGNOSIS — E039 Hypothyroidism, unspecified: Secondary | ICD-10-CM | POA: Diagnosis not present

## 2021-07-30 DIAGNOSIS — M1990 Unspecified osteoarthritis, unspecified site: Secondary | ICD-10-CM | POA: Diagnosis not present

## 2021-07-30 DIAGNOSIS — G43909 Migraine, unspecified, not intractable, without status migrainosus: Secondary | ICD-10-CM | POA: Diagnosis not present

## 2021-07-30 DIAGNOSIS — E1010 Type 1 diabetes mellitus with ketoacidosis without coma: Secondary | ICD-10-CM | POA: Diagnosis not present

## 2021-07-30 DIAGNOSIS — Z20822 Contact with and (suspected) exposure to covid-19: Secondary | ICD-10-CM | POA: Diagnosis not present

## 2021-08-20 DIAGNOSIS — Z794 Long term (current) use of insulin: Secondary | ICD-10-CM | POA: Diagnosis not present

## 2021-08-20 DIAGNOSIS — E109 Type 1 diabetes mellitus without complications: Secondary | ICD-10-CM | POA: Diagnosis not present

## 2021-08-30 DIAGNOSIS — E1142 Type 2 diabetes mellitus with diabetic polyneuropathy: Secondary | ICD-10-CM | POA: Diagnosis not present

## 2021-08-30 DIAGNOSIS — E039 Hypothyroidism, unspecified: Secondary | ICD-10-CM | POA: Diagnosis not present

## 2021-08-30 DIAGNOSIS — K2100 Gastro-esophageal reflux disease with esophagitis, without bleeding: Secondary | ICD-10-CM | POA: Diagnosis not present

## 2021-09-16 DIAGNOSIS — E7849 Other hyperlipidemia: Secondary | ICD-10-CM | POA: Diagnosis not present

## 2021-09-16 DIAGNOSIS — R748 Abnormal levels of other serum enzymes: Secondary | ICD-10-CM | POA: Diagnosis not present

## 2021-09-16 DIAGNOSIS — I10 Essential (primary) hypertension: Secondary | ICD-10-CM | POA: Diagnosis not present

## 2021-09-16 DIAGNOSIS — E1165 Type 2 diabetes mellitus with hyperglycemia: Secondary | ICD-10-CM | POA: Diagnosis not present

## 2021-09-16 DIAGNOSIS — E039 Hypothyroidism, unspecified: Secondary | ICD-10-CM | POA: Diagnosis not present

## 2021-09-16 DIAGNOSIS — K219 Gastro-esophageal reflux disease without esophagitis: Secondary | ICD-10-CM | POA: Diagnosis not present

## 2021-12-01 DIAGNOSIS — Z794 Long term (current) use of insulin: Secondary | ICD-10-CM | POA: Diagnosis not present

## 2021-12-01 DIAGNOSIS — E109 Type 1 diabetes mellitus without complications: Secondary | ICD-10-CM | POA: Diagnosis not present

## 2021-12-10 DIAGNOSIS — E1042 Type 1 diabetes mellitus with diabetic polyneuropathy: Secondary | ICD-10-CM | POA: Diagnosis not present

## 2021-12-10 DIAGNOSIS — E782 Mixed hyperlipidemia: Secondary | ICD-10-CM | POA: Diagnosis not present

## 2021-12-10 DIAGNOSIS — Z794 Long term (current) use of insulin: Secondary | ICD-10-CM | POA: Diagnosis not present

## 2021-12-10 DIAGNOSIS — R059 Cough, unspecified: Secondary | ICD-10-CM | POA: Diagnosis not present

## 2021-12-10 DIAGNOSIS — K219 Gastro-esophageal reflux disease without esophagitis: Secondary | ICD-10-CM | POA: Diagnosis not present

## 2021-12-10 DIAGNOSIS — Z7984 Long term (current) use of oral hypoglycemic drugs: Secondary | ICD-10-CM | POA: Diagnosis not present

## 2021-12-10 DIAGNOSIS — Z825 Family history of asthma and other chronic lower respiratory diseases: Secondary | ICD-10-CM | POA: Diagnosis not present

## 2021-12-10 DIAGNOSIS — I10 Essential (primary) hypertension: Secondary | ICD-10-CM | POA: Diagnosis not present

## 2021-12-10 DIAGNOSIS — E1010 Type 1 diabetes mellitus with ketoacidosis without coma: Secondary | ICD-10-CM | POA: Diagnosis not present

## 2021-12-10 DIAGNOSIS — Z20822 Contact with and (suspected) exposure to covid-19: Secondary | ICD-10-CM | POA: Diagnosis not present

## 2021-12-10 DIAGNOSIS — G43909 Migraine, unspecified, not intractable, without status migrainosus: Secondary | ICD-10-CM | POA: Diagnosis not present

## 2021-12-10 DIAGNOSIS — E039 Hypothyroidism, unspecified: Secondary | ICD-10-CM | POA: Diagnosis not present

## 2021-12-10 DIAGNOSIS — J4541 Moderate persistent asthma with (acute) exacerbation: Secondary | ICD-10-CM | POA: Diagnosis not present

## 2021-12-12 DIAGNOSIS — R059 Cough, unspecified: Secondary | ICD-10-CM | POA: Diagnosis not present

## 2021-12-29 DIAGNOSIS — E1065 Type 1 diabetes mellitus with hyperglycemia: Secondary | ICD-10-CM | POA: Diagnosis not present

## 2022-01-28 DIAGNOSIS — E1065 Type 1 diabetes mellitus with hyperglycemia: Secondary | ICD-10-CM | POA: Diagnosis not present

## 2022-02-12 DIAGNOSIS — E1165 Type 2 diabetes mellitus with hyperglycemia: Secondary | ICD-10-CM | POA: Diagnosis not present

## 2022-02-12 DIAGNOSIS — J4520 Mild intermittent asthma, uncomplicated: Secondary | ICD-10-CM | POA: Diagnosis not present

## 2022-02-12 DIAGNOSIS — M542 Cervicalgia: Secondary | ICD-10-CM | POA: Diagnosis not present

## 2022-02-12 DIAGNOSIS — E039 Hypothyroidism, unspecified: Secondary | ICD-10-CM | POA: Diagnosis not present

## 2022-02-12 DIAGNOSIS — R059 Cough, unspecified: Secondary | ICD-10-CM | POA: Diagnosis not present

## 2022-02-12 DIAGNOSIS — G43919 Migraine, unspecified, intractable, without status migrainosus: Secondary | ICD-10-CM | POA: Diagnosis not present

## 2022-02-12 DIAGNOSIS — I10 Essential (primary) hypertension: Secondary | ICD-10-CM | POA: Diagnosis not present

## 2022-02-12 DIAGNOSIS — Z23 Encounter for immunization: Secondary | ICD-10-CM | POA: Diagnosis not present

## 2022-02-12 DIAGNOSIS — E7849 Other hyperlipidemia: Secondary | ICD-10-CM | POA: Diagnosis not present

## 2022-02-12 DIAGNOSIS — K2100 Gastro-esophageal reflux disease with esophagitis, without bleeding: Secondary | ICD-10-CM | POA: Diagnosis not present

## 2022-02-15 DIAGNOSIS — Z789 Other specified health status: Secondary | ICD-10-CM | POA: Diagnosis not present

## 2022-02-15 DIAGNOSIS — Z0001 Encounter for general adult medical examination with abnormal findings: Secondary | ICD-10-CM | POA: Diagnosis not present

## 2022-02-15 DIAGNOSIS — I10 Essential (primary) hypertension: Secondary | ICD-10-CM | POA: Diagnosis not present

## 2022-02-27 DIAGNOSIS — E1065 Type 1 diabetes mellitus with hyperglycemia: Secondary | ICD-10-CM | POA: Diagnosis not present

## 2022-04-16 DIAGNOSIS — E1065 Type 1 diabetes mellitus with hyperglycemia: Secondary | ICD-10-CM | POA: Diagnosis not present

## 2022-04-20 DIAGNOSIS — N179 Acute kidney failure, unspecified: Secondary | ICD-10-CM | POA: Diagnosis not present

## 2022-04-20 DIAGNOSIS — Z1152 Encounter for screening for COVID-19: Secondary | ICD-10-CM | POA: Diagnosis not present

## 2022-04-20 DIAGNOSIS — E782 Mixed hyperlipidemia: Secondary | ICD-10-CM | POA: Diagnosis not present

## 2022-04-20 DIAGNOSIS — I700 Atherosclerosis of aorta: Secondary | ICD-10-CM | POA: Diagnosis not present

## 2022-04-20 DIAGNOSIS — Z20822 Contact with and (suspected) exposure to covid-19: Secondary | ICD-10-CM | POA: Diagnosis not present

## 2022-04-20 DIAGNOSIS — K219 Gastro-esophageal reflux disease without esophagitis: Secondary | ICD-10-CM | POA: Diagnosis not present

## 2022-04-20 DIAGNOSIS — E063 Autoimmune thyroiditis: Secondary | ICD-10-CM | POA: Diagnosis not present

## 2022-04-20 DIAGNOSIS — R079 Chest pain, unspecified: Secondary | ICD-10-CM | POA: Diagnosis not present

## 2022-04-20 DIAGNOSIS — E1042 Type 1 diabetes mellitus with diabetic polyneuropathy: Secondary | ICD-10-CM | POA: Diagnosis not present

## 2022-04-20 DIAGNOSIS — Z452 Encounter for adjustment and management of vascular access device: Secondary | ICD-10-CM | POA: Diagnosis not present

## 2022-04-20 DIAGNOSIS — Z7984 Long term (current) use of oral hypoglycemic drugs: Secondary | ICD-10-CM | POA: Diagnosis not present

## 2022-04-20 DIAGNOSIS — I10 Essential (primary) hypertension: Secondary | ICD-10-CM | POA: Diagnosis not present

## 2022-04-20 DIAGNOSIS — E039 Hypothyroidism, unspecified: Secondary | ICD-10-CM | POA: Diagnosis not present

## 2022-04-20 DIAGNOSIS — J4540 Moderate persistent asthma, uncomplicated: Secondary | ICD-10-CM | POA: Diagnosis not present

## 2022-04-20 DIAGNOSIS — R109 Unspecified abdominal pain: Secondary | ICD-10-CM | POA: Diagnosis not present

## 2022-04-20 DIAGNOSIS — R1084 Generalized abdominal pain: Secondary | ICD-10-CM | POA: Diagnosis not present

## 2022-04-20 DIAGNOSIS — E1110 Type 2 diabetes mellitus with ketoacidosis without coma: Secondary | ICD-10-CM | POA: Diagnosis not present

## 2022-04-20 DIAGNOSIS — E1010 Type 1 diabetes mellitus with ketoacidosis without coma: Secondary | ICD-10-CM | POA: Diagnosis not present

## 2022-04-21 DIAGNOSIS — E1010 Type 1 diabetes mellitus with ketoacidosis without coma: Secondary | ICD-10-CM | POA: Diagnosis not present

## 2022-04-21 DIAGNOSIS — K219 Gastro-esophageal reflux disease without esophagitis: Secondary | ICD-10-CM | POA: Diagnosis not present

## 2022-04-21 DIAGNOSIS — I10 Essential (primary) hypertension: Secondary | ICD-10-CM | POA: Diagnosis not present

## 2022-04-22 DIAGNOSIS — K219 Gastro-esophageal reflux disease without esophagitis: Secondary | ICD-10-CM | POA: Diagnosis not present

## 2022-04-22 DIAGNOSIS — E1010 Type 1 diabetes mellitus with ketoacidosis without coma: Secondary | ICD-10-CM | POA: Diagnosis not present

## 2022-04-22 DIAGNOSIS — I10 Essential (primary) hypertension: Secondary | ICD-10-CM | POA: Diagnosis not present

## 2022-04-28 DIAGNOSIS — M542 Cervicalgia: Secondary | ICD-10-CM | POA: Diagnosis not present

## 2022-04-28 DIAGNOSIS — E039 Hypothyroidism, unspecified: Secondary | ICD-10-CM | POA: Diagnosis not present

## 2022-04-28 DIAGNOSIS — J301 Allergic rhinitis due to pollen: Secondary | ICD-10-CM | POA: Diagnosis not present

## 2022-04-28 DIAGNOSIS — G43919 Migraine, unspecified, intractable, without status migrainosus: Secondary | ICD-10-CM | POA: Diagnosis not present

## 2022-04-28 DIAGNOSIS — J4520 Mild intermittent asthma, uncomplicated: Secondary | ICD-10-CM | POA: Diagnosis not present

## 2022-04-28 DIAGNOSIS — K2100 Gastro-esophageal reflux disease with esophagitis, without bleeding: Secondary | ICD-10-CM | POA: Diagnosis not present

## 2022-04-28 DIAGNOSIS — E7849 Other hyperlipidemia: Secondary | ICD-10-CM | POA: Diagnosis not present

## 2022-04-28 DIAGNOSIS — I10 Essential (primary) hypertension: Secondary | ICD-10-CM | POA: Diagnosis not present

## 2022-04-28 DIAGNOSIS — E1065 Type 1 diabetes mellitus with hyperglycemia: Secondary | ICD-10-CM | POA: Diagnosis not present

## 2022-05-14 DIAGNOSIS — E039 Hypothyroidism, unspecified: Secondary | ICD-10-CM | POA: Diagnosis not present

## 2022-05-14 DIAGNOSIS — E1165 Type 2 diabetes mellitus with hyperglycemia: Secondary | ICD-10-CM | POA: Diagnosis not present

## 2022-05-14 DIAGNOSIS — E782 Mixed hyperlipidemia: Secondary | ICD-10-CM | POA: Diagnosis not present

## 2022-05-14 DIAGNOSIS — E7849 Other hyperlipidemia: Secondary | ICD-10-CM | POA: Diagnosis not present

## 2022-05-14 DIAGNOSIS — R748 Abnormal levels of other serum enzymes: Secondary | ICD-10-CM | POA: Diagnosis not present

## 2022-05-14 DIAGNOSIS — K2100 Gastro-esophageal reflux disease with esophagitis, without bleeding: Secondary | ICD-10-CM | POA: Diagnosis not present

## 2022-05-19 DIAGNOSIS — M542 Cervicalgia: Secondary | ICD-10-CM | POA: Diagnosis not present

## 2022-05-19 DIAGNOSIS — I10 Essential (primary) hypertension: Secondary | ICD-10-CM | POA: Diagnosis not present

## 2022-05-19 DIAGNOSIS — G43919 Migraine, unspecified, intractable, without status migrainosus: Secondary | ICD-10-CM | POA: Diagnosis not present

## 2022-05-19 DIAGNOSIS — E7849 Other hyperlipidemia: Secondary | ICD-10-CM | POA: Diagnosis not present

## 2022-05-19 DIAGNOSIS — J301 Allergic rhinitis due to pollen: Secondary | ICD-10-CM | POA: Diagnosis not present

## 2022-05-19 DIAGNOSIS — Z23 Encounter for immunization: Secondary | ICD-10-CM | POA: Diagnosis not present

## 2022-05-19 DIAGNOSIS — E039 Hypothyroidism, unspecified: Secondary | ICD-10-CM | POA: Diagnosis not present

## 2022-05-19 DIAGNOSIS — J4520 Mild intermittent asthma, uncomplicated: Secondary | ICD-10-CM | POA: Diagnosis not present

## 2022-05-19 DIAGNOSIS — E1065 Type 1 diabetes mellitus with hyperglycemia: Secondary | ICD-10-CM | POA: Diagnosis not present

## 2022-05-19 DIAGNOSIS — K2100 Gastro-esophageal reflux disease with esophagitis, without bleeding: Secondary | ICD-10-CM | POA: Diagnosis not present

## 2022-05-19 DIAGNOSIS — R4582 Worries: Secondary | ICD-10-CM | POA: Diagnosis not present

## 2022-07-21 DIAGNOSIS — E1065 Type 1 diabetes mellitus with hyperglycemia: Secondary | ICD-10-CM | POA: Diagnosis not present

## 2022-07-22 DIAGNOSIS — Z1231 Encounter for screening mammogram for malignant neoplasm of breast: Secondary | ICD-10-CM | POA: Diagnosis not present

## 2022-07-24 DIAGNOSIS — E1042 Type 1 diabetes mellitus with diabetic polyneuropathy: Secondary | ICD-10-CM | POA: Diagnosis not present

## 2022-07-24 DIAGNOSIS — E063 Autoimmune thyroiditis: Secondary | ICD-10-CM | POA: Diagnosis not present

## 2022-07-24 DIAGNOSIS — K219 Gastro-esophageal reflux disease without esophagitis: Secondary | ICD-10-CM | POA: Diagnosis not present

## 2022-07-24 DIAGNOSIS — G9341 Metabolic encephalopathy: Secondary | ICD-10-CM | POA: Diagnosis not present

## 2022-07-24 DIAGNOSIS — E876 Hypokalemia: Secondary | ICD-10-CM | POA: Diagnosis not present

## 2022-07-24 DIAGNOSIS — Z79899 Other long term (current) drug therapy: Secondary | ICD-10-CM | POA: Diagnosis not present

## 2022-07-24 DIAGNOSIS — Z794 Long term (current) use of insulin: Secondary | ICD-10-CM | POA: Diagnosis not present

## 2022-07-24 DIAGNOSIS — R112 Nausea with vomiting, unspecified: Secondary | ICD-10-CM | POA: Diagnosis not present

## 2022-07-24 DIAGNOSIS — I10 Essential (primary) hypertension: Secondary | ICD-10-CM | POA: Diagnosis not present

## 2022-07-24 DIAGNOSIS — M1990 Unspecified osteoarthritis, unspecified site: Secondary | ICD-10-CM | POA: Diagnosis not present

## 2022-07-24 DIAGNOSIS — E1110 Type 2 diabetes mellitus with ketoacidosis without coma: Secondary | ICD-10-CM | POA: Diagnosis not present

## 2022-07-24 DIAGNOSIS — E1010 Type 1 diabetes mellitus with ketoacidosis without coma: Secondary | ICD-10-CM | POA: Diagnosis not present

## 2022-07-24 DIAGNOSIS — E782 Mixed hyperlipidemia: Secondary | ICD-10-CM | POA: Diagnosis not present

## 2022-07-24 DIAGNOSIS — Z9641 Presence of insulin pump (external) (internal): Secondary | ICD-10-CM | POA: Diagnosis not present

## 2022-07-24 DIAGNOSIS — J4540 Moderate persistent asthma, uncomplicated: Secondary | ICD-10-CM | POA: Diagnosis not present

## 2022-07-25 DIAGNOSIS — R112 Nausea with vomiting, unspecified: Secondary | ICD-10-CM | POA: Diagnosis not present

## 2022-07-27 DIAGNOSIS — E876 Hypokalemia: Secondary | ICD-10-CM | POA: Diagnosis not present

## 2022-07-27 DIAGNOSIS — E1043 Type 1 diabetes mellitus with diabetic autonomic (poly)neuropathy: Secondary | ICD-10-CM | POA: Diagnosis not present

## 2022-07-27 DIAGNOSIS — R000 Tachycardia, unspecified: Secondary | ICD-10-CM | POA: Diagnosis not present

## 2022-07-27 DIAGNOSIS — K3184 Gastroparesis: Secondary | ICD-10-CM | POA: Diagnosis not present

## 2022-07-27 DIAGNOSIS — R059 Cough, unspecified: Secondary | ICD-10-CM | POA: Diagnosis not present

## 2022-07-27 DIAGNOSIS — M5030 Other cervical disc degeneration, unspecified cervical region: Secondary | ICD-10-CM | POA: Diagnosis not present

## 2022-07-27 DIAGNOSIS — G43909 Migraine, unspecified, not intractable, without status migrainosus: Secondary | ICD-10-CM | POA: Diagnosis not present

## 2022-07-27 DIAGNOSIS — R739 Hyperglycemia, unspecified: Secondary | ICD-10-CM | POA: Diagnosis not present

## 2022-07-27 DIAGNOSIS — Z794 Long term (current) use of insulin: Secondary | ICD-10-CM | POA: Diagnosis not present

## 2022-07-27 DIAGNOSIS — E039 Hypothyroidism, unspecified: Secondary | ICD-10-CM | POA: Diagnosis not present

## 2022-07-27 DIAGNOSIS — Z825 Family history of asthma and other chronic lower respiratory diseases: Secondary | ICD-10-CM | POA: Diagnosis not present

## 2022-07-27 DIAGNOSIS — N179 Acute kidney failure, unspecified: Secondary | ICD-10-CM | POA: Diagnosis not present

## 2022-07-27 DIAGNOSIS — E782 Mixed hyperlipidemia: Secondary | ICD-10-CM | POA: Diagnosis not present

## 2022-07-27 DIAGNOSIS — Z20822 Contact with and (suspected) exposure to covid-19: Secondary | ICD-10-CM | POA: Diagnosis not present

## 2022-07-27 DIAGNOSIS — E1010 Type 1 diabetes mellitus with ketoacidosis without coma: Secondary | ICD-10-CM | POA: Diagnosis not present

## 2022-07-27 DIAGNOSIS — R0789 Other chest pain: Secondary | ICD-10-CM | POA: Diagnosis not present

## 2022-07-27 DIAGNOSIS — I10 Essential (primary) hypertension: Secondary | ICD-10-CM | POA: Diagnosis not present

## 2022-07-27 DIAGNOSIS — E1042 Type 1 diabetes mellitus with diabetic polyneuropathy: Secondary | ICD-10-CM | POA: Diagnosis not present

## 2022-07-27 DIAGNOSIS — R079 Chest pain, unspecified: Secondary | ICD-10-CM | POA: Diagnosis not present

## 2022-07-27 DIAGNOSIS — R1111 Vomiting without nausea: Secondary | ICD-10-CM | POA: Diagnosis not present

## 2022-07-27 DIAGNOSIS — K219 Gastro-esophageal reflux disease without esophagitis: Secondary | ICD-10-CM | POA: Diagnosis not present

## 2022-07-27 DIAGNOSIS — R072 Precordial pain: Secondary | ICD-10-CM | POA: Diagnosis not present

## 2022-08-06 DIAGNOSIS — E039 Hypothyroidism, unspecified: Secondary | ICD-10-CM | POA: Diagnosis not present

## 2022-08-06 DIAGNOSIS — K2100 Gastro-esophageal reflux disease with esophagitis, without bleeding: Secondary | ICD-10-CM | POA: Diagnosis not present

## 2022-08-06 DIAGNOSIS — E108 Type 1 diabetes mellitus with unspecified complications: Secondary | ICD-10-CM | POA: Diagnosis not present

## 2022-08-11 ENCOUNTER — Encounter (INDEPENDENT_AMBULATORY_CARE_PROVIDER_SITE_OTHER): Payer: Self-pay | Admitting: *Deleted

## 2022-08-25 DIAGNOSIS — M5412 Radiculopathy, cervical region: Secondary | ICD-10-CM | POA: Diagnosis not present

## 2022-08-28 DIAGNOSIS — E108 Type 1 diabetes mellitus with unspecified complications: Secondary | ICD-10-CM | POA: Diagnosis not present

## 2022-08-28 DIAGNOSIS — E039 Hypothyroidism, unspecified: Secondary | ICD-10-CM | POA: Diagnosis not present

## 2022-08-28 DIAGNOSIS — K2100 Gastro-esophageal reflux disease with esophagitis, without bleeding: Secondary | ICD-10-CM | POA: Diagnosis not present

## 2022-09-08 DIAGNOSIS — M5412 Radiculopathy, cervical region: Secondary | ICD-10-CM | POA: Diagnosis not present

## 2022-09-08 DIAGNOSIS — M4802 Spinal stenosis, cervical region: Secondary | ICD-10-CM | POA: Diagnosis not present

## 2022-09-09 DIAGNOSIS — E039 Hypothyroidism, unspecified: Secondary | ICD-10-CM | POA: Diagnosis not present

## 2022-09-09 DIAGNOSIS — E1065 Type 1 diabetes mellitus with hyperglycemia: Secondary | ICD-10-CM | POA: Diagnosis not present

## 2022-09-09 DIAGNOSIS — E7849 Other hyperlipidemia: Secondary | ICD-10-CM | POA: Diagnosis not present

## 2022-09-09 DIAGNOSIS — K2100 Gastro-esophageal reflux disease with esophagitis, without bleeding: Secondary | ICD-10-CM | POA: Diagnosis not present

## 2022-09-13 DIAGNOSIS — E1065 Type 1 diabetes mellitus with hyperglycemia: Secondary | ICD-10-CM | POA: Diagnosis not present

## 2022-09-13 DIAGNOSIS — R4582 Worries: Secondary | ICD-10-CM | POA: Diagnosis not present

## 2022-09-13 DIAGNOSIS — M542 Cervicalgia: Secondary | ICD-10-CM | POA: Diagnosis not present

## 2022-09-13 DIAGNOSIS — K2100 Gastro-esophageal reflux disease with esophagitis, without bleeding: Secondary | ICD-10-CM | POA: Diagnosis not present

## 2022-09-13 DIAGNOSIS — E7849 Other hyperlipidemia: Secondary | ICD-10-CM | POA: Diagnosis not present

## 2022-09-13 DIAGNOSIS — E039 Hypothyroidism, unspecified: Secondary | ICD-10-CM | POA: Diagnosis not present

## 2022-09-13 DIAGNOSIS — M25561 Pain in right knee: Secondary | ICD-10-CM | POA: Diagnosis not present

## 2022-09-13 DIAGNOSIS — I10 Essential (primary) hypertension: Secondary | ICD-10-CM | POA: Diagnosis not present

## 2022-09-13 DIAGNOSIS — J4520 Mild intermittent asthma, uncomplicated: Secondary | ICD-10-CM | POA: Diagnosis not present

## 2022-09-13 DIAGNOSIS — G43919 Migraine, unspecified, intractable, without status migrainosus: Secondary | ICD-10-CM | POA: Diagnosis not present

## 2022-10-07 ENCOUNTER — Ambulatory Visit (INDEPENDENT_AMBULATORY_CARE_PROVIDER_SITE_OTHER): Payer: 59 | Admitting: Gastroenterology

## 2022-10-07 ENCOUNTER — Encounter (INDEPENDENT_AMBULATORY_CARE_PROVIDER_SITE_OTHER): Payer: Self-pay | Admitting: Gastroenterology

## 2022-10-07 VITALS — BP 128/81 | HR 67 | Temp 98.2°F | Ht 68.0 in | Wt 183.0 lb

## 2022-10-07 DIAGNOSIS — R112 Nausea with vomiting, unspecified: Secondary | ICD-10-CM | POA: Diagnosis not present

## 2022-10-07 DIAGNOSIS — K219 Gastro-esophageal reflux disease without esophagitis: Secondary | ICD-10-CM | POA: Insufficient documentation

## 2022-10-07 DIAGNOSIS — Z800 Family history of malignant neoplasm of digestive organs: Secondary | ICD-10-CM | POA: Insufficient documentation

## 2022-10-07 HISTORY — DX: Nausea with vomiting, unspecified: R11.2

## 2022-10-07 MED ORDER — PEG 3350-KCL-NA BICARB-NACL 420 G PO SOLR: 4000.0000 mL | Freq: Once | ORAL | 0 refills | Status: AC

## 2022-10-07 MED ORDER — PANTOPRAZOLE SODIUM 40 MG PO TBEC: 40.0000 mg | DELAYED_RELEASE_TABLET | Freq: Two times a day (BID) | ORAL | 1 refills | Status: AC

## 2022-10-07 NOTE — Patient Instructions (Addendum)
Stop omeprazole  Start pantoprazole 40mg  twice daily for acid reflux We will schedule upper endoscopy for further evaluation of your nausea and vomiting as well as update colonoscopy at that time If upper endoscopy does not show anything, we may proceed with a gastric emptying study to further evaluate for a disorder called gastoparesis.  Follow up 3 months   It was a pleasure to see you today. I want to create trusting relationships with patients and provide genuine, compassionate, and quality care. I truly value your feedback! please be on the lookout for a survey regarding your visit with me today. I appreciate your input about our visit and your time in completing this!    Kathleen Norris L. Jeanmarie Hubert, MSN, APRN, AGNP-C Adult-Gerontology Nurse Practitioner [facility] at [facility]

## 2022-10-07 NOTE — H&P (View-Only) (Signed)
Referring Provider: Richardean Chimera, MD Primary Care Physician:  Richardean Chimera, MD Primary GI Physician: new (Dr. Tasia Catchings)   Chief Complaint  Patient presents with   Abdominal Pain    Referred for abdominal pain and vomiting. Has mid abdominal pain every day. Vomiting about three times per week. Started about 6 months ago.    HPI:   Kathleen Norris is a 57 y.o. female with past medical history of arthritis, asthma, anxiety, chronic pain, DM, GERD, SVT, PAF on eliquis   Patient presenting today as a new patient for abdominal pain and vomiting and to schedule screening colonoscopy   Patient reports for the past 6 months she has had vomiting, noting whole food that is sometimes from a few days before. She endorses upper abdominal pain. Vomiting occurring about 3 days out of the week. She has nausea that comes and goes, sometimes there when she wakes up, does not feel that eating influences her nausea or abdominal pain. She notes she has lost 17+ pounds but was started on Rybelsus about 4 months ago for her blood sugars. She notes they have had difficulty getting her blood sugars under controlled, last A1c was 12, previously 17. Denies any new medications prior to her vomiting beginning. Denies rectal bleeding or melena.   She is on omeprazole 40mg  BID, having breakthrough about 4x per week, having to take tums when this occurs. No dysphagia. She has history of asthma which PPI previously helped but is now coughing more.   NSAID use: uses ibuprofen maybe once per month Social hx: no etoh or tobacco  Fam hx: mother has history of Colon cancer around age 6/61   Last Colonoscopy: 09/2017  Last Endoscopy: never   Recommendations:   Past Medical History:  Diagnosis Date   Arthritis    Asthma    Chronic anxiety    Chronic pain    Diabetes mellitus    Diagnosed 1997   Diabetes with ketoacidosis    GERD (gastroesophageal reflux disease)    Sinusitis    SVT (supraventricular tachycardia)     Ablation per Dr. Ladona Ridgel in November 2005   URI (upper respiratory infection)     Past Surgical History:  Procedure Laterality Date   ANTERIOR CERVICAL DECOMP/DISCECTOMY FUSION N/A 09/03/2016   Procedure: ANTERIOR CERVICAL DECOMPRESSION FUSION  - CERVICAL FOUR-FIVE - CERVICAL FIVE-SIX;  Surgeon: Julio Sicks, MD;  Location: [facility] OR;  Service: Neurosurgery;  Laterality: N/A;   LEFT HEART CATHETERIZATION WITH CORONARY ANGIOGRAM N/A 02/16/2011   Procedure: LEFT HEART CATHETERIZATION WITH CORONARY ANGIOGRAM;  Surgeon: Kathleene Hazel, MD;  Location: [facility] CATH LAB;  Service: Cardiovascular;  Laterality: N/A;   TUBAL LIGATION      Current Outpatient Medications  Medication Sig Dispense Refill   albuterol (PROVENTIL HFA;VENTOLIN HFA) 108 (90 BASE) MCG/ACT inhaler Inhale 2 puffs into the lungs every 6 (six) hours as needed for wheezing. 1 Inhaler 2   ALPRAZolam (XANAX) 0.5 MG tablet TAKE ONE TABLET TWICE DAILY AS NEEDED 60 tablet 0   atorvastatin (LIPITOR) 80 MG tablet Take 80 mg by mouth daily.     Blood Glucose Monitoring Suppl (BAYER CONTOUR MONITOR) W/DEVICE KIT by Does not apply route.     citalopram (CELEXA) 20 MG tablet Take 20 mg by mouth daily.     Continuous Blood Gluc Transmit (DEXCOM G6 TRANSMITTER) MISC 1 Device by Does not apply route every 3 (three) months. 1 each 3   ELIQUIS 5 MG TABS tablet Take 1  tablet by mouth 2 (two) times daily.     gabapentin (NEURONTIN) 300 MG capsule Take 300 mg by mouth 3 (three) times daily.     glucose blood test strip 1 each by Other route 3 (three) times daily. Use as instructed     ibuprofen (ADVIL,MOTRIN) 200 MG tablet Take 400 mg by mouth every 6 (six) hours as needed for mild pain or moderate pain.     insulin glargine (LANTUS) 100 UNIT/ML injection Inject 20 Units into the skin at bedtime. As of 04/08/20 pt taking 20 units daily     insulin lispro (HUMALOG) 100 UNIT/ML injection Inject 0.02-0.06 mLs (2-6 Units total) into the skin 3 (three) times  daily before meals. Pens, please. 30 mL 3   ipratropium-albuterol (DUONEB) 0.5-2.5 (3) MG/3ML SOLN Take 3 mLs by nebulization every 4 (four) hours as needed.     levothyroxine (SYNTHROID) 150 MCG tablet Take 150 mcg by mouth daily.     lisinopril (ZESTRIL) 10 MG tablet Take 1 tablet by mouth daily.     omeprazole (PRILOSEC) 40 MG capsule Take 1 capsule (40 mg total) by mouth daily. (Patient taking differently: Take 40 mg by mouth 2 (two) times daily.) 30 capsule 4   PRODIGY LANCETS 21G MISC by Does not apply route 3 (three) times daily.     Semaglutide (RYBELSUS) 14 MG TABS Take by mouth daily.     Glucagon 3 MG/DOSE POWD Place 3 mg into the nose once as needed for up to 1 dose. (Patient not taking: Reported on 10/07/2022) 1 each 11   No current facility-administered medications for this visit.    Allergies as of 10/07/2022   (No Known Allergies)    Family History  Problem Relation Age of Onset   Heart disease Maternal Uncle    Diabetes Mother    Cancer Mother     Social History   Socioeconomic History   Marital status: Married    Spouse name: Not on file   Number of children: 4   Years of education: Not on file   Highest education level: Not on file  Occupational History   Occupation: Unemployed-Disability  Tobacco Use   Smoking status: Never    Passive exposure: Current   Smokeless tobacco: Never  Substance and Sexual Activity   Alcohol use: No   Drug use: No   Sexual activity: Not on file  Other Topics Concern   Not on file  Social History Narrative   Not on file   Social Determinants of Health   Financial Resource Strain: Not on file  Food Insecurity: Not on file  Transportation Needs: Not on file  Physical Activity: Not on file  Stress: Not on file  Social Connections: Not on file    Review of systems General: negative for malaise, night sweats, fever, chills, weight loss  Neck: Negative for lumps, goiter, pain and significant neck swelling Resp:  Negative for cough, wheezing, dyspnea at rest CV: Negative for chest pain, leg swelling, palpitations, orthopnea GI: denies melena, hematochezia, diarrhea, constipation, dysphagia, odyonophagia, or unintentional weight loss. +Nausea +vomiting +upper abdominal pain +early satiety  MSK: Negative for joint pain or swelling, back pain, and muscle pain. Derm: Negative for itching or rash Psych: Denies depression, anxiety, memory loss, confusion. No homicidal or suicidal ideation.  Heme: Negative for prolonged bleeding, bruising easily, and swollen nodes. Endocrine: Negative for cold or heat intolerance, polyuria, polydipsia and goiter. Neuro: negative for tremor, gait imbalance, syncope and seizures. The remainder of  the review of systems is noncontributory.  Physical Exam: BP 128/81 (BP Location: Left Arm)   Pulse 67   Temp 98.2 F (36.8 C) (Oral)   Ht 5\' 8"  (1.727 m)   Wt 183 lb (83 kg)   BMI 27.83 kg/m  General:   Alert and oriented. No distress noted. Pleasant and cooperative.  Head:  Normocephalic and atraumatic. Eyes:  Conjuctiva clear without scleral icterus. Mouth:  Oral mucosa pink and moist. Good dentition. No lesions. Heart: Normal rate and rhythm, s1 and s2 heart sounds present.  Lungs: Clear lung sounds in all lobes. Respirations equal and unlabored. Abdomen:  +BS, soft, and non-distended. Mild TTP of upper abdomen. No rebound or guarding. No HSM or masses noted. Derm: No palmar erythema or jaundice Msk:  Symmetrical without gross deformities. Normal posture. Extremities:  Without edema. Neurologic:  Alert and  oriented x4 Psych:  Alert and cooperative. Normal mood and affect.  Invalid input(s): "6 MONTHS"   ASSESSMENT: Kathleen Norris is a 57 y.o. female presenting today as a new patient for nausea, vomiting and to schedule screening colonoscopy  Nausea/Vomiting/upper abdominal pain/uncontrolled GERD: nausea occurring anytime without precipitating factors, taking zofran  which helps, vomiting a few days per week. GERD is not well controlled on omeprazole BID. Notably A1c was 17, most recently 12, most recent BG levels in May appeared to be in the mid 100s, certainly raises concern for gastroparesis. She has upper abdominal pain, would be important to rule out causes such as PUD, gastritis, duodenitis, H Pylori with EGD. If EGD unremarkable, will proceed with GES. Will stop omeprazole and start protonix 40mg  BID.   Family history CRC: Colon cancer in patient's mother around age 58, last Colonoscopy July 2019. She is due for screening. Discussed scheduling with EGD or holding off until her UGI symptoms are sorted out, however, she feels she is up to consuming prep for colonoscopy at this time and would like to schedule colonoscopy with EGD.   Indications, risks and benefits of procedure discussed in detail with patient. Patient verbalized understanding and is in agreement to proceed with EGD/Colonoscopy.   PLAN:  Stop omeprazole  2. Start protonix 40mg  BID  3. Continue zofran PRN 4. Schedule EGD/Colonoscopy-ASA III, on eliquis hold x2 5. GES if EGD unremarkable   All questions were answered, patient verbalized understanding and is in agreement with plan as outlined above.    Follow Up: 3 months   Chelsea L. Jeanmarie Hubert, MSN, APRN, AGNP-C Adult-Gerontology Nurse Practitioner [facility]

## 2022-10-07 NOTE — Progress Notes (Signed)
Referring Provider: Richardean Chimera, MD Primary Care Physician:  Richardean Chimera, MD Primary GI Physician: new (Dr. Tasia Catchings)   Chief Complaint  Patient presents with   Abdominal Pain    Referred for abdominal pain and vomiting. Has mid abdominal pain every day. Vomiting about three times per week. Started about 6 months ago.    HPI:   Kathleen Norris is a 57 y.o. female with past medical history of arthritis, asthma, anxiety, chronic pain, DM, GERD, SVT, PAF on eliquis   Patient presenting today as a new patient for abdominal pain and vomiting and to schedule screening colonoscopy   Patient reports for the past 6 months she has had vomiting, noting whole food that is sometimes from a few days before. She endorses upper abdominal pain. Vomiting occurring about 3 days out of the week. She has nausea that comes and goes, sometimes there when she wakes up, does not feel that eating influences her nausea or abdominal pain. She notes she has lost 17+ pounds but was started on Rybelsus about 4 months ago for her blood sugars. She notes they have had difficulty getting her blood sugars under controlled, last A1c was 12, previously 17. Denies any new medications prior to her vomiting beginning. Denies rectal bleeding or melena.   She is on omeprazole 40mg  BID, having breakthrough about 4x per week, having to take tums when this occurs. No dysphagia. She has history of asthma which PPI previously helped but is now coughing more.   NSAID use: uses ibuprofen maybe once per month Social hx: no etoh or tobacco  Fam hx: mother has history of Colon cancer around age 6/61   Last Colonoscopy: 09/2017  Last Endoscopy: never   Recommendations:   Past Medical History:  Diagnosis Date   Arthritis    Asthma    Chronic anxiety    Chronic pain    Diabetes mellitus    Diagnosed 1997   Diabetes with ketoacidosis    GERD (gastroesophageal reflux disease)    Sinusitis    SVT (supraventricular tachycardia)     Ablation per Dr. Ladona Ridgel in November 2005   URI (upper respiratory infection)     Past Surgical History:  Procedure Laterality Date   ANTERIOR CERVICAL DECOMP/DISCECTOMY FUSION N/A 09/03/2016   Procedure: ANTERIOR CERVICAL DECOMPRESSION FUSION  - CERVICAL FOUR-FIVE - CERVICAL FIVE-SIX;  Surgeon: Julio Sicks, MD;  Location: [facility] OR;  Service: Neurosurgery;  Laterality: N/A;   LEFT HEART CATHETERIZATION WITH CORONARY ANGIOGRAM N/A 02/16/2011   Procedure: LEFT HEART CATHETERIZATION WITH CORONARY ANGIOGRAM;  Surgeon: Kathleene Hazel, MD;  Location: [facility] CATH LAB;  Service: Cardiovascular;  Laterality: N/A;   TUBAL LIGATION      Current Outpatient Medications  Medication Sig Dispense Refill   albuterol (PROVENTIL HFA;VENTOLIN HFA) 108 (90 BASE) MCG/ACT inhaler Inhale 2 puffs into the lungs every 6 (six) hours as needed for wheezing. 1 Inhaler 2   ALPRAZolam (XANAX) 0.5 MG tablet TAKE ONE TABLET TWICE DAILY AS NEEDED 60 tablet 0   atorvastatin (LIPITOR) 80 MG tablet Take 80 mg by mouth daily.     Blood Glucose Monitoring Suppl (BAYER CONTOUR MONITOR) W/DEVICE KIT by Does not apply route.     citalopram (CELEXA) 20 MG tablet Take 20 mg by mouth daily.     Continuous Blood Gluc Transmit (DEXCOM G6 TRANSMITTER) MISC 1 Device by Does not apply route every 3 (three) months. 1 each 3   ELIQUIS 5 MG TABS tablet Take 1  tablet by mouth 2 (two) times daily.     gabapentin (NEURONTIN) 300 MG capsule Take 300 mg by mouth 3 (three) times daily.     glucose blood test strip 1 each by Other route 3 (three) times daily. Use as instructed     ibuprofen (ADVIL,MOTRIN) 200 MG tablet Take 400 mg by mouth every 6 (six) hours as needed for mild pain or moderate pain.     insulin glargine (LANTUS) 100 UNIT/ML injection Inject 20 Units into the skin at bedtime. As of 04/08/20 pt taking 20 units daily     insulin lispro (HUMALOG) 100 UNIT/ML injection Inject 0.02-0.06 mLs (2-6 Units total) into the skin 3 (three) times  daily before meals. Pens, please. 30 mL 3   ipratropium-albuterol (DUONEB) 0.5-2.5 (3) MG/3ML SOLN Take 3 mLs by nebulization every 4 (four) hours as needed.     levothyroxine (SYNTHROID) 150 MCG tablet Take 150 mcg by mouth daily.     lisinopril (ZESTRIL) 10 MG tablet Take 1 tablet by mouth daily.     omeprazole (PRILOSEC) 40 MG capsule Take 1 capsule (40 mg total) by mouth daily. (Patient taking differently: Take 40 mg by mouth 2 (two) times daily.) 30 capsule 4   PRODIGY LANCETS 21G MISC by Does not apply route 3 (three) times daily.     Semaglutide (RYBELSUS) 14 MG TABS Take by mouth daily.     Glucagon 3 MG/DOSE POWD Place 3 mg into the nose once as needed for up to 1 dose. (Patient not taking: Reported on 10/07/2022) 1 each 11   No current facility-administered medications for this visit.    Allergies as of 10/07/2022   (No Known Allergies)    Family History  Problem Relation Age of Onset   Heart disease Maternal Uncle    Diabetes Mother    Cancer Mother     Social History   Socioeconomic History   Marital status: Married    Spouse name: Not on file   Number of children: 4   Years of education: Not on file   Highest education level: Not on file  Occupational History   Occupation: Unemployed-Disability  Tobacco Use   Smoking status: Never    Passive exposure: Current   Smokeless tobacco: Never  Substance and Sexual Activity   Alcohol use: No   Drug use: No   Sexual activity: Not on file  Other Topics Concern   Not on file  Social History Narrative   Not on file   Social Determinants of Health   Financial Resource Strain: Not on file  Food Insecurity: Not on file  Transportation Needs: Not on file  Physical Activity: Not on file  Stress: Not on file  Social Connections: Not on file    Review of systems General: negative for malaise, night sweats, fever, chills, weight loss  Neck: Negative for lumps, goiter, pain and significant neck swelling Resp:  Negative for cough, wheezing, dyspnea at rest CV: Negative for chest pain, leg swelling, palpitations, orthopnea GI: denies melena, hematochezia, diarrhea, constipation, dysphagia, odyonophagia, or unintentional weight loss. +Nausea +vomiting +upper abdominal pain +early satiety  MSK: Negative for joint pain or swelling, back pain, and muscle pain. Derm: Negative for itching or rash Psych: Denies depression, anxiety, memory loss, confusion. No homicidal or suicidal ideation.  Heme: Negative for prolonged bleeding, bruising easily, and swollen nodes. Endocrine: Negative for cold or heat intolerance, polyuria, polydipsia and goiter. Neuro: negative for tremor, gait imbalance, syncope and seizures. The remainder of  the review of systems is noncontributory.  Physical Exam: BP 128/81 (BP Location: Left Arm)   Pulse 67   Temp 98.2 F (36.8 C) (Oral)   Ht 5\' 8"  (1.727 m)   Wt 183 lb (83 kg)   BMI 27.83 kg/m  General:   Alert and oriented. No distress noted. Pleasant and cooperative.  Head:  Normocephalic and atraumatic. Eyes:  Conjuctiva clear without scleral icterus. Mouth:  Oral mucosa pink and moist. Good dentition. No lesions. Heart: Normal rate and rhythm, s1 and s2 heart sounds present.  Lungs: Clear lung sounds in all lobes. Respirations equal and unlabored. Abdomen:  +BS, soft, and non-distended. Mild TTP of upper abdomen. No rebound or guarding. No HSM or masses noted. Derm: No palmar erythema or jaundice Msk:  Symmetrical without gross deformities. Normal posture. Extremities:  Without edema. Neurologic:  Alert and  oriented x4 Psych:  Alert and cooperative. Normal mood and affect.  Invalid input(s): "6 MONTHS"   ASSESSMENT: LATORA QUARRY is a 57 y.o. female presenting today as a new patient for nausea, vomiting and to schedule screening colonoscopy  Nausea/Vomiting/upper abdominal pain/uncontrolled GERD: nausea occurring anytime without precipitating factors, taking zofran  which helps, vomiting a few days per week. GERD is not well controlled on omeprazole BID. Notably A1c was 17, most recently 12, most recent BG levels in May appeared to be in the mid 100s, certainly raises concern for gastroparesis. She has upper abdominal pain, would be important to rule out causes such as PUD, gastritis, duodenitis, H Pylori with EGD. If EGD unremarkable, will proceed with GES. Will stop omeprazole and start protonix 40mg  BID.   Family history CRC: Colon cancer in patient's mother around age 58, last Colonoscopy July 2019. She is due for screening. Discussed scheduling with EGD or holding off until her UGI symptoms are sorted out, however, she feels she is up to consuming prep for colonoscopy at this time and would like to schedule colonoscopy with EGD.   Indications, risks and benefits of procedure discussed in detail with patient. Patient verbalized understanding and is in agreement to proceed with EGD/Colonoscopy.   PLAN:  Stop omeprazole  2. Start protonix 40mg  BID  3. Continue zofran PRN 4. Schedule EGD/Colonoscopy-ASA III, on eliquis hold x2 5. GES if EGD unremarkable   All questions were answered, patient verbalized understanding and is in agreement with plan as outlined above.    Follow Up: 3 months   Pressley Barsky L. Jeanmarie Hubert, MSN, APRN, AGNP-C Adult-Gerontology Nurse Practitioner [facility]

## 2022-10-11 ENCOUNTER — Telehealth (INDEPENDENT_AMBULATORY_CARE_PROVIDER_SITE_OTHER): Payer: Self-pay | Admitting: Gastroenterology

## 2022-10-11 NOTE — Telephone Encounter (Signed)
Pt called into office in regards to pre op. Gave pt pre op info

## 2022-10-11 NOTE — Telephone Encounter (Signed)
Attempted to contact patient to inform her of pre op appt for 10/12/22 at 7:30am. Call unable to be completed at this time

## 2022-10-11 NOTE — Patient Instructions (Signed)
Kathleen Norris  10/11/2022     @PREFPERIOPPHARMACY @   Your procedure is scheduled on  10/14/2022.   [facility] to [name] at  1110 A.M.   Call this number if you have problems the morning of surgery:  [phone]  If you experience any cold or flu symptoms such as cough, fever, chills, shortness of breath, etc. between now and your scheduled surgery, please notify us at the above number.   Remember:  Follow the diet and prep instructions given to you by the office.      Use your nebulizer and your inhaler before you come and bring your rescue inhaler with you.      Take 20 units of your night time insulin the night before your procedure.     DO NOT take any medications for diabetes the morning of your procedure.       Take these medicines the morning of surgery with A SIP OF WATER        xanax(if needed), celexa, gabapentin, levothyroxine, pantoprazole.     Do not wear jewelry, make-up or nail polish, including gel polish,  artificial nails, or any other type of covering on natural nails (fingers and  toes).  Do not wear lotions, powders, or perfumes, or deodorant.  Do not shave 48 hours prior to surgery.  Men may shave face and neck.  Do not bring valuables to the hospital.  [facility] is not responsible for any belongings or valuables.  Contacts, dentures or bridgework may not be worn into surgery.  Leave your suitcase in the car.  After surgery it may be brought to your room.  For patients admitted to the hospital, discharge time will be determined by your treatment team.  Patients discharged the day of surgery will not be allowed to drive home and must have someone with them for 24 hours.    Special instructions:   DO NOT smoke tobacco or vape for 24 hours before your procedure.  Please read over the following fact sheets that you were given. Anesthesia Post-op Instructions and Care and Recovery After Surgery      Upper Endoscopy, Adult, Care  After After the procedure, it is common to have a sore throat. It is also common to have: Mild stomach pain or discomfort. Bloating. Nausea. Follow these instructions at home: The instructions below may help you care for yourself at home. Your health care provider may give you more instructions. If you have questions, ask your health care provider. If you were given a sedative during the procedure, it can affect you for several hours. Do not drive or operate machinery until your health care provider says that it is safe. If you will be going home right after the procedure, plan to have a responsible adult: Take you home from the hospital or clinic. You will not be allowed to drive. Care for you for the time you are told. Follow instructions from your health care provider about what you may eat and drink. Return to your normal activities as told by your health care provider. Ask your health care provider what activities are safe for you. Take over-the-counter and prescription medicines only as told by your health care provider. Contact a health care provider if you: Have a sore throat that lasts longer than one day. Have trouble swallowing. Have a fever. Get help right away if you: Vomit blood or your vomit looks like coffee grounds. Have bloody, black, or  tarry stools. Have a very bad sore throat or you cannot swallow. Have difficulty breathing or very bad pain in your chest or abdomen. These symptoms may be an emergency. Get help right away. Call 911. Do not wait to see if the symptoms will go away. Do not drive yourself to the hospital. Summary After the procedure, it is common to have a sore throat, mild stomach discomfort, bloating, and nausea. If you were given a sedative during the procedure, it can affect you for several hours. Do not drive until your health care provider says that it is safe. Follow instructions from your health care provider about what you may eat and  drink. Return to your normal activities as told by your health care provider. This information is not intended to replace advice given to you by your health care provider. Make sure you discuss any questions you have with your health care provider. Document Revised: 06/24/2021 Document Reviewed: 06/24/2021 Elsevier Patient Education  2024 Elsevier Inc. Colonoscopy, Adult, Care After The following information offers guidance on how to care for yourself after your procedure. Your health care provider may also give you more specific instructions. If you have problems or questions, contact your health care provider. What can I expect after the procedure? After the procedure, it is common to have: A small amount of blood in your stool for 24 hours after the procedure. Some gas. Mild cramping or bloating of your abdomen. Follow these instructions at home: Eating and drinking  Drink enough fluid to keep your urine pale yellow. Follow instructions from your health care provider about eating or drinking restrictions. Resume your normal diet as told by your health care provider. Avoid heavy or fried foods that are hard to digest. Activity Rest as told by your health care provider. Avoid sitting for a long time without moving. Get up to take short walks every 1-2 hours. This is important to improve blood flow and breathing. Ask for help if you feel weak or unsteady. Return to your normal activities as told by your health care provider. Ask your health care provider what activities are safe for you. Managing cramping and bloating  Try walking around when you have cramps or feel bloated. If directed, apply heat to your abdomen as told by your health care provider. Use the heat source that your health care provider recommends, such as a moist heat pack or a heating pad. Place a towel between your skin and the heat source. Leave the heat on for 20-30 minutes. Remove the heat if your skin turns bright  red. This is especially important if you are unable to feel pain, heat, or cold. You have a greater risk of getting burned. General instructions If you were given a sedative during the procedure, it can affect you for several hours. Do not drive or operate machinery until your health care provider says that it is safe. For the first 24 hours after the procedure: Do not sign important documents. Do not drink alcohol. Do your regular daily activities at a slower pace than normal. Eat soft foods that are easy to digest. Take over-the-counter and prescription medicines only as told by your health care provider. Keep all follow-up visits. This is important. Contact a health care provider if: You have blood in your stool 2-3 days after the procedure. Get help right away if: You have more than a small spotting of blood in your stool. You have large blood clots in your stool. You have swelling of  your abdomen. You have nausea or vomiting. You have a fever. You have increasing pain in your abdomen that is not relieved with medicine. These symptoms may be an emergency. Get help right away. Call 911. Do not wait to see if the symptoms will go away. Do not drive yourself to the hospital. Summary After the procedure, it is common to have a small amount of blood in your stool. You may also have mild cramping and bloating of your abdomen. If you were given a sedative during the procedure, it can affect you for several hours. Do not drive or operate machinery until your health care provider says that it is safe. Get help right away if you have a lot of blood in your stool, nausea or vomiting, a fever, or increased pain in your abdomen. This information is not intended to replace advice given to you by your health care provider. Make sure you discuss any questions you have with your health care provider. Document Revised: 04/27/2022 Document Reviewed: 11/05/2020 Elsevier Patient Education  2024 Elsevier  Inc. Monitored Anesthesia Care, Care After The following information offers guidance on how to care for yourself after your procedure. Your health care provider may also give you more specific instructions. If you have problems or questions, contact your health care provider. What can I expect after the procedure? After the procedure, it is common to have: Tiredness. Little or no memory about what happened during or after the procedure. Impaired judgment when it comes to making decisions. Nausea or vomiting. Some trouble with balance. Follow these instructions at home: For the time period you were told by your health care provider:  Rest. Do not participate in activities where you could fall or become injured. Do not drive or use machinery. Do not drink alcohol. Do not take sleeping pills or medicines that cause drowsiness. Do not make important decisions or sign legal documents. Do not take care of children on your own. Medicines Take over-the-counter and prescription medicines only as told by your health care provider. If you were prescribed antibiotics, take them as told by your health care provider. Do not stop using the antibiotic even if you start to feel better. Eating and drinking Follow instructions from your health care provider about what you may eat and drink. Drink enough fluid to keep your urine pale yellow. If you vomit: Drink clear fluids slowly and in small amounts as you are able. Clear fluids include water, ice chips, low-calorie sports drinks, and fruit juice that has water added to it (diluted fruit juice). Eat light and bland foods in small amounts as you are able. These foods include bananas, applesauce, rice, lean meats, toast, and crackers. General instructions  Have a responsible adult stay with you for the time you are told. It is important to have someone help care for you until you are awake and alert. If you have sleep apnea, surgery and some medicines  can increase your risk for breathing problems. Follow instructions from your health care provider about wearing your sleep device: When you are sleeping. This includes during daytime naps. While taking prescription pain medicines, sleeping medicines, or medicines that make you drowsy. Do not use any products that contain nicotine or tobacco. These products include cigarettes, chewing tobacco, and vaping devices, such as e-cigarettes. If you need help quitting, ask your health care provider. Contact a health care provider if: You feel nauseous or vomit every time you eat or drink. You feel light-headed. You are still sleepy  or having trouble with balance after 24 hours. You get a rash. You have a fever. You have redness or swelling around the IV site. Get help right away if: You have trouble breathing. You have new confusion after you get home. These symptoms may be an emergency. Get help right away. Call 911. Do not wait to see if the symptoms will go away. Do not drive yourself to the hospital. This information is not intended to replace advice given to you by your health care provider. Make sure you discuss any questions you have with your health care provider. Document Revised: 08/10/2021 Document Reviewed: 08/10/2021 Elsevier Patient Education  2024 [employer].

## 2022-10-12 ENCOUNTER — Encounter (HOSPITAL_COMMUNITY): Payer: Self-pay

## 2022-10-12 ENCOUNTER — Other Ambulatory Visit: Payer: Self-pay

## 2022-10-12 ENCOUNTER — Encounter (HOSPITAL_COMMUNITY)
Admission: RE | Admit: 2022-10-12 | Discharge: 2022-10-12 | Disposition: A | Discharge status: Home / Self Care | Payer: 59 | Source: Ambulatory Visit | Attending: Gastroenterology | Admitting: Gastroenterology

## 2022-10-12 VITALS — BP 127/70 | HR 64 | Temp 98.2°F | Resp 18

## 2022-10-12 DIAGNOSIS — E118 Type 2 diabetes mellitus with unspecified complications: Secondary | ICD-10-CM | POA: Diagnosis not present

## 2022-10-12 DIAGNOSIS — Z01810 Encounter for preprocedural cardiovascular examination: Secondary | ICD-10-CM | POA: Insufficient documentation

## 2022-10-12 HISTORY — DX: Cardiac arrhythmia, unspecified: I49.9

## 2022-10-14 ENCOUNTER — Encounter (HOSPITAL_COMMUNITY)
Admission: RE | Disposition: A | Discharge status: Home / Self Care | Payer: Self-pay | Source: Home / Self Care | Attending: Gastroenterology

## 2022-10-14 ENCOUNTER — Encounter (HOSPITAL_COMMUNITY): Payer: Self-pay

## 2022-10-14 ENCOUNTER — Ambulatory Visit (HOSPITAL_COMMUNITY)
Admission: RE | Admit: 2022-10-14 | Discharge: 2022-10-14 | Disposition: A | Discharge status: Home / Self Care | Payer: 59 | Attending: Gastroenterology | Admitting: Gastroenterology

## 2022-10-14 ENCOUNTER — Ambulatory Visit (HOSPITAL_BASED_OUTPATIENT_CLINIC_OR_DEPARTMENT_OTHER): Payer: 59 | Admitting: Anesthesiology

## 2022-10-14 ENCOUNTER — Ambulatory Visit (HOSPITAL_COMMUNITY): Payer: 59 | Admitting: Anesthesiology

## 2022-10-14 DIAGNOSIS — K219 Gastro-esophageal reflux disease without esophagitis: Secondary | ICD-10-CM | POA: Diagnosis not present

## 2022-10-14 DIAGNOSIS — Z1211 Encounter for screening for malignant neoplasm of colon: Secondary | ICD-10-CM | POA: Insufficient documentation

## 2022-10-14 DIAGNOSIS — K2950 Unspecified chronic gastritis without bleeding: Secondary | ICD-10-CM | POA: Diagnosis not present

## 2022-10-14 DIAGNOSIS — R1310 Dysphagia, unspecified: Secondary | ICD-10-CM | POA: Diagnosis not present

## 2022-10-14 DIAGNOSIS — F419 Anxiety disorder, unspecified: Secondary | ICD-10-CM | POA: Diagnosis not present

## 2022-10-14 DIAGNOSIS — R112 Nausea with vomiting, unspecified: Secondary | ICD-10-CM | POA: Diagnosis not present

## 2022-10-14 DIAGNOSIS — Z800 Family history of malignant neoplasm of digestive organs: Secondary | ICD-10-CM | POA: Diagnosis not present

## 2022-10-14 DIAGNOSIS — K644 Residual hemorrhoidal skin tags: Secondary | ICD-10-CM | POA: Diagnosis not present

## 2022-10-14 DIAGNOSIS — Z794 Long term (current) use of insulin: Secondary | ICD-10-CM | POA: Diagnosis not present

## 2022-10-14 DIAGNOSIS — I480 Paroxysmal atrial fibrillation: Secondary | ICD-10-CM

## 2022-10-14 DIAGNOSIS — K635 Polyp of colon: Secondary | ICD-10-CM | POA: Diagnosis not present

## 2022-10-14 DIAGNOSIS — Z79899 Other long term (current) drug therapy: Secondary | ICD-10-CM | POA: Insufficient documentation

## 2022-10-14 DIAGNOSIS — K3189 Other diseases of stomach and duodenum: Secondary | ICD-10-CM | POA: Insufficient documentation

## 2022-10-14 DIAGNOSIS — E039 Hypothyroidism, unspecified: Secondary | ICD-10-CM | POA: Diagnosis not present

## 2022-10-14 DIAGNOSIS — K648 Other hemorrhoids: Secondary | ICD-10-CM | POA: Insufficient documentation

## 2022-10-14 DIAGNOSIS — D122 Benign neoplasm of ascending colon: Secondary | ICD-10-CM

## 2022-10-14 DIAGNOSIS — J45909 Unspecified asthma, uncomplicated: Secondary | ICD-10-CM | POA: Insufficient documentation

## 2022-10-14 DIAGNOSIS — Z7984 Long term (current) use of oral hypoglycemic drugs: Secondary | ICD-10-CM | POA: Diagnosis not present

## 2022-10-14 DIAGNOSIS — Z7901 Long term (current) use of anticoagulants: Secondary | ICD-10-CM | POA: Diagnosis not present

## 2022-10-14 DIAGNOSIS — E119 Type 2 diabetes mellitus without complications: Secondary | ICD-10-CM | POA: Insufficient documentation

## 2022-10-14 DIAGNOSIS — E118 Type 2 diabetes mellitus with unspecified complications: Secondary | ICD-10-CM

## 2022-10-14 HISTORY — PX: ESOPHAGOGASTRODUODENOSCOPY (EGD) WITH PROPOFOL: SHX5813

## 2022-10-14 HISTORY — PX: BIOPSY: SHX5522

## 2022-10-14 HISTORY — PX: COLONOSCOPY WITH PROPOFOL: SHX5780

## 2022-10-14 LAB — GLUCOSE, CAPILLARY
Glucose-Capillary: 155 mg/dL — ABNORMAL HIGH (ref 70–99)
Glucose-Capillary: 45 mg/dL — ABNORMAL LOW (ref 70–99)

## 2022-10-14 LAB — HM COLONOSCOPY

## 2022-10-14 SURGERY — COLONOSCOPY WITH PROPOFOL
Anesthesia: General

## 2022-10-14 MED ORDER — DEXTROSE 50 % IV SOLN
50.0000 mL | Freq: Once | INTRAVENOUS | Status: AC
  Administered 2022-10-14: 50 mL via INTRAVENOUS

## 2022-10-14 MED ORDER — PROPOFOL 10 MG/ML IV BOLUS
INTRAVENOUS | Status: DC | PRN
  Administered 2022-10-14: 50 mg via INTRAVENOUS
  Administered 2022-10-14: 100 mg via INTRAVENOUS
  Administered 2022-10-14 (×3): 50 mg via INTRAVENOUS

## 2022-10-14 MED ORDER — LIDOCAINE HCL (CARDIAC) PF 100 MG/5ML IV SOSY
PREFILLED_SYRINGE | INTRAVENOUS | Status: DC | PRN
  Administered 2022-10-14: 50 mg via INTRAVENOUS

## 2022-10-14 MED ORDER — DEXTROSE 50 % IV SOLN
INTRAVENOUS | Status: AC
  Filled 2022-10-14: qty 50

## 2022-10-14 MED ORDER — LACTATED RINGERS IV SOLN: INTRAVENOUS | Status: DC

## 2022-10-14 MED ORDER — PROPOFOL 500 MG/50ML IV EMUL
INTRAVENOUS | Status: DC | PRN
  Administered 2022-10-14: 150 ug/kg/min via INTRAVENOUS

## 2022-10-14 NOTE — Anesthesia Preprocedure Evaluation (Signed)
Anesthesia Evaluation  Patient identified by MRN, date of birth, ID band  Reviewed: Allergy & Precautions, Patient's Chart, lab work & pertinent test results  Airway Mallampati: II  TM Distance: >3 FB Neck ROM: Full   Comment: ACDF Dental  (+) Dental Advisory Given, Teeth Intact   Pulmonary asthma    Pulmonary exam normal breath sounds clear to auscultation       Cardiovascular Exercise Tolerance: Good + dysrhythmias (ablation - 2005) Supra Ventricular Tachycardia  Rhythm:Regular Rate:Normal     Neuro/Psych  Headaches PSYCHIATRIC DISORDERS Anxiety      Neuromuscular disease (left upper extremity numbness)    GI/Hepatic Neg liver ROS,GERD  Medicated and Controlled,,  Endo/Other  diabetes (preop blood sugar 45, 50 ml of dextrose 50 was given), Well Controlled, Type 2, Oral Hypoglycemic Agents, Insulin DependentHypothyroidism    Renal/GU negative Renal ROS     Musculoskeletal  (+) Arthritis , Osteoarthritis,    Abdominal   Peds  Hematology negative hematology ROS (+)   Anesthesia Other Findings   Reproductive/Obstetrics                             Anesthesia Physical Anesthesia Plan  ASA: 3  Anesthesia Plan: General   Post-op Pain Management: Minimal or no pain anticipated   Induction: Intravenous  PONV Risk Score and Plan: 1 and Treatment may vary due to age or medical condition and Propofol infusion  Airway Management Planned: Nasal Cannula and Natural Airway  Additional Equipment:   Intra-op Plan:   Post-operative Plan:   Informed Consent: I have reviewed the patients History and Physical, chart, labs and discussed the procedure including the risks, benefits and alternatives for the proposed anesthesia with the patient or authorized representative who has indicated his/her understanding and acceptance.     Dental advisory given  Plan Discussed with: CRNA and  Surgeon  Anesthesia Plan Comments:        Anesthesia Quick Evaluation

## 2022-10-14 NOTE — Op Note (Signed)
[facility] Patient Name: Kathleen Norris Procedure Date: 10/14/2022 1:04 PM MRN: [id number] Date of Birth: 22-May-1965 Attending MD: Sanjuan Dame , MD, [id number] CSN: [id number] Age: 57 Admit Type: Outpatient Procedure:                Upper GI endoscopy Indications:              Dysphagia, Nausea with vomiting Providers:                Sanjuan Dame, MD, Edrick Kins, RN, Dyann Ruddle Referring MD:              Medicines:                Monitored Anesthesia Care Complications:            No immediate complications. Estimated Blood Loss:     Estimated blood loss: none. Procedure:                Pre-Anesthesia Assessment:                           - Prior to the procedure, a History and Physical                            was performed, and patient medications and                            allergies were reviewed. The patient's tolerance of                            previous anesthesia was also reviewed. The risks                            and benefits of the procedure and the sedation                            options and risks were discussed with the patient.                            All questions were answered, and informed consent                            was obtained. Prior Anticoagulants: The patient has                            taken Eliquis (apixaban), last dose was 2 days                            prior to procedure. ASA Grade Assessment: III - A                            patient with severe systemic disease. After                            reviewing the risks and benefits, the patient was  deemed in satisfactory condition to undergo the                            procedure.                           - Prior to the procedure, a History and Physical                            was performed, and patient medications and                            allergies were reviewed. The patient's tolerance of                            previous  anesthesia was also reviewed. The risks                            and benefits of the procedure and the sedation                            options and risks were discussed with the patient.                            All questions were answered, and informed consent                            was obtained. Prior Anticoagulants: The patient has                            taken Eliquis (apixaban), last dose was 2 days                            prior to procedure. ASA Grade Assessment: III - A                            patient with severe systemic disease. After                            reviewing the risks and benefits, the patient was                            deemed in satisfactory condition to undergo the                            procedure.                           After obtaining informed consent, the endoscope was                            passed under direct vision. Throughout the  procedure, the patient's blood pressure, pulse, and                            oxygen saturations were monitored continuously. The                            [id number]-H190 ([id number]) scope was introduced through the                            mouth, and advanced to the second part of duodenum.                            The upper GI endoscopy was accomplished without                            difficulty. The patient tolerated the procedure                            well. Scope In: 1:18:59 PM Scope Out: 1:27:40 PM Total Procedure Duration: 0 hours 8 minutes 41 seconds  Findings:      No endoscopic abnormality was evident in the esophagus to explain the       patient's complaint of dysphagia. Biopsies were obtained from the       proximal and distal esophagus with cold forceps for histology of       suspected eosinophilic esophagitis.      Diffuse atrophic mucosa was found in the stomach. Biopsies were taken       with a cold forceps for histology.      The duodenal bulb and  second portion of the duodenum were normal. Impression:               - No endoscopic esophageal abnormality to explain                            patient's dysphagia.                           - Gastric mucosal atrophy. Biopsied.                           - Normal duodenal bulb and second portion of the                            duodenum.                           - Biopsies were taken with a cold forceps for                            evaluation of eosinophilic esophagitis. Moderate Sedation:      Per Anesthesia Care Recommendation:           - Patient has a contact number available for                            emergencies. The signs and symptoms of potential  delayed complications were discussed with the                            patient. Return to normal activities tomorrow.                            Written discharge instructions were provided to the                            patient.                           - Resume previous diet.                           - Continue present medications.                           - Await pathology results.                           - Repeat upper endoscopy for surveillance based on                            pathology results.                           - Return to GI clinic. Procedure Code(s):        --- Professional ---                           [phone], Esophagogastroduodenoscopy, flexible,                            transoral; with biopsy, single or multiple Diagnosis Code(s):        --- Professional ---                           R13.10, Dysphagia, unspecified                           K31.89, Other diseases of stomach and duodenum                           R11.2, Nausea with vomiting, unspecified CPT copyright 2022 American Medical Association. All rights reserved. The codes documented in this report are preliminary and upon coder review may  be revised to meet current compliance requirements. Sanjuan Dame,  MD Sanjuan Dame, MD 10/14/2022 1:52:26 PM This report has been signed electronically. Number of Addenda: 0

## 2022-10-14 NOTE — Anesthesia Postprocedure Evaluation (Signed)
Anesthesia Post Note  Patient: Kathleen Norris  Procedure(s) Performed: COLONOSCOPY WITH PROPOFOL ESOPHAGOGASTRODUODENOSCOPY (EGD) WITH PROPOFOL BIOPSY  Patient location during evaluation: Phase II Anesthesia Type: General Level of consciousness: awake and alert and oriented Pain management: pain level controlled Vital Signs Assessment: post-procedure vital signs reviewed and stable Respiratory status: spontaneous breathing, nonlabored ventilation and respiratory function stable Cardiovascular status: blood pressure returned to baseline and stable Postop Assessment: no apparent nausea or vomiting Anesthetic complications: no  No notable events documented.   Last Vitals:  Vitals:   10/14/22 1152 10/14/22 1348  BP: (!) 152/71 113/80  Pulse: 67 82  Resp: (!) 9 (!) 9  Temp:  36.4 C  SpO2: 100% 99%    Last Pain:  Vitals:   10/14/22 1348  TempSrc: Oral  PainSc: 0-No pain                 Melitza Metheny C Cinderella Christoffersen

## 2022-10-14 NOTE — Discharge Instructions (Signed)
EGD/Colonoscopy Discharge instructions Please read the instructions outlined below and refer to this sheet in the next few weeks. These discharge instructions provide you with general information on caring for yourself after you leave the hospital. Your doctor may also give you specific instructions. While your treatment has been planned according to the most current medical practices available, unavoidable complications occasionally occur. If you have any problems or questions after discharge, please call your doctor. ACTIVITY You may resume your regular activity but move at a slower pace for the next 24 hours.  Take frequent rest periods for the next 24 hours.  Walking will help expel (get rid of) the air and reduce the bloated feeling in your abdomen.  No driving for 24 hours (because of the anesthesia (medicine) used during the test).  You may shower.  Do not sign any important legal documents or operate any machinery for 24 hours (because of the anesthesia used during the test).  NUTRITION Drink plenty of fluids.  You may resume your normal diet.  Begin with a light meal and progress to your normal diet.  Avoid alcoholic beverages for 24 hours or as instructed by your caregiver.  MEDICATIONS You may resume your normal medications unless your caregiver tells you otherwise.  WHAT YOU CAN EXPECT TODAY You may experience abdominal discomfort such as a feeling of fullness or "gas" pains.  FOLLOW-UP Your doctor will discuss the results of your test with you.  SEEK IMMEDIATE MEDICAL ATTENTION IF ANY OF THE FOLLOWING OCCUR: Excessive nausea (feeling sick to your stomach) and/or vomiting.  Severe abdominal pain and distention (swelling).  Trouble swallowing.  Temperature over 101 F (37.8 C).  Rectal bleeding or vomiting of blood.     You need repeat colonoscopy in next 6 months because prep was poor . This time, patient needs to avoid fiber (vegetables, corn, things with seeds) for 7 days,  Miralax BID for 10 day prior to the procedure, be on clear liquids two days before the procedure and take magnesium citrate 2 days before her procedure.        I hope you have a great rest of your week!   Vista Lawman , M.D.. Gastroenterology and Hepatology [facility]

## 2022-10-14 NOTE — Op Note (Signed)
[facility] Patient Name: Kathleen Norris Procedure Date: 10/14/2022 1:03 PM MRN: [id number] Date of Birth: 1965-04-17 Attending MD: Sanjuan Dame , MD, [id number] CSN: [id number] Age: 57 Admit Type: Outpatient Procedure:                Colonoscopy Indications:              Screening for colorectal malignant neoplasm Providers:                Sanjuan Dame, MD, Edrick Kins, RN, Dyann Ruddle Referring MD:              Medicines:                Monitored Anesthesia Care Complications:            No immediate complications. Estimated Blood Loss:     Estimated blood loss: none. Procedure:                Pre-Anesthesia Assessment:                           - Prior to the procedure, a History and Physical                            was performed, and patient medications and                            allergies were reviewed. The patient's tolerance of                            previous anesthesia was also reviewed. The risks                            and benefits of the procedure and the sedation                            options and risks were discussed with the patient.                            All questions were answered, and informed consent                            was obtained. Prior Anticoagulants: The patient has                            taken Eliquis (apixaban), last dose was 2 days                            prior to procedure. ASA Grade Assessment: III - A                            patient with severe systemic disease. After                            reviewing the risks and benefits, the patient was  deemed in satisfactory condition to undergo the                            procedure.                           After obtaining informed consent, the colonoscope                            was passed under direct vision. Throughout the                            procedure, the patient's blood pressure, pulse, and                             oxygen saturations were monitored continuously. The                            [id number] scope was introduced through the                            anus and advanced to the the cecum, identified by                            the ileocecal valve. The patient tolerated the                            procedure well. The quality of the bowel                            preparation was evaluated using the BBPS [facility]                            Bowel Preparation Scale) with scores of: Right                            Colon = 1 (portion of mucosa seen, but other areas                            not well seen due to staining, residual stool                            and/or opaque liquid), Transverse Colon = 2 (minor                            amount of residual staining, small fragments of                            stool and/or opaque liquid, but mucosa seen well)                            and Left Colon = 1 (portion of mucosa seen, but  other areas not well seen due to staining, residual                            stool and/or opaque liquid). The total BBPS score                            equals 4. The quality of the bowel preparation was                            inadequate. The ileocecal valve and the rectum were                            photographed. Scope In: 1:32:38 PM Scope Out: 1:44:51 PM Scope Withdrawal Time: 0 hours 7 minutes 27 seconds  Total Procedure Duration: 0 hours 12 minutes 13 seconds  Findings:      The perianal and digital rectal examinations were normal.      A moderate amount of stool was found in the entire colon, interfering       with visualization.      A 2 mm polyp was found in the ascending colon. The polyp was sessile.       The polyp was removed with a cold biopsy forceps. Resection and       retrieval were complete. Estimated blood loss was minimal.      External internal hemorrhoids were found during  retroflexion. Impression:               - Preparation of the colon was inadequate.                           - Stool in the entire examined colon.                           - One 2 mm polyp in the ascending colon, removed                            with a cold biopsy forceps. Resected and retrieved.                           - External internal hemorrhoids. Moderate Sedation:      Per Anesthesia Care Recommendation:           - Repeat colonoscopy in 6 months because the bowel                            preparation was suboptimal.                           - -You will be scheduled directly for colonoscopy . Procedure Code(s):        --- Professional ---                           [phone], Colonoscopy, flexible; with biopsy, single                            or multiple Diagnosis Code(s):        ---  Professional ---                           Z12.11, Encounter for screening for malignant                            neoplasm of colon                           K64.4, Residual hemorrhoidal skin tags                           K64.8, Other hemorrhoids                           D12.2, Benign neoplasm of ascending colon CPT copyright 2022 American Medical Association. All rights reserved. The codes documented in this report are preliminary and upon coder review may  be revised to meet current compliance requirements. Sanjuan Dame, MD Sanjuan Dame, MD 10/14/2022 1:49:43 PM This report has been signed electronically. Number of Addenda: 0

## 2022-10-14 NOTE — Anesthesia Procedure Notes (Signed)
Date/Time: 10/14/2022 1:23 PM  Performed by: Julian Reil, CRNAPre-anesthesia Checklist: Patient identified, Emergency Drugs available, Suction available and Patient being monitored Patient Re-evaluated:Patient Re-evaluated prior to induction Oxygen Delivery Method: Nasal cannula Induction Type: IV induction Placement Confirmation: positive ETCO2

## 2022-10-14 NOTE — Interval H&P Note (Signed)
History and Physical Interval Note:  10/14/2022 12:13 PM  Kathleen Norris  has presented today for surgery, with the diagnosis of n/v;family hx colon cancer.  The various methods of treatment have been discussed with the patient and family. After consideration of risks, benefits and other options for treatment, the patient has consented to  Procedure(s) with comments: COLONOSCOPY WITH PROPOFOL (N/A) - 12:45pm;asa 3 ESOPHAGOGASTRODUODENOSCOPY (EGD) WITH PROPOFOL (N/A) - 12:45pm;asa 3 as a surgical intervention.  The patient's history has been reviewed, patient examined, no change in status, stable for surgery.  I have reviewed the patient's chart and labs.  Questions were answered to the patient's satisfaction.     Juanetta Beets Annette Liotta

## 2022-10-14 NOTE — Transfer of Care (Signed)
Immediate Anesthesia Transfer of Care Note  Patient: Kathleen Norris  Procedure(s) Performed: COLONOSCOPY WITH PROPOFOL ESOPHAGOGASTRODUODENOSCOPY (EGD) WITH PROPOFOL BIOPSY  Patient Location: Short Stay  Anesthesia Type:General  Level of Consciousness: awake  Airway & Oxygen Therapy: Patient Spontanous Breathing  Post-op Assessment: Report given to RN and Post -op Vital signs reviewed and stable  Post vital signs: Reviewed and stable  Last Vitals:  Vitals Value Taken Time  BP    Temp    Pulse    Resp    SpO2      Last Pain:  Vitals:   10/14/22 1314  TempSrc:   PainSc: 0-No pain         Complications: No notable events documented.

## 2022-10-15 LAB — GLUCOSE, CAPILLARY: Glucose-Capillary: 119 mg/dL — ABNORMAL HIGH (ref 70–99)

## 2022-10-18 ENCOUNTER — Encounter (INDEPENDENT_AMBULATORY_CARE_PROVIDER_SITE_OTHER): Payer: Self-pay | Admitting: *Deleted

## 2022-10-19 ENCOUNTER — Encounter (INDEPENDENT_AMBULATORY_CARE_PROVIDER_SITE_OTHER): Payer: Self-pay | Admitting: *Deleted

## 2022-10-19 LAB — SURGICAL PATHOLOGY

## 2022-10-20 ENCOUNTER — Encounter (HOSPITAL_COMMUNITY): Payer: Self-pay | Admitting: Gastroenterology

## 2022-10-20 DIAGNOSIS — S92312A Displaced fracture of first metatarsal bone, left foot, initial encounter for closed fracture: Secondary | ICD-10-CM | POA: Diagnosis not present

## 2022-10-20 DIAGNOSIS — E782 Mixed hyperlipidemia: Secondary | ICD-10-CM | POA: Diagnosis not present

## 2022-10-20 DIAGNOSIS — Z794 Long term (current) use of insulin: Secondary | ICD-10-CM | POA: Diagnosis not present

## 2022-10-20 DIAGNOSIS — E1065 Type 1 diabetes mellitus with hyperglycemia: Secondary | ICD-10-CM | POA: Diagnosis not present

## 2022-10-20 DIAGNOSIS — W208XXA Other cause of strike by thrown, projected or falling object, initial encounter: Secondary | ICD-10-CM | POA: Diagnosis not present

## 2022-10-20 DIAGNOSIS — E1042 Type 1 diabetes mellitus with diabetic polyneuropathy: Secondary | ICD-10-CM | POA: Diagnosis not present

## 2022-10-20 DIAGNOSIS — F32A Depression, unspecified: Secondary | ICD-10-CM | POA: Diagnosis not present

## 2022-10-20 DIAGNOSIS — Z79899 Other long term (current) drug therapy: Secondary | ICD-10-CM | POA: Diagnosis not present

## 2022-10-20 DIAGNOSIS — I4891 Unspecified atrial fibrillation: Secondary | ICD-10-CM | POA: Diagnosis not present

## 2022-10-26 DIAGNOSIS — M79672 Pain in left foot: Secondary | ICD-10-CM | POA: Diagnosis not present

## 2022-10-26 DIAGNOSIS — S92312A Displaced fracture of first metatarsal bone, left foot, initial encounter for closed fracture: Secondary | ICD-10-CM | POA: Diagnosis not present

## 2022-10-26 DIAGNOSIS — E1165 Type 2 diabetes mellitus with hyperglycemia: Secondary | ICD-10-CM | POA: Diagnosis not present

## 2022-11-01 DIAGNOSIS — I5189 Other ill-defined heart diseases: Secondary | ICD-10-CM | POA: Diagnosis not present

## 2022-11-01 DIAGNOSIS — I071 Rheumatic tricuspid insufficiency: Secondary | ICD-10-CM | POA: Diagnosis not present

## 2022-11-01 DIAGNOSIS — E109 Type 1 diabetes mellitus without complications: Secondary | ICD-10-CM | POA: Diagnosis not present

## 2022-11-02 DIAGNOSIS — Z79899 Other long term (current) drug therapy: Secondary | ICD-10-CM | POA: Diagnosis not present

## 2022-11-02 DIAGNOSIS — Z794 Long term (current) use of insulin: Secondary | ICD-10-CM | POA: Diagnosis not present

## 2022-11-02 DIAGNOSIS — E782 Mixed hyperlipidemia: Secondary | ICD-10-CM | POA: Diagnosis not present

## 2022-11-02 DIAGNOSIS — S92315A Nondisplaced fracture of first metatarsal bone, left foot, initial encounter for closed fracture: Secondary | ICD-10-CM | POA: Diagnosis not present

## 2022-11-02 DIAGNOSIS — K219 Gastro-esophageal reflux disease without esophagitis: Secondary | ICD-10-CM | POA: Diagnosis not present

## 2022-11-02 DIAGNOSIS — E1042 Type 1 diabetes mellitus with diabetic polyneuropathy: Secondary | ICD-10-CM | POA: Diagnosis not present

## 2022-11-02 DIAGNOSIS — S92312A Displaced fracture of first metatarsal bone, left foot, initial encounter for closed fracture: Secondary | ICD-10-CM | POA: Diagnosis not present

## 2022-11-02 DIAGNOSIS — E039 Hypothyroidism, unspecified: Secondary | ICD-10-CM | POA: Diagnosis not present

## 2022-11-26 DIAGNOSIS — Z794 Long term (current) use of insulin: Secondary | ICD-10-CM | POA: Diagnosis not present

## 2022-11-26 DIAGNOSIS — E1165 Type 2 diabetes mellitus with hyperglycemia: Secondary | ICD-10-CM | POA: Diagnosis not present

## 2022-11-26 DIAGNOSIS — Z79899 Other long term (current) drug therapy: Secondary | ICD-10-CM | POA: Diagnosis not present

## 2022-11-26 DIAGNOSIS — Z4789 Encounter for other orthopedic aftercare: Secondary | ICD-10-CM | POA: Diagnosis not present

## 2022-11-26 DIAGNOSIS — E782 Mixed hyperlipidemia: Secondary | ICD-10-CM | POA: Diagnosis not present

## 2022-11-26 DIAGNOSIS — S92312A Displaced fracture of first metatarsal bone, left foot, initial encounter for closed fracture: Secondary | ICD-10-CM | POA: Diagnosis not present

## 2022-12-17 DIAGNOSIS — E1010 Type 1 diabetes mellitus with ketoacidosis without coma: Secondary | ICD-10-CM | POA: Diagnosis not present

## 2022-12-17 DIAGNOSIS — R748 Abnormal levels of other serum enzymes: Secondary | ICD-10-CM | POA: Diagnosis not present

## 2022-12-17 DIAGNOSIS — E782 Mixed hyperlipidemia: Secondary | ICD-10-CM | POA: Diagnosis not present

## 2022-12-17 DIAGNOSIS — E7849 Other hyperlipidemia: Secondary | ICD-10-CM | POA: Diagnosis not present

## 2022-12-17 DIAGNOSIS — E1065 Type 1 diabetes mellitus with hyperglycemia: Secondary | ICD-10-CM | POA: Diagnosis not present

## 2022-12-17 DIAGNOSIS — E559 Vitamin D deficiency, unspecified: Secondary | ICD-10-CM | POA: Diagnosis not present

## 2022-12-21 DIAGNOSIS — E1065 Type 1 diabetes mellitus with hyperglycemia: Secondary | ICD-10-CM | POA: Diagnosis not present

## 2022-12-24 DIAGNOSIS — E039 Hypothyroidism, unspecified: Secondary | ICD-10-CM | POA: Diagnosis not present

## 2022-12-24 DIAGNOSIS — R4582 Worries: Secondary | ICD-10-CM | POA: Diagnosis not present

## 2022-12-24 DIAGNOSIS — G43919 Migraine, unspecified, intractable, without status migrainosus: Secondary | ICD-10-CM | POA: Diagnosis not present

## 2022-12-24 DIAGNOSIS — S92312A Displaced fracture of first metatarsal bone, left foot, initial encounter for closed fracture: Secondary | ICD-10-CM | POA: Diagnosis not present

## 2022-12-24 DIAGNOSIS — E7849 Other hyperlipidemia: Secondary | ICD-10-CM | POA: Diagnosis not present

## 2022-12-24 DIAGNOSIS — I10 Essential (primary) hypertension: Secondary | ICD-10-CM | POA: Diagnosis not present

## 2022-12-24 DIAGNOSIS — Z23 Encounter for immunization: Secondary | ICD-10-CM | POA: Diagnosis not present

## 2022-12-24 DIAGNOSIS — M542 Cervicalgia: Secondary | ICD-10-CM | POA: Diagnosis not present

## 2022-12-24 DIAGNOSIS — K2100 Gastro-esophageal reflux disease with esophagitis, without bleeding: Secondary | ICD-10-CM | POA: Diagnosis not present

## 2022-12-24 DIAGNOSIS — J4520 Mild intermittent asthma, uncomplicated: Secondary | ICD-10-CM | POA: Diagnosis not present

## 2022-12-24 DIAGNOSIS — E1065 Type 1 diabetes mellitus with hyperglycemia: Secondary | ICD-10-CM | POA: Diagnosis not present

## 2023-01-17 ENCOUNTER — Ambulatory Visit (INDEPENDENT_AMBULATORY_CARE_PROVIDER_SITE_OTHER): Payer: 59 | Admitting: Gastroenterology

## 2023-01-27 DIAGNOSIS — E039 Hypothyroidism, unspecified: Secondary | ICD-10-CM | POA: Diagnosis not present

## 2023-01-27 DIAGNOSIS — R112 Nausea with vomiting, unspecified: Secondary | ICD-10-CM | POA: Diagnosis not present

## 2023-01-27 DIAGNOSIS — E782 Mixed hyperlipidemia: Secondary | ICD-10-CM | POA: Diagnosis not present

## 2023-01-27 DIAGNOSIS — K2100 Gastro-esophageal reflux disease with esophagitis, without bleeding: Secondary | ICD-10-CM | POA: Diagnosis not present

## 2023-01-27 DIAGNOSIS — J4520 Mild intermittent asthma, uncomplicated: Secondary | ICD-10-CM | POA: Diagnosis not present

## 2023-01-27 DIAGNOSIS — E1165 Type 2 diabetes mellitus with hyperglycemia: Secondary | ICD-10-CM | POA: Diagnosis not present

## 2023-01-27 DIAGNOSIS — I10 Essential (primary) hypertension: Secondary | ICD-10-CM | POA: Diagnosis not present

## 2023-03-11 DIAGNOSIS — Z0001 Encounter for general adult medical examination with abnormal findings: Secondary | ICD-10-CM | POA: Diagnosis not present

## 2023-03-11 DIAGNOSIS — E1165 Type 2 diabetes mellitus with hyperglycemia: Secondary | ICD-10-CM | POA: Diagnosis not present

## 2023-03-14 DIAGNOSIS — E1065 Type 1 diabetes mellitus with hyperglycemia: Secondary | ICD-10-CM | POA: Diagnosis not present

## 2023-03-14 DIAGNOSIS — J4520 Mild intermittent asthma, uncomplicated: Secondary | ICD-10-CM | POA: Diagnosis not present

## 2023-03-14 DIAGNOSIS — R4582 Worries: Secondary | ICD-10-CM | POA: Diagnosis not present

## 2023-03-14 DIAGNOSIS — E039 Hypothyroidism, unspecified: Secondary | ICD-10-CM | POA: Diagnosis not present

## 2023-03-14 DIAGNOSIS — I10 Essential (primary) hypertension: Secondary | ICD-10-CM | POA: Diagnosis not present

## 2023-03-14 DIAGNOSIS — Z0001 Encounter for general adult medical examination with abnormal findings: Secondary | ICD-10-CM | POA: Diagnosis not present

## 2023-03-14 DIAGNOSIS — K2100 Gastro-esophageal reflux disease with esophagitis, without bleeding: Secondary | ICD-10-CM | POA: Diagnosis not present

## 2023-03-14 DIAGNOSIS — G43919 Migraine, unspecified, intractable, without status migrainosus: Secondary | ICD-10-CM | POA: Diagnosis not present

## 2023-03-14 DIAGNOSIS — M542 Cervicalgia: Secondary | ICD-10-CM | POA: Diagnosis not present

## 2023-03-14 DIAGNOSIS — E7849 Other hyperlipidemia: Secondary | ICD-10-CM | POA: Diagnosis not present

## 2023-03-16 ENCOUNTER — Encounter (INDEPENDENT_AMBULATORY_CARE_PROVIDER_SITE_OTHER): Payer: Self-pay | Admitting: *Deleted

## 2023-03-21 ENCOUNTER — Telehealth (INDEPENDENT_AMBULATORY_CARE_PROVIDER_SITE_OTHER): Payer: Self-pay | Admitting: Gastroenterology

## 2023-03-21 ENCOUNTER — Other Ambulatory Visit (HOSPITAL_COMMUNITY): Payer: 59

## 2023-03-21 NOTE — Telephone Encounter (Signed)
[facility] to schedule pt TCS with Ahmed ASA 3

## 2023-03-22 DIAGNOSIS — E1065 Type 1 diabetes mellitus with hyperglycemia: Secondary | ICD-10-CM | POA: Diagnosis not present

## 2023-03-25 ENCOUNTER — Telehealth (INDEPENDENT_AMBULATORY_CARE_PROVIDER_SITE_OTHER): Payer: Self-pay | Admitting: *Deleted

## 2023-03-25 NOTE — Telephone Encounter (Signed)
Patient left vm to call her back about scheduling colonoscopy  [phone]

## 2023-03-28 ENCOUNTER — Telehealth (INDEPENDENT_AMBULATORY_CARE_PROVIDER_SITE_OTHER): Payer: Self-pay | Admitting: Gastroenterology

## 2023-03-28 MED ORDER — PEG 3350-KCL-NA BICARB-NACL 420 G PO SOLR: 4000.0000 mL | Freq: Once | ORAL | 0 refills | Status: DC

## 2023-03-28 NOTE — Telephone Encounter (Signed)
    03/28/23  Kathleen Norris [birth year]  What type of surgery is being performed? COLONOSCOPY  When is surgery scheduled? 04/27/22  CLEARANCE TO HOLD ELIQUIS FOR 2 DAYS PRIOR  Name of physician performing surgery?  Dr. Starleen Arms Gastroenterology at [facility] Phone: [phone] Fax: [phone]  Anethesia type (none, local, MAC, general)? MAC

## 2023-03-28 NOTE — Telephone Encounter (Signed)
Pt contacted. Pt scheduled for 04/28/23. Will need to do PA after first of year. Prep sent to pharmacy. Instructions will be mailed once pre op is received.

## 2023-03-29 NOTE — Telephone Encounter (Signed)
Pt states she is following [facility]. I will update the requesting office they will need to reach out to [facility]. I will remove from the preop call back pool.

## 2023-03-31 NOTE — Telephone Encounter (Signed)
 No PA needed via [facility]

## 2023-04-01 NOTE — Telephone Encounter (Signed)
 Fax from [facility] stating oK to hold Eliquis for two days.

## 2023-04-20 NOTE — Patient Instructions (Signed)
Kathleen Norris  04/20/2023     @PREFPERIOPPHARMACY @   Your procedure is scheduled on  04/28/2023.   Report to [name] at  0700 A.M.   Call this number if you have problems the morning of surgery:  [phone]  If you experience any cold or flu symptoms such as cough, fever, chills, shortness of breath, etc. between now and your scheduled surgery, please notify us at the above number.   Remember:          Your last dose of eliquis and semeglutide should be on 04/25/2023.       Take 1/2 of your usual dose of lantus the night before your procedure.         DO NOT take any medications for diabetes the morning of your procedure.        Use your nebulizer and your inhaler before coming to the hospital and bring your rescue inhaler with you.    Follow the diet and prep instructions given to you by the office.   You may drink clear liquids until 0500 am on 04/28/2023.    Clear liquids allowed are:                    Water, Juice (No red color; non-citric and without pulp; diabetics please choose diet or no sugar options), Carbonated beverages (diabetics please choose diet or no sugar options), Clear Tea (No creamer, milk, or cream, including half & half and powdered creamer), Black Coffee Only (No creamer, milk or cream, including half & half and powdered creamer), and Clear Sports drink (No red color; diabetics please choose diet or no sugar options)    Take these medicines the morning of surgery with A SIP OF WATER           alprazolam (if needed), citalopram, gabapentin, levothyroxine, pantoprazole.     Do not wear jewelry, make-up or nail polish, including gel polish,  artificial nails, or any other type of covering on natural nails (fingers and  toes).  Do not wear lotions, powders, or perfumes, or deodorant.  Do not shave 48 hours prior to surgery.  Men may shave face and neck.  Do not bring valuables to the hospital.  [facility] is not responsible for any  belongings or valuables.  Contacts, dentures or bridgework may not be worn into surgery.  Leave your suitcase in the car.  After surgery it may be brought to your room.  For patients admitted to the hospital, discharge time will be determined by your treatment team.  Patients discharged the day of surgery will not be allowed to drive home and must have someone with them for 24 hours.    Special instructions:   DO NOT smoke tobacco or vape for 24 hours before your procedure.  Please read over the following fact sheets that you were given. Anesthesia Post-op Instructions and Care and Recovery After Surgery      Colonoscopy, Adult, Care After The following information offers guidance on how to care for yourself after your procedure. Your health care provider may also give you more specific instructions. If you have problems or questions, contact your health care provider. What can I expect after the procedure? After the procedure, it is common to have: A small amount of blood in your stool for 24 hours after the procedure. Some gas. Mild cramping or bloating of your abdomen. Follow these instructions at home: Eating and drinking  Drink enough fluid to keep your urine pale yellow. Follow instructions from your health care provider about eating or drinking restrictions. Resume your normal diet as told by your health care provider. Avoid heavy or fried foods that are hard to digest. Activity Rest as told by your health care provider. Avoid sitting for a long time without moving. Get up to take short walks every 1-2 hours. This is important to improve blood flow and breathing. Ask for help if you feel weak or unsteady. Return to your normal activities as told by your health care provider. Ask your health care provider what activities are safe for you. Managing cramping and bloating  Try walking around when you have cramps or feel bloated. If directed, apply heat to your abdomen as told  by your health care provider. Use the heat source that your health care provider recommends, such as a moist heat pack or a heating pad. Place a towel between your skin and the heat source. Leave the heat on for 20-30 minutes. Remove the heat if your skin turns bright red. This is especially important if you are unable to feel pain, heat, or cold. You have a greater risk of getting burned. General instructions If you were given a sedative during the procedure, it can affect you for several hours. Do not drive or operate machinery until your health care provider says that it is safe. For the first 24 hours after the procedure: Do not sign important documents. Do not drink alcohol. Do your regular daily activities at a slower pace than normal. Eat soft foods that are easy to digest. Take over-the-counter and prescription medicines only as told by your health care provider. Keep all follow-up visits. This is important. Contact a health care provider if: You have blood in your stool 2-3 days after the procedure. Get help right away if: You have more than a small spotting of blood in your stool. You have large blood clots in your stool. You have swelling of your abdomen. You have nausea or vomiting. You have a fever. You have increasing pain in your abdomen that is not relieved with medicine. These symptoms may be an emergency. Get help right away. Call 911. Do not wait to see if the symptoms will go away. Do not drive yourself to the hospital. Summary After the procedure, it is common to have a small amount of blood in your stool. You may also have mild cramping and bloating of your abdomen. If you were given a sedative during the procedure, it can affect you for several hours. Do not drive or operate machinery until your health care provider says that it is safe. Get help right away if you have a lot of blood in your stool, nausea or vomiting, a fever, or increased pain in your  abdomen. This information is not intended to replace advice given to you by your health care provider. Make sure you discuss any questions you have with your health care provider. Document Revised: 04/27/2022 Document Reviewed: 11/05/2020 Elsevier Patient Education  2024 [employer]Monitored Anesthesia Care, Care After The following information offers guidance on how to care for yourself after your procedure. Your health care provider may also give you more specific instructions. If you have problems or questions, contact your health care provider. What can I expect after the procedure? After the procedure, it is common to have: Tiredness. Little or no memory about what happened during or after the procedure. Impaired judgment when it comes to making  decisions. Nausea or vomiting. Some trouble with balance. Follow these instructions at home: For the time period you were told by your health care provider:  Rest. Do not participate in activities where you could fall or become injured. Do not drive or use machinery. Do not drink alcohol. Do not take sleeping pills or medicines that cause drowsiness. Do not make important decisions or sign legal documents. Do not take care of children on your own. Medicines Take over-the-counter and prescription medicines only as told by your health care provider. If you were prescribed antibiotics, take them as told by your health care provider. Do not stop using the antibiotic even if you start to feel better. Eating and drinking Follow instructions from your health care provider about what you may eat and drink. Drink enough fluid to keep your urine pale yellow. If you vomit: Drink clear fluids slowly and in small amounts as you are able. Clear fluids include water, ice chips, low-calorie sports drinks, and fruit juice that has water added to it (diluted fruit juice). Eat light and bland foods in small amounts as you are able. These foods include  bananas, applesauce, rice, lean meats, toast, and crackers. General instructions  Have a responsible adult stay with you for the time you are told. It is important to have someone help care for you until you are awake and alert. If you have sleep apnea, surgery and some medicines can increase your risk for breathing problems. Follow instructions from your health care provider about wearing your sleep device: When you are sleeping. This includes during daytime naps. While taking prescription pain medicines, sleeping medicines, or medicines that make you drowsy. Do not use any products that contain nicotine or tobacco. These products include cigarettes, chewing tobacco, and vaping devices, such as e-cigarettes. If you need help quitting, ask your health care provider. Contact a health care provider if: You feel nauseous or vomit every time you eat or drink. You feel light-headed. You are still sleepy or having trouble with balance after 24 hours. You get a rash. You have a fever. You have redness or swelling around the IV site. Get help right away if: You have trouble breathing. You have new confusion after you get home. These symptoms may be an emergency. Get help right away. Call 911. Do not wait to see if the symptoms will go away. Do not drive yourself to the hospital. This information is not intended to replace advice given to you by your health care provider. Make sure you discuss any questions you have with your health care provider. Document Revised: 08/10/2021 Document Reviewed: 08/10/2021 Elsevier Patient Education  2024 [employer].

## 2023-04-21 ENCOUNTER — Encounter (HOSPITAL_COMMUNITY)
Admission: RE | Admit: 2023-04-21 | Discharge: 2023-04-21 | Disposition: A | Discharge status: Home / Self Care | Payer: 59 | Source: Ambulatory Visit | Attending: Gastroenterology | Admitting: Gastroenterology

## 2023-04-21 ENCOUNTER — Telehealth: Payer: Self-pay | Admitting: *Deleted

## 2023-04-21 DIAGNOSIS — E118 Type 2 diabetes mellitus with unspecified complications: Secondary | ICD-10-CM

## 2023-04-21 NOTE — Telephone Encounter (Signed)
-----   Message from Nurse Derrek Monaco sent at 04/21/2023 12:27 PM EST ----- Regarding: patient did not show for PAT visit Patient no show PAT visit today. No answer when called, I left a message for her to reschedule PAT. Colonoscopy on 04/28/23. Thanks! Sherren Kerns, RN

## 2023-04-21 NOTE — Pre-Procedure Instructions (Signed)
 Office messaged because patient was a no show for pre-op.

## 2023-04-25 ENCOUNTER — Encounter (INDEPENDENT_AMBULATORY_CARE_PROVIDER_SITE_OTHER): Payer: Self-pay

## 2023-04-25 ENCOUNTER — Encounter (HOSPITAL_COMMUNITY)
Admission: RE | Admit: 2023-04-25 | Discharge: 2023-04-25 | Disposition: A | Discharge status: Home / Self Care | Payer: 59 | Source: Ambulatory Visit | Attending: Gastroenterology | Admitting: Gastroenterology

## 2023-04-25 DIAGNOSIS — Z01812 Encounter for preprocedural laboratory examination: Secondary | ICD-10-CM | POA: Diagnosis not present

## 2023-04-25 DIAGNOSIS — E11649 Type 2 diabetes mellitus with hypoglycemia without coma: Secondary | ICD-10-CM | POA: Insufficient documentation

## 2023-04-25 DIAGNOSIS — E118 Type 2 diabetes mellitus with unspecified complications: Secondary | ICD-10-CM | POA: Diagnosis not present

## 2023-04-25 DIAGNOSIS — Z01818 Encounter for other preprocedural examination: Secondary | ICD-10-CM | POA: Diagnosis present

## 2023-04-25 LAB — BASIC METABOLIC PANEL
Anion gap: 8 (ref 5–15)
BUN: 13 mg/dL (ref 6–20)
CO2: 25 mmol/L (ref 22–32)
Calcium: 8.9 mg/dL (ref 8.9–10.3)
Chloride: 108 mmol/L (ref 98–111)
Creatinine, Ser: 0.59 mg/dL (ref 0.44–1.00)
GFR, Estimated: >60 mL/min (ref >60)
Glucose, Bld: 41 mg/dL — CL (ref 70–99)
Potassium: 3.4 mmol/L — ABNORMAL LOW (ref 3.5–5.1)
Sodium: 141 mmol/L (ref 135–145)

## 2023-04-25 NOTE — Progress Notes (Signed)
We will need to cancel and reschedule the Colonoscopy for 04/28/23.  Patient is taking Mounjaro and the last dose was on Friday.  It wasn't on her med list so I added it today.

## 2023-04-25 NOTE — Pre-Procedure Instructions (Signed)
Lab calls critical glucose of 41. I attempted to call patient know and check on her and had to leave a message. I asked her to call us back when she got the message. I attempted to contact her husband as well and his phone was not in service. I also contacted the office to let them know as well and they will help me try and contact her.

## 2023-04-25 NOTE — Progress Notes (Signed)
Hi Kathleen Norris ,  Can you please call the patient and tell the patient the lab work  showed low glucose. Please ask her if she is having dizziness, blurry vision . Please ask her to take juice containing sugars now and recheck her blood glucose, if it continues to be low to go to the ED/PCP  =============   Hi [name]: can you please reschedule this  as Patient is taking Mounjaro and the last dose was on Friday.    Thanks,  Vista Lawman, MD Gastroenterology and Hepatology [facility] Gastroenterology

## 2023-04-28 ENCOUNTER — Encounter (HOSPITAL_COMMUNITY): Admission: RE | Payer: Self-pay | Source: Home / Self Care

## 2023-04-28 ENCOUNTER — Ambulatory Visit (HOSPITAL_COMMUNITY): Admission: RE | Admit: 2023-04-28 | Payer: 59 | Source: Home / Self Care | Admitting: Gastroenterology

## 2023-04-28 SURGERY — COLONOSCOPY WITH PROPOFOL
Anesthesia: Monitor Anesthesia Care

## 2023-05-19 DIAGNOSIS — E782 Mixed hyperlipidemia: Secondary | ICD-10-CM | POA: Diagnosis not present

## 2023-05-19 DIAGNOSIS — E1065 Type 1 diabetes mellitus with hyperglycemia: Secondary | ICD-10-CM | POA: Diagnosis not present

## 2023-05-19 DIAGNOSIS — R112 Nausea with vomiting, unspecified: Secondary | ICD-10-CM | POA: Diagnosis not present

## 2023-05-19 DIAGNOSIS — Z794 Long term (current) use of insulin: Secondary | ICD-10-CM | POA: Diagnosis not present

## 2023-05-30 ENCOUNTER — Other Ambulatory Visit: Payer: Self-pay

## 2023-05-30 ENCOUNTER — Encounter (HOSPITAL_COMMUNITY): Payer: Self-pay

## 2023-05-30 ENCOUNTER — Other Ambulatory Visit (INDEPENDENT_AMBULATORY_CARE_PROVIDER_SITE_OTHER): Payer: Self-pay

## 2023-05-30 MED ORDER — PEG 3350-KCL-NA BICARB-NACL 420 G PO SOLR: 4000.0000 mL | Freq: Once | ORAL | 0 refills | Status: AC

## 2023-05-31 ENCOUNTER — Encounter (HOSPITAL_COMMUNITY)
Admission: RE | Admit: 2023-05-31 | Discharge: 2023-05-31 | Disposition: A | Discharge status: Home / Self Care | Payer: 59 | Source: Ambulatory Visit | Attending: Gastroenterology | Admitting: Gastroenterology

## 2023-05-31 HISTORY — DX: Unspecified atrial fibrillation: I48.91

## 2023-06-02 ENCOUNTER — Ambulatory Visit (HOSPITAL_COMMUNITY): Admitting: Anesthesiology

## 2023-06-02 ENCOUNTER — Ambulatory Visit (HOSPITAL_COMMUNITY)
Admission: RE | Admit: 2023-06-02 | Discharge: 2023-06-02 | Disposition: A | Discharge status: Home / Self Care | Payer: 59 | Attending: Gastroenterology | Admitting: Gastroenterology

## 2023-06-02 ENCOUNTER — Encounter (HOSPITAL_COMMUNITY): Payer: Self-pay | Admitting: Gastroenterology

## 2023-06-02 ENCOUNTER — Encounter (INDEPENDENT_AMBULATORY_CARE_PROVIDER_SITE_OTHER): Payer: Self-pay | Admitting: *Deleted

## 2023-06-02 ENCOUNTER — Encounter (HOSPITAL_COMMUNITY)
Admission: RE | Disposition: A | Discharge status: Home / Self Care | Payer: Self-pay | Source: Home / Self Care | Attending: Gastroenterology

## 2023-06-02 ENCOUNTER — Other Ambulatory Visit: Payer: Self-pay

## 2023-06-02 DIAGNOSIS — Z7985 Long-term (current) use of injectable non-insulin antidiabetic drugs: Secondary | ICD-10-CM | POA: Diagnosis not present

## 2023-06-02 DIAGNOSIS — Z79899 Other long term (current) drug therapy: Secondary | ICD-10-CM | POA: Diagnosis not present

## 2023-06-02 DIAGNOSIS — E039 Hypothyroidism, unspecified: Secondary | ICD-10-CM | POA: Diagnosis not present

## 2023-06-02 DIAGNOSIS — Z1211 Encounter for screening for malignant neoplasm of colon: Secondary | ICD-10-CM | POA: Insufficient documentation

## 2023-06-02 DIAGNOSIS — Z794 Long term (current) use of insulin: Secondary | ICD-10-CM | POA: Diagnosis not present

## 2023-06-02 DIAGNOSIS — Z79890 Hormone replacement therapy (postmenopausal): Secondary | ICD-10-CM | POA: Insufficient documentation

## 2023-06-02 DIAGNOSIS — Z800 Family history of malignant neoplasm of digestive organs: Secondary | ICD-10-CM | POA: Insufficient documentation

## 2023-06-02 DIAGNOSIS — K648 Other hemorrhoids: Secondary | ICD-10-CM | POA: Diagnosis not present

## 2023-06-02 DIAGNOSIS — I4891 Unspecified atrial fibrillation: Secondary | ICD-10-CM | POA: Insufficient documentation

## 2023-06-02 DIAGNOSIS — J45909 Unspecified asthma, uncomplicated: Secondary | ICD-10-CM | POA: Diagnosis not present

## 2023-06-02 DIAGNOSIS — K644 Residual hemorrhoidal skin tags: Secondary | ICD-10-CM | POA: Insufficient documentation

## 2023-06-02 DIAGNOSIS — E119 Type 2 diabetes mellitus without complications: Secondary | ICD-10-CM | POA: Diagnosis not present

## 2023-06-02 DIAGNOSIS — R519 Headache, unspecified: Secondary | ICD-10-CM | POA: Insufficient documentation

## 2023-06-02 DIAGNOSIS — K641 Second degree hemorrhoids: Secondary | ICD-10-CM

## 2023-06-02 DIAGNOSIS — K219 Gastro-esophageal reflux disease without esophagitis: Secondary | ICD-10-CM | POA: Insufficient documentation

## 2023-06-02 DIAGNOSIS — I10 Essential (primary) hypertension: Secondary | ICD-10-CM | POA: Insufficient documentation

## 2023-06-02 DIAGNOSIS — Z7901 Long term (current) use of anticoagulants: Secondary | ICD-10-CM | POA: Insufficient documentation

## 2023-06-02 DIAGNOSIS — Z833 Family history of diabetes mellitus: Secondary | ICD-10-CM | POA: Diagnosis not present

## 2023-06-02 DIAGNOSIS — F419 Anxiety disorder, unspecified: Secondary | ICD-10-CM | POA: Insufficient documentation

## 2023-06-02 HISTORY — PX: COLONOSCOPY WITH PROPOFOL: SHX5780

## 2023-06-02 LAB — GLUCOSE, CAPILLARY
Glucose-Capillary: 152 mg/dL — ABNORMAL HIGH (ref 70–99)
Glucose-Capillary: 131 mg/dL — ABNORMAL HIGH (ref 70–99)
Glucose-Capillary: 115 mg/dL — ABNORMAL HIGH (ref 70–99)

## 2023-06-02 LAB — HM COLONOSCOPY

## 2023-06-02 SURGERY — COLONOSCOPY WITH PROPOFOL
Anesthesia: General

## 2023-06-02 MED ORDER — LIDOCAINE HCL (CARDIAC) PF 100 MG/5ML IV SOSY
PREFILLED_SYRINGE | INTRAVENOUS | Status: DC | PRN
  Administered 2023-06-02: 50 mg via INTRATRACHEAL

## 2023-06-02 MED ORDER — PROPOFOL 500 MG/50ML IV EMUL
INTRAVENOUS | Status: DC | PRN
  Administered 2023-06-02: 150 ug/kg/min via INTRAVENOUS

## 2023-06-02 MED ORDER — LACTATED RINGERS IV SOLN: INTRAVENOUS | Status: DC

## 2023-06-02 MED ORDER — PROPOFOL 10 MG/ML IV BOLUS
INTRAVENOUS | Status: DC | PRN
Start: 2023-06-02 — End: 2023-06-02
  Administered 2023-06-02: 80 mg via INTRAVENOUS
  Administered 2023-06-02: 30 mg via INTRAVENOUS
  Administered 2023-06-02: 20 mg via INTRAVENOUS

## 2023-06-02 MED ORDER — PRUCALOPRIDE SUCCINATE 2 MG PO TABS: 2.0000 mg | ORAL_TABLET | Freq: Every day | ORAL | 2 refills | Status: AC

## 2023-06-02 NOTE — H&P (Signed)
 Primary Care Physician:  Richardean Chimera, MD Primary Gastroenterologist:  Dr. Tasia Catchings  Pre-Procedure History & Physical: HPI:  Kathleen Norris is a 58 y.o. female is here for a colonoscopy for colon cancer screening purposes.  Colon cancer in patient's mother around age 67,    No melena or hematochezia.  No abdominal pain or unintentional weight loss.  No change in bowel habits.  Overall feels well from a GI standpoint.  Last colonoscopy 07/224 with poor prep   Past Medical History:  Diagnosis Date   Arthritis    Asthma    Atrial fibrillation (HCC)    Chronic anxiety    Chronic pain    Diabetes mellitus    Diagnosed 1997   Diabetes with ketoacidosis    Dysrhythmia    GERD (gastroesophageal reflux disease)    Nausea and vomiting 10/07/2022   Sinusitis    SVT (supraventricular tachycardia) (HCC)    Ablation per Dr. Ladona Ridgel in November 2005   URI (upper respiratory infection)     Past Surgical History:  Procedure Laterality Date   ANTERIOR CERVICAL DECOMP/DISCECTOMY FUSION N/A 09/03/2016   Procedure: ANTERIOR CERVICAL DECOMPRESSION FUSION  - CERVICAL FOUR-FIVE - CERVICAL FIVE-SIX;  Surgeon: Julio Sicks, MD;  Location: [facility] OR;  Service: Neurosurgery;  Laterality: N/A;   BIOPSY  10/14/2022   Procedure: BIOPSY;  Surgeon: Franky Macho, MD;  Location: [facility] ENDO SUITE;  Service: Endoscopy;;  upper and lower   CHOLECYSTECTOMY     COLONOSCOPY WITH PROPOFOL N/A 10/14/2022   Procedure: COLONOSCOPY WITH PROPOFOL;  Surgeon: Franky Macho, MD;  Location: [facility] SUITE;  Service: Endoscopy;  Laterality: N/A;  12:45pm;asa 3   ESOPHAGOGASTRODUODENOSCOPY (EGD) WITH PROPOFOL N/A 10/14/2022   Procedure: ESOPHAGOGASTRODUODENOSCOPY (EGD) WITH PROPOFOL;  Surgeon: Franky Macho, MD;  Location: [facility];  Service: Endoscopy;  Laterality: N/A;  12:45pm;asa 3   LEFT HEART CATHETERIZATION WITH CORONARY ANGIOGRAM N/A 02/16/2011   Procedure: LEFT HEART CATHETERIZATION WITH CORONARY ANGIOGRAM;   Surgeon: Kathleene Hazel, MD;  Location: [facility] CATH LAB;  Service: Cardiovascular;  Laterality: N/A;   TUBAL LIGATION      Prior to Admission medications   Medication Sig Start Date End Date Taking? Authorizing Provider  albuterol (PROVENTIL HFA;VENTOLIN HFA) 108 (90 BASE) MCG/ACT inhaler Inhale 2 puffs into the lungs every 6 (six) hours as needed for wheezing. 07/12/13  Yes Daphine Deutscher, Mary-Margaret, FNP  ALPRAZolam Prudy Feeler) 0.5 MG tablet TAKE ONE TABLET TWICE DAILY AS NEEDED   Yes Daphine Deutscher, Mary-Margaret, FNP  atorvastatin (LIPITOR) 80 MG tablet Take 80 mg by mouth daily.   Yes [provider]  citalopram (CELEXA) 20 MG tablet Take 20 mg by mouth daily.   Yes [provider]  ELIQUIS 5 MG TABS tablet Take 1 tablet by mouth 2 (two) times daily. 07/28/20  Yes [provider]  gabapentin (NEURONTIN) 300 MG capsule Take 300 mg by mouth 3 (three) times daily.   Yes [provider]  ibuprofen (ADVIL,MOTRIN) 200 MG tablet Take 400 mg by mouth every 6 (six) hours as needed for mild pain or moderate pain.   Yes [provider]  insulin glargine (LANTUS) 100 UNIT/ML injection Inject 20 Units into the skin at bedtime. As of 04/08/20 pt taking 20 units daily   Yes [provider]  insulin lispro (HUMALOG) 100 UNIT/ML injection Inject 0.02-0.06 mLs (2-6 Units total) into the skin 3 (three) times daily before meals. Pens, please. 04/08/20  Yes Carlus Pavlov, MD  ipratropium-albuterol (DUONEB)  0.5-2.5 (3) MG/3ML SOLN Take 3 mLs by nebulization every 4 (four) hours as needed. 07/25/20  Yes [provider]  levothyroxine (SYNTHROID) 150 MCG tablet Take 150 mcg by mouth daily. 07/31/20  Yes [provider]  lisinopril (ZESTRIL) 10 MG tablet Take 1 tablet by mouth daily. 07/28/20  Yes [provider]  pantoprazole (PROTONIX) 40 MG tablet Take 1 tablet (40 mg total) by mouth 2 (two) times daily. 10/07/22  Yes Carlan, Chelsea L, NP  Blood Glucose  Monitoring Suppl (BAYER CONTOUR MONITOR) W/DEVICE KIT by Does not apply route.    [provider]  Continuous Blood Gluc Transmit (DEXCOM G6 TRANSMITTER) MISC 1 Device by Does not apply route every 3 (three) months. 04/08/20   Carlus Pavlov, MD  Glucagon 3 MG/DOSE POWD Place 3 mg into the nose once as needed for up to 1 dose. Patient not taking: Reported on 10/07/2022 04/08/20   Carlus Pavlov, MD  glucose blood test strip 1 each by Other route 3 (three) times daily. Use as instructed    [provider]  PRODIGY LANCETS 21G MISC by Does not apply route 3 (three) times daily.    [provider]  Semaglutide (RYBELSUS) 14 MG TABS Take by mouth daily.    [provider]  tirzepatide Greggory Keen) 2.5 MG/0.5ML Pen Inject 2.5 mg into the skin once a week. Friday    [provider]    Allergies as of 04/26/2023   (No Known Allergies)    Family History  Problem Relation Age of Onset   Heart disease Maternal Uncle    Diabetes Mother    Cancer Mother     Social History   Socioeconomic History   Marital status: Married    Spouse name: Not on file   Number of children: 4   Years of education: Not on file   Highest education level: Not on file  Occupational History   Occupation: Unemployed-Disability  Tobacco Use   Smoking status: Never    Passive exposure: Current   Smokeless tobacco: Never  Substance and Sexual Activity   Alcohol use: No   Drug use: No   Sexual activity: Not on file  Other Topics Concern   Not on file  Social History Narrative   Not on file   Social Drivers of Health   Financial Resource Strain: Not on file  Food Insecurity: Not on file  Transportation Needs: Not on file  Physical Activity: Not on file  Stress: Not on file  Social Connections: Not on file  Intimate Partner Violence: Not on file    Review of Systems: See HPI, otherwise negative ROS  Physical Exam: Vital signs in last 24 hours: Temp:  [98  F (36.7 C)] 98 F (36.7 C) (03/06 0838) Pulse Rate:  [68] 68 (03/06 0838) Resp:  [12] 12 (03/06 0838) BP: (150)/(77) 150/77 (03/06 0838) SpO2:  [100 %] 100 % (03/06 0838) Weight:  [84.4 kg] 84.4 kg (03/06 0838)   General:   Alert,  Well-developed, well-nourished, pleasant and cooperative in NAD Head:  Normocephalic and atraumatic. Eyes:  Sclera clear, no icterus.   Conjunctiva pink. Ears:  Normal auditory acuity. Nose:  No deformity, discharge,  or lesions. Msk:  Symmetrical without gross deformities. Normal posture. Extremities:  Without clubbing or edema. Neurologic:  Alert and  oriented x4;  grossly normal neurologically. Skin:  Intact without significant lesions or rashes. Psych:  Alert and cooperative. Normal mood and affect.  Impression/Plan: Kathleen Norris is here  for a colonoscopy to be performed for colon cancer screening purposes.  The risks of the procedure including infection, bleed, or perforation as well as benefits, limitations, alternatives and imponderables have been reviewed with the patient. Questions have been answered. All parties agreeable.

## 2023-06-02 NOTE — Anesthesia Postprocedure Evaluation (Signed)
 Anesthesia Post Note  Patient: Kathleen Norris  Procedure(s) Performed: COLONOSCOPY WITH PROPOFOL  Patient location during evaluation: Short Stay Anesthesia Type: General Level of consciousness: awake Pain management: pain level controlled Vital Signs Assessment: post-procedure vital signs reviewed and stable Respiratory status: spontaneous breathing Cardiovascular status: blood pressure returned to baseline and stable Postop Assessment: no apparent nausea or vomiting Anesthetic complications: no   No notable events documented.   Last Vitals:  Vitals:   06/02/23 0838  BP: (!) 150/77  Pulse: 68  Resp: 12  Temp: 36.7 C  SpO2: 100%    Last Pain:  Vitals:   06/02/23 0937  TempSrc:   PainSc: 0-No pain                 Javoris Star

## 2023-06-02 NOTE — Anesthesia Preprocedure Evaluation (Signed)
 Anesthesia Evaluation  Patient identified by MRN, date of birth, ID band Patient awake    Reviewed: Allergy & Precautions, H&P , NPO status , Patient's Chart, lab work & pertinent test results, reviewed documented beta blocker date and time   Airway Mallampati: II  TM Distance: >3 FB Neck ROM: full    Dental no notable dental hx.    Pulmonary neg pulmonary ROS, asthma    Pulmonary exam normal breath sounds clear to auscultation       Cardiovascular Exercise Tolerance: Good hypertension, negative cardio ROS + dysrhythmias  Rhythm:regular Rate:Normal     Neuro/Psych  Headaches  Anxiety      Neuromuscular disease negative neurological ROS  negative psych ROS   GI/Hepatic negative GI ROS, Neg liver ROS,GERD  ,,  Endo/Other  negative endocrine ROSdiabetesHypothyroidism    Renal/GU negative Renal ROS  negative genitourinary   Musculoskeletal   Abdominal   Peds  Hematology negative hematology ROS (+)   Anesthesia Other Findings   Reproductive/Obstetrics negative OB ROS                             Anesthesia Physical Anesthesia Plan  ASA: 2  Anesthesia Plan: General   Post-op Pain Management:    Induction:   PONV Risk Score and Plan: Propofol infusion  Airway Management Planned:   Additional Equipment:   Intra-op Plan:   Post-operative Plan:   Informed Consent: I have reviewed the patients History and Physical, chart, labs and discussed the procedure including the risks, benefits and alternatives for the proposed anesthesia with the patient or authorized representative who has indicated his/her understanding and acceptance.     Dental Advisory Given  Plan Discussed with: CRNA  Anesthesia Plan Comments:        Anesthesia Quick Evaluation

## 2023-06-02 NOTE — Transfer of Care (Signed)
 Immediate Anesthesia Transfer of Care Note  Patient: Kathleen Norris  Procedure(s) Performed: COLONOSCOPY WITH PROPOFOL  Patient Location: Short Stay  Anesthesia Type:General  Level of Consciousness: awake  Airway & Oxygen Therapy: Patient Spontanous Breathing  Post-op Assessment: Report given to RN  Post vital signs: Reviewed  Last Vitals:  Vitals Value Taken Time  BP    Temp    Pulse 67 06/02/23 1001  Resp 15 06/02/23 1001  SpO2 100 % 06/02/23 1001  Vitals shown include unfiled device data.  Last Pain:  Vitals:   06/02/23 0937  TempSrc:   PainSc: 0-No pain         Complications: No notable events documented.

## 2023-06-02 NOTE — Progress Notes (Signed)
 Called pt to see if she is coming she states, the prep just started working., particles in my stool.  Told to call office to reschedule.   She states blood sugar low, told to drink liquid with sugar and told and if it doesn't come up go to emergency room, pt verbalized understanding

## 2023-06-02 NOTE — Progress Notes (Signed)
 Pt called. Pt stated that she had drank all of her prep. Instructed to come to hospital for procedure. Voiced understanding. Pt is on her way.

## 2023-06-02 NOTE — Progress Notes (Signed)
 Dr. Tasia Catchings notified pt's prep started working at 0630 this AM. Pt still passing stool. Dr Tasia Catchings instructed to call pt in for procedure if she drank all of her prep.

## 2023-06-02 NOTE — Op Note (Signed)
 [facility] Patient Name: Kathleen Norris Procedure Date: 06/02/2023 9:10 AM MRN: [id number] Date of Birth: Nov 01, 1965 Attending MD: Sanjuan Dame , MD, [id number] CSN: [id number] Age: 58 Admit Type: Outpatient Procedure:                Colonoscopy Indications:              Screening patient at increased risk: Family history                            of 1st-degree relative with colorectal cancer at                            age 68 years (or older) Providers:                Sanjuan Dame, MD, Angelica Ran, Dyann Ruddle Referring MD:              Medicines:                Monitored Anesthesia Care Complications:            No immediate complications. Estimated Blood Loss:     Estimated blood loss: none. Procedure:                Pre-Anesthesia Assessment:                           - Prior to the procedure, a History and Physical                            was performed, and patient medications and                            allergies were reviewed. The patient's tolerance of                            previous anesthesia was also reviewed. The risks                            and benefits of the procedure and the sedation                            options and risks were discussed with the patient.                            All questions were answered, and informed consent                            was obtained. Prior Anticoagulants: The patient has                            taken Eliquis (apixaban), last dose was 2 days                            prior to procedure. ASA Grade Assessment: III - A  patient with severe systemic disease. After                            reviewing the risks and benefits, the patient was                            deemed in satisfactory condition to undergo the                            procedure.                           After obtaining informed consent, the colonoscope                            was passed under direct  vision. Throughout the                            procedure, the patient's blood pressure, pulse, and                            oxygen saturations were monitored continuously. The                            [id number]) scope was introduced through the                            anus and advanced to the the cecum, identified by                            appendiceal orifice and ileocecal valve. The                            colonoscopy was performed without difficulty. The                            patient tolerated the procedure well. The quality                            of the bowel preparation was evaluated using the                            BBPS [facility] Bowel Preparation Scale) with scores                            of: Right Colon = 1 (portion of mucosa seen, but                            other areas not well seen due to staining, residual                            stool and/or opaque liquid), Transverse Colon = 2                            (  minor amount of residual staining, small fragments                            of stool and/or opaque liquid, but mucosa seen                            well) and Left Colon = 2 (minor amount of residual                            staining, small fragments of stool and/or opaque                            liquid, but mucosa seen well). The total BBPS score                            equals 5. The quality of the bowel preparation was                            inadequate. The ileocecal valve, appendiceal                            orifice, and rectum were photographed. Scope In: 9:42:49 AM Scope Out: 9:55:15 AM Scope Withdrawal Time: 0 hours 7 minutes 55 seconds  Total Procedure Duration: 0 hours 12 minutes 26 seconds  Findings:      A large amount of stool was found in the entire colon, precluding       visualization. Lavage of the area was performed using a large amount of       sterile water, resulting in incomplete clearance with  continued poor       visualization.      There is no endoscopic evidence of bleeding or mass in the entire colon.      Non-bleeding external and internal hemorrhoids were found during       retroflexion. The hemorrhoids were small. Impression:               - Preparation of the colon was inadequate.                           - Stool in the entire examined colon.                           - Non-bleeding external and internal hemorrhoids.                           - No specimens collected. Moderate Sedation:      Per Anesthesia Care Recommendation:           - Patient has a contact number available for                            emergencies. The signs and symptoms of potential                            delayed complications were discussed with the  patient. Return to normal activities tomorrow.                            Written discharge instructions were provided to the                            patient.                           - Gastroparesis diet.                           - Continue present medications.                           - Repeat colonoscopy in 6 months because the bowel                            preparation was suboptimal.                           - Return to GI clinic at appointment to be                            scheduled.                           -This is patient second colonoscopy which was poor                            prep . kast time she did not adhere to liquid diet                            prior to the procedure on further questioning                            today, this time patient completed the prep with                            CLD the day prior and continues to have poor prep.                            Will start patient on Prucalopride for chronic                            idiopathic constipation and possiblye gastroparesis                            given diabetes and nausea.                           -Will  consider obtaining GES                           -Next colonoscopy would recommend 2 day CLD, 10  days of Miralax BID and Linzess or Prucalopride Procedure Code(s):        --- Professional ---                           G0105, Colorectal cancer screening; colonoscopy on                            individual at high risk Diagnosis Code(s):        --- Professional ---                           Z80.0, Family history of malignant neoplasm of                            digestive organs                           K64.8, Other hemorrhoids CPT copyright 2022 [employer]. All rights reserved. The codes documented in this report are preliminary and upon coder review may  be revised to meet current compliance requirements. Sanjuan Dame, MD Sanjuan Dame, MD 06/02/2023 10:06:39 AM This report has been signed electronically. Number of Addenda: 0

## 2023-06-03 ENCOUNTER — Encounter (HOSPITAL_COMMUNITY): Payer: Self-pay | Admitting: Gastroenterology

## 2023-06-14 DIAGNOSIS — Z794 Long term (current) use of insulin: Secondary | ICD-10-CM | POA: Diagnosis not present

## 2023-06-14 DIAGNOSIS — E1165 Type 2 diabetes mellitus with hyperglycemia: Secondary | ICD-10-CM | POA: Diagnosis not present

## 2023-06-15 DIAGNOSIS — E1165 Type 2 diabetes mellitus with hyperglycemia: Secondary | ICD-10-CM | POA: Diagnosis not present

## 2023-07-19 DIAGNOSIS — Z1329 Encounter for screening for other suspected endocrine disorder: Secondary | ICD-10-CM | POA: Diagnosis not present

## 2023-07-19 DIAGNOSIS — E1010 Type 1 diabetes mellitus with ketoacidosis without coma: Secondary | ICD-10-CM | POA: Diagnosis not present

## 2023-07-19 DIAGNOSIS — Z0000 Encounter for general adult medical examination without abnormal findings: Secondary | ICD-10-CM | POA: Diagnosis not present

## 2023-07-19 DIAGNOSIS — Z13220 Encounter for screening for lipoid disorders: Secondary | ICD-10-CM | POA: Diagnosis not present

## 2023-07-19 DIAGNOSIS — E162 Hypoglycemia, unspecified: Secondary | ICD-10-CM | POA: Diagnosis not present

## 2023-07-25 DIAGNOSIS — Z1231 Encounter for screening mammogram for malignant neoplasm of breast: Secondary | ICD-10-CM | POA: Diagnosis not present

## 2023-07-26 DIAGNOSIS — E559 Vitamin D deficiency, unspecified: Secondary | ICD-10-CM | POA: Diagnosis not present

## 2023-07-26 DIAGNOSIS — K2100 Gastro-esophageal reflux disease with esophagitis, without bleeding: Secondary | ICD-10-CM | POA: Diagnosis not present

## 2023-07-26 DIAGNOSIS — J301 Allergic rhinitis due to pollen: Secondary | ICD-10-CM | POA: Diagnosis not present

## 2023-07-26 DIAGNOSIS — E782 Mixed hyperlipidemia: Secondary | ICD-10-CM | POA: Diagnosis not present

## 2023-08-02 ENCOUNTER — Ambulatory Visit (INDEPENDENT_AMBULATORY_CARE_PROVIDER_SITE_OTHER): Admitting: Gastroenterology

## 2023-09-16 DIAGNOSIS — E1165 Type 2 diabetes mellitus with hyperglycemia: Secondary | ICD-10-CM | POA: Diagnosis not present

## 2023-09-27 DIAGNOSIS — E782 Mixed hyperlipidemia: Secondary | ICD-10-CM | POA: Diagnosis not present

## 2023-09-27 DIAGNOSIS — I10 Essential (primary) hypertension: Secondary | ICD-10-CM | POA: Diagnosis not present

## 2023-09-27 DIAGNOSIS — E1065 Type 1 diabetes mellitus with hyperglycemia: Secondary | ICD-10-CM | POA: Diagnosis not present

## 2023-11-09 ENCOUNTER — Encounter (INDEPENDENT_AMBULATORY_CARE_PROVIDER_SITE_OTHER): Payer: Self-pay | Admitting: *Deleted

## 2023-11-29 DIAGNOSIS — R1110 Vomiting, unspecified: Secondary | ICD-10-CM | POA: Diagnosis not present

## 2023-11-29 DIAGNOSIS — E1042 Type 1 diabetes mellitus with diabetic polyneuropathy: Secondary | ICD-10-CM | POA: Diagnosis not present

## 2023-11-29 DIAGNOSIS — E1165 Type 2 diabetes mellitus with hyperglycemia: Secondary | ICD-10-CM | POA: Diagnosis not present

## 2023-11-29 DIAGNOSIS — R109 Unspecified abdominal pain: Secondary | ICD-10-CM | POA: Diagnosis not present

## 2023-11-29 DIAGNOSIS — R1032 Left lower quadrant pain: Secondary | ICD-10-CM | POA: Diagnosis not present

## 2023-11-29 DIAGNOSIS — E782 Mixed hyperlipidemia: Secondary | ICD-10-CM | POA: Diagnosis not present

## 2023-11-29 DIAGNOSIS — R1031 Right lower quadrant pain: Secondary | ICD-10-CM | POA: Diagnosis not present

## 2023-12-01 DIAGNOSIS — R1031 Right lower quadrant pain: Secondary | ICD-10-CM | POA: Diagnosis not present

## 2023-12-01 DIAGNOSIS — R300 Dysuria: Secondary | ICD-10-CM | POA: Diagnosis not present

## 2023-12-01 DIAGNOSIS — E162 Hypoglycemia, unspecified: Secondary | ICD-10-CM | POA: Diagnosis not present

## 2023-12-01 DIAGNOSIS — M25551 Pain in right hip: Secondary | ICD-10-CM | POA: Diagnosis not present

## 2023-12-22 DIAGNOSIS — E1165 Type 2 diabetes mellitus with hyperglycemia: Secondary | ICD-10-CM | POA: Diagnosis not present

## 2023-12-27 DIAGNOSIS — Z1329 Encounter for screening for other suspected endocrine disorder: Secondary | ICD-10-CM | POA: Diagnosis not present

## 2023-12-27 DIAGNOSIS — K2100 Gastro-esophageal reflux disease with esophagitis, without bleeding: Secondary | ICD-10-CM | POA: Diagnosis not present

## 2023-12-27 DIAGNOSIS — E782 Mixed hyperlipidemia: Secondary | ICD-10-CM | POA: Diagnosis not present

## 2023-12-27 DIAGNOSIS — E559 Vitamin D deficiency, unspecified: Secondary | ICD-10-CM | POA: Diagnosis not present

## 2023-12-27 DIAGNOSIS — E7849 Other hyperlipidemia: Secondary | ICD-10-CM | POA: Diagnosis not present

## 2023-12-27 DIAGNOSIS — J301 Allergic rhinitis due to pollen: Secondary | ICD-10-CM | POA: Diagnosis not present

## 2023-12-27 DIAGNOSIS — Z23 Encounter for immunization: Secondary | ICD-10-CM | POA: Diagnosis not present

## 2023-12-27 DIAGNOSIS — E1065 Type 1 diabetes mellitus with hyperglycemia: Secondary | ICD-10-CM | POA: Diagnosis not present

## 2023-12-28 ENCOUNTER — Encounter: Payer: Self-pay | Admitting: Neurology

## 2023-12-28 ENCOUNTER — Other Ambulatory Visit: Payer: Self-pay

## 2023-12-28 DIAGNOSIS — R202 Paresthesia of skin: Secondary | ICD-10-CM

## 2024-02-15 NOTE — Progress Notes (Signed)
 Kathleen Norris                                          MRN: [id number]   02/15/2024   The [facility] Quality Team Specialist reviewed this patient medical record for the purposes of chart review for care gap closure. The following were reviewed: chart review for care gap closure-glycemic status assessment.    [facility] Quality Team

## 2024-03-08 ENCOUNTER — Encounter: Admitting: Neurology

## 2024-03-08 ENCOUNTER — Encounter: Payer: Self-pay | Admitting: Neurology

## 2024-03-15 ENCOUNTER — Encounter: Admitting: Neurology

## 2024-04-26 NOTE — Progress Notes (Signed)
 Kathleen Norris                                          MRN: [id number]   04/26/2024   The [facility] Quality Team Specialist reviewed this patient medical record for the purposes of chart review for care gap closure. The following were reviewed: chart review for care gap closure-glycemic status assessment.    [facility] Quality Team

## 2024-05-01 ENCOUNTER — Ambulatory Visit: Admitting: Cardiology
